# Patient Record
Sex: Female | Born: 1935
Health system: Southern US, Community
[De-identification: ages and names within clinical notes are randomized; demographics above are authoritative.]

## PROBLEM LIST (undated history)

## (undated) DIAGNOSIS — K5790 Diverticulosis of intestine, part unspecified, without perforation or abscess without bleeding: Secondary | ICD-10-CM

## (undated) DIAGNOSIS — I10 Essential (primary) hypertension: Secondary | ICD-10-CM

## (undated) DIAGNOSIS — K922 Gastrointestinal hemorrhage, unspecified: Secondary | ICD-10-CM

## (undated) DIAGNOSIS — K649 Unspecified hemorrhoids: Secondary | ICD-10-CM

## (undated) DIAGNOSIS — D649 Anemia, unspecified: Secondary | ICD-10-CM

## (undated) HISTORY — PX: CHOLECYSTECTOMY: SHX55

---

## 2001-03-13 ENCOUNTER — Ambulatory Visit (HOSPITAL_COMMUNITY): Admission: RE | Admit: 2001-03-13 | Discharge: 2001-03-13 | Payer: Self-pay | Admitting: Family Medicine

## 2001-03-13 ENCOUNTER — Encounter: Payer: Self-pay | Admitting: Family Medicine

## 2001-03-14 ENCOUNTER — Encounter: Payer: Self-pay | Admitting: Family Medicine

## 2001-03-14 ENCOUNTER — Ambulatory Visit (HOSPITAL_COMMUNITY): Admission: RE | Admit: 2001-03-14 | Discharge: 2001-03-14 | Payer: Self-pay | Admitting: Family Medicine

## 2002-11-29 ENCOUNTER — Inpatient Hospital Stay (HOSPITAL_COMMUNITY): Admission: EM | Admit: 2002-11-29 | Discharge: 2002-12-02 | Payer: Self-pay | Admitting: Internal Medicine

## 2002-11-29 ENCOUNTER — Encounter: Payer: Self-pay | Admitting: Internal Medicine

## 2002-11-30 ENCOUNTER — Encounter: Payer: Self-pay | Admitting: Cardiovascular Disease

## 2002-11-30 ENCOUNTER — Encounter: Payer: Self-pay | Admitting: Internal Medicine

## 2011-07-16 ENCOUNTER — Emergency Department (HOSPITAL_COMMUNITY)
Admission: EM | Admit: 2011-07-16 | Discharge: 2011-07-16 | Disposition: A | Discharge status: Home / Self Care | Payer: Medicare PPO | Attending: Emergency Medicine | Admitting: Emergency Medicine

## 2011-07-16 ENCOUNTER — Encounter: Payer: Self-pay | Admitting: *Deleted

## 2011-07-16 ENCOUNTER — Other Ambulatory Visit: Payer: Self-pay

## 2011-07-16 DIAGNOSIS — R42 Dizziness and giddiness: Secondary | ICD-10-CM

## 2011-07-16 DIAGNOSIS — D649 Anemia, unspecified: Secondary | ICD-10-CM

## 2011-07-16 DIAGNOSIS — M2550 Pain in unspecified joint: Secondary | ICD-10-CM | POA: Insufficient documentation

## 2011-07-16 DIAGNOSIS — I4949 Other premature depolarization: Secondary | ICD-10-CM | POA: Insufficient documentation

## 2011-07-16 DIAGNOSIS — R0602 Shortness of breath: Secondary | ICD-10-CM | POA: Insufficient documentation

## 2011-07-16 DIAGNOSIS — I10 Essential (primary) hypertension: Secondary | ICD-10-CM | POA: Insufficient documentation

## 2011-07-16 HISTORY — DX: Anemia, unspecified: D64.9

## 2011-07-16 HISTORY — DX: Essential (primary) hypertension: I10

## 2011-07-16 LAB — CBC
HCT: 26.1 % — ABNORMAL LOW (ref 36.0–46.0)
Hemoglobin: 8.6 g/dL — ABNORMAL LOW (ref 12.0–15.0)
MCH: 30.5 pg (ref 26.0–34.0)
MCHC: 33 g/dL (ref 30.0–36.0)
MCV: 92.6 fL (ref 78.0–100.0)
Platelets: 341 10*3/uL (ref 150–400)
RBC: 2.82 MIL/uL — ABNORMAL LOW (ref 3.87–5.11)
RDW: 14.3 % (ref 11.5–15.5)
WBC: 5.7 K/uL (ref 4.0–10.5)

## 2011-07-16 LAB — BASIC METABOLIC PANEL WITH GFR
BUN: 27 mg/dL — ABNORMAL HIGH (ref 6–23)
CO2: 27 meq/L (ref 19–32)
Chloride: 100 meq/L (ref 96–112)
GFR calc Af Amer: 43 mL/min — ABNORMAL LOW (ref >90)
Potassium: 4.2 meq/L (ref 3.5–5.1)

## 2011-07-16 LAB — TROPONIN I: Troponin I: <0.3 ng/mL (ref <0.30)

## 2011-07-16 LAB — BASIC METABOLIC PANEL
Calcium: 9.9 mg/dL (ref 8.4–10.5)
Creatinine, Ser: 1.27 mg/dL — ABNORMAL HIGH (ref 0.50–1.10)
GFR calc non Af Amer: 37 mL/min — ABNORMAL LOW (ref >90)
Glucose, Bld: 98 mg/dL (ref 70–99)
Sodium: 136 mEq/L (ref 135–145)

## 2011-07-16 NOTE — Discharge Instructions (Signed)
 Dizziness Dizziness is a common problem. It is a feeling of unsteadiness or lightheadedness. You may feel like you are about to faint. Dizziness can lead to injury if you stumble or fall. A person of any age group can suffer from dizziness, but dizziness is more common in older adults. CAUSES  Dizziness can be caused by many different things, including:  Middle ear problems.   Standing for too long.   Infections.   An allergic reaction.   Aging.   An emotional response to something, such as the sight of blood.   Side effects of medicines.   Fatigue.   Problems with circulation or blood pressure.   Excess use of alcohol, medicines, or illegal drug use.   Breathing too fast (hyperventilation).   An arrhythmia or problems with your heart rhythm.   Anemia or a low blood count.   Pregnancy.   Vomiting, diarrhea, fever, or other illnesses that cause dehydration.   Diseases or conditions such as Parkinson's disease, high blood pressure (hypertension), diabetes, and thyroid problems.   Exposure to extreme heat.  DIAGNOSIS  To find the cause of your dizziness, your caregiver may do a physical exam, lab tests, radiologic imaging scans, or an electrocardiography test (ECG).  TREATMENT  Treatment of dizziness depends on the cause of your symptoms and can vary greatly. HOME CARE INSTRUCTIONS   Drink enough fluids to keep your urine clear or pale yellow. This is especially important in very hot weather. In the elderly, it is also important in cold weather.   If your dizziness is caused by medicines, take them exactly as directed. When taking blood pressure medicines, it is especially important to get up slowly.   Rise slowly from chairs and steady yourself until you feel okay.   In the morning, first sit up on the side of the bed. When this seems okay, stand slowly while holding onto something until you know your balance is fine.   If you need to stand in one place for a long  time, be sure to move your legs often. Tighten and relax the muscles in your legs while standing.   If dizziness continues to be a problem, have someone stay with you for a day or two. Do this until you feel you are well enough to stay alone. Have the person call your caregiver if he or she notices changes in you that are concerning.   Do not drive or use heavy machinery if you feel dizzy.  SEEK IMMEDIATE MEDICAL CARE IF:   Your dizziness or lightheadedness gets worse.   You feel nauseous or vomit.   You develop problems with talking, walking, weakness, or using your arms, hands, or legs.   You are not thinking clearly or you have difficulty forming sentences. It may take a friend or family member to determine if your thinking is normal.   You develop chest pain, abdominal pain, shortness of breath, or sweating.   Your vision changes.   You notice any bleeding.   You have side effects from medicine that seems to be getting worse rather than better.  MAKE SURE YOU:   Understand these instructions.   Will watch your condition.   Will get help right away if you are not doing well or get worse.  Document Released: 01/02/2001 Document Revised: 03/13/2011 Document Reviewed: 01/26/2011 [facility] Patient Information 2012 [place], [place].     Anemia, Nonspecific Your exam and blood tests show you are anemic. This means your blood (hemoglobin)  level is low. Normal hemoglobin values are 12 to 15 g/dL for females and 14 to 17 g/dL for males. Make a note of your hemoglobin level today. The hematocrit percent is also used to measure anemia. A normal hematocrit is 38% to 46% in females and 42% to 49% in males. Make a note of your hematocrit level today. CAUSES  Anemia can be due to many different causes.  Excessive bleeding from periods (in women).   Intestinal bleeding.   Poor nutrition.   Kidney, thyroid, liver, and bone marrow diseases.  SYMPTOMS  Anemia can come on suddenly  (acute). It can also come on slowly. Symptoms can include:  Minor weakness.   Dizziness.   Palpitations.   Shortness of breath.  Symptoms may be absent until half your hemoglobin is missing if it comes on slowly. Anemia due to acute blood loss from an injury or internal bleeding may require blood transfusion if the loss is severe. Hospital care is needed if you are anemic and there is significant continual blood loss. TREATMENT   Stool tests for blood (Hemoccult) and additional lab tests are often needed. This determines the best treatment.   Further checking on your condition and your response to treatment is very important. It often takes many weeks to correct anemia.  Depending on the cause, treatment can include:  Supplements of iron.   Vitamins B12 and folic acid.   Hormone medicines.If your anemia is due to bleeding, finding the cause of the blood loss is very important. This will help avoid further problems.  SEEK IMMEDIATE MEDICAL CARE IF:   You develop fainting, extreme weakness, shortness of breath, or chest pain.   You develop heavy vaginal bleeding.   You develop bloody or black, tarry stools or vomit up blood.   You develop a high fever, rash, repeated vomiting, or dehydration.  Document Released: 08/16/2004 Document Revised: 03/21/2011 Document Reviewed: 05/24/2009 [facility] Patient Information 2012 [place], [place].      Please follow up with Dr. Whitfield later this week or next week regarding your low iron levels and possibly needing further evaluation by a cardiologist.  If you develop ongoing chest pain, especially associated with fainting, sweating, vomiting, or difficulty breathing, please return for re-evaluation.

## 2011-07-16 NOTE — ED Notes (Signed)
Pt c/o "feeling foolish in the head and then it travels down to her shoulder". States that it will last a while and then go away. Pt denies pain or shortness of breath.

## 2011-07-16 NOTE — ED Provider Notes (Addendum)
History     CSN: [id number]  Arrival date & time 07/16/11  1118   First MD Initiated Contact with Patient 07/16/11 1304      Chief Complaint  Patient presents with  . Dizziness    (Consider location/radiation/quality/duration/timing/severity/associated sxs/prior treatment) HPI Comments: For several days, pt has 10-15 minutes of an unusual discomfort in left shoulder associated with feeling hot and flushed, also feels "funny in my head" and goes on to describe dizziness, but cannot say if she feels vertiginous or faint.  She reports seems to happen more in the AM, more with regular physical exertion around the house.  Not worse with movement in left shoulder . She reports the first time, she felt worse after eating breakfast.  No associated sweats, nausea, feels slightly SOB with it.  She saw a cardiologist about 1-2 years ago, referred by her PCP after having a chest fluttering while working outside getting snow off of her car.  She is unsure if she had a stress test, but seems to indicate she had possibly a U/S of heart and that all of her many tests were normal.  I am unable to locate any recent cardiology visits in [facility], except for a cardiac admission to Dr. Allyson Sabal in 2004.  I am unable to see any results from this visit, however.  She has no symptoms currently.  She denies any focal numbness, weakness, slurred speech, facial droop with her symptoms, no HA, blurred vision.  She doesn't smoke, thinks her father had an MI many years ago, but no other heart disease in siblings, children.    The history is provided by the patient and a relative.    Past Medical History  Diagnosis Date  . Hypertension   . Anemia     Past Surgical History  Procedure Date  . Cholecystectomy     Family History  Problem Relation Age of Onset  . Heart disease Father     History  Substance Use Topics  . Smoking status: Never Smoker   . Smokeless tobacco: Not on file  . Alcohol Use: No    OB History     Grav Para Term Preterm Abortions TAB SAB Ect Mult Living                  Review of Systems  Constitutional: Negative.   Respiratory: Positive for shortness of breath. Negative for choking and wheezing.   Gastrointestinal: Negative for nausea and vomiting.  Musculoskeletal: Positive for arthralgias. Negative for back pain and joint swelling.  Skin: Negative for color change, rash and wound.  Neurological: Positive for dizziness. Negative for syncope, weakness and headaches.  All other systems reviewed and are negative.    Allergies  Review of patient's allergies indicates no known allergies.  Home Medications   Current Outpatient Rx  Name Route Sig Dispense Refill  . OLMESARTAN MEDOXOMIL-HCTZ 20-12.5 MG PO TABS Oral Take 1 tablet by mouth daily.        BP 110/47  Pulse 84  Temp(Src) 97.9 F (36.6 C) (Oral)  Resp 16  Ht 5\' 5"  (1.651 m)  Wt 160 lb (72.576 kg)  BMI 26.63 kg/m2  SpO2 100%  Physical Exam  Nursing note and vitals reviewed. Constitutional: She is oriented to person, place, and time. She appears well-developed and well-nourished. No distress.  Cardiovascular: Normal rate, S1 normal and normal pulses.   Occasional extrasystoles are present.  No murmur heard. Neurological: She is alert and oriented to person, place, and  time. She has normal strength. No cranial nerve deficit or sensory deficit. GCS eye subscore is 4. GCS verbal subscore is 5. GCS motor subscore is 6.    ED Course  Procedures (including critical care time)  Labs Reviewed  CBC - Abnormal; Notable for the following:    RBC 2.82 (*)    Hemoglobin 8.6 (*)    HCT 26.1 (*)    All other components within normal limits  BASIC METABOLIC PANEL - Abnormal; Notable for the following:    BUN 27 (*)    Creatinine, Ser 1.27 (*)    GFR calc non Af Amer 37 (*)    GFR calc Af Amer 43 (*)    All other components within normal limits  TROPONIN I   No results found.   1. Dizziness   2. Anemia      ECG at time 11:40 shows NSR with PVC's at rate 95.  Normal axis, non specific ST and T wave, no old ECG's to compare.   MDM    Pt's symptoms could be a cardiac equivalent.  No pain now. She reports feeling fine, non focal neuro exam.  Will likely be ok to follow up with Dr. Regino Schultze and referred back to [facility] cardiology or whomever she saw 1-2 years ago.  Will check troponin.  Her ECG is ok, non specific, but her symptoms are quite atypical.  I doubt arthritis.        Pt's CBC reveals a Hgb of 8.6.  Pt reported to me that she takes iron, implying that she has a h/o anemia, but no prior Hgb's are available in EPIC.  Will discuss with her PCP Dr. Regino Schultze.      3:38 PM Spoke to Dr. Sherwood Gambler who will let Dr. Regino Schultze know of pt's visit and need for close follow up.  Pt feels fine and wishes to go home.      Gavin Pound. Dorell Gatlin, MD 07/16/11 1535    Pt is encouraged to drink plenty of fluids.    Gavin Pound. Special Ranes, MD 07/16/11 1538

## 2012-02-19 ENCOUNTER — Emergency Department (HOSPITAL_COMMUNITY)
Admission: EM | Admit: 2012-02-19 | Discharge: 2012-02-19 | Disposition: A | Discharge status: Home / Self Care | Payer: Medicare PPO | Attending: Emergency Medicine | Admitting: Emergency Medicine

## 2012-02-19 ENCOUNTER — Encounter (HOSPITAL_COMMUNITY): Payer: Self-pay | Admitting: Emergency Medicine

## 2012-02-19 ENCOUNTER — Emergency Department (HOSPITAL_COMMUNITY): Payer: Medicare PPO

## 2012-02-19 DIAGNOSIS — I10 Essential (primary) hypertension: Secondary | ICD-10-CM | POA: Insufficient documentation

## 2012-02-19 DIAGNOSIS — Z9089 Acquired absence of other organs: Secondary | ICD-10-CM | POA: Insufficient documentation

## 2012-02-19 DIAGNOSIS — R1031 Right lower quadrant pain: Secondary | ICD-10-CM | POA: Insufficient documentation

## 2012-02-19 DIAGNOSIS — R197 Diarrhea, unspecified: Secondary | ICD-10-CM | POA: Insufficient documentation

## 2012-02-19 DIAGNOSIS — R112 Nausea with vomiting, unspecified: Secondary | ICD-10-CM | POA: Insufficient documentation

## 2012-02-19 LAB — CBC WITH DIFFERENTIAL/PLATELET
Basophils Absolute: 0 10*3/uL (ref 0.0–0.1)
Basophils Relative: 1 % (ref 0–1)
Eosinophils Absolute: 0.4 10*3/uL (ref 0.0–0.7)
Eosinophils Relative: 6 % — ABNORMAL HIGH (ref 0–5)
HCT: 29.9 % — ABNORMAL LOW (ref 36.0–46.0)
MCH: 28.8 pg (ref 26.0–34.0)
MCHC: 32.8 g/dL (ref 30.0–36.0)
MCV: 87.9 fL (ref 78.0–100.0)
Monocytes Absolute: 0.4 10*3/uL (ref 0.1–1.0)
Monocytes Relative: 6 % (ref 3–12)
Neutro Abs: 2.3 10*3/uL (ref 1.7–7.7)
RDW: 14.9 % (ref 11.5–15.5)

## 2012-02-19 LAB — URINALYSIS, ROUTINE W REFLEX MICROSCOPIC
Bilirubin Urine: NEGATIVE
Glucose, UA: NEGATIVE mg/dL
Ketones, ur: NEGATIVE mg/dL
Protein, ur: NEGATIVE mg/dL

## 2012-02-19 LAB — BASIC METABOLIC PANEL
BUN: 18 mg/dL (ref 6–23)
Calcium: 9.4 mg/dL (ref 8.4–10.5)
Creatinine, Ser: 0.92 mg/dL (ref 0.50–1.10)
GFR calc Af Amer: 64 mL/min — ABNORMAL LOW (ref >90)

## 2012-02-19 LAB — URINE MICROSCOPIC-ADD ON

## 2012-02-19 MED ORDER — SODIUM CHLORIDE 0.9 % IV SOLN
Freq: Once | INTRAVENOUS | Status: AC
  Administered 2012-02-19: 05:00:00 via INTRAVENOUS

## 2012-02-19 MED ORDER — ONDANSETRON HCL 4 MG PO TABS: 4.0000 mg | ORAL_TABLET | Freq: Four times a day (QID) | ORAL | Status: AC

## 2012-02-19 MED ORDER — PANTOPRAZOLE SODIUM 40 MG IV SOLR
INTRAVENOUS | Status: AC
  Administered 2012-02-19: 40 mg via INTRAVENOUS
  Filled 2012-02-19: qty 40

## 2012-02-19 MED ORDER — ONDANSETRON HCL 4 MG/2ML IJ SOLN
4.0000 mg | Freq: Once | INTRAMUSCULAR | Status: AC
  Administered 2012-02-19: 4 mg via INTRAVENOUS
  Filled 2012-02-19: qty 2

## 2012-02-19 MED ORDER — MORPHINE SULFATE 2 MG/ML IJ SOLN
2.0000 mg | Freq: Once | INTRAMUSCULAR | Status: AC
  Administered 2012-02-19: 2 mg via INTRAVENOUS
  Filled 2012-02-19: qty 1

## 2012-02-19 MED ORDER — PANTOPRAZOLE SODIUM 40 MG IV SOLR
40.0000 mg | Freq: Once | INTRAVENOUS | Status: AC
  Administered 2012-02-19: 40 mg via INTRAVENOUS

## 2012-02-19 NOTE — ED Notes (Signed)
States that she also feels dizzy at time.  States that she had her last BM today with diarrhea x2.

## 2012-02-19 NOTE — ED Provider Notes (Signed)
History     CSN: [id number]  Arrival date & time 02/19/12  0424   First MD Initiated Contact with Patient 02/19/12 [phone]      Chief Complaint  Patient presents with  . Abdominal Pain  . Nausea  . Emesis    (Consider location/radiation/quality/duration/timing/severity/associated sxs/prior treatment) HPI  Lynn Weaver is a 78 y.o. female brought in by ambulance, who presents to the Emergency Department complaining of RLQ abdominal pain that woke her from sleep associated with nausea, vomiting and feeling faint.Yesterday she had diarrhea x 4 times. Denies fever, chills, shortness of breath, chest pain.  PCP Dr. Regino Schultze  Past Medical History  Diagnosis Date  . Hypertension   . Anemia     Past Surgical History  Procedure Date  . Cholecystectomy     Family History  Problem Relation Age of Onset  . Heart disease Father     History  Substance Use Topics  . Smoking status: Never Smoker   . Smokeless tobacco: Not on file  . Alcohol Use: No    OB History    Grav Para Term Preterm Abortions TAB SAB Ect Mult Living                  Review of Systems  Constitutional: Negative for fever.       10 Systems reviewed and are negative for acute change except as noted in the HPI.  HENT: Negative for congestion.   Eyes: Negative for discharge and redness.  Respiratory: Negative for cough and shortness of breath.   Cardiovascular: Negative for chest pain.  Gastrointestinal: Positive for nausea, vomiting, abdominal pain and diarrhea.  Musculoskeletal: Negative for back pain.  Skin: Negative for rash.  Neurological: Negative for syncope, numbness and headaches.  Psychiatric/Behavioral:       No behavior change.    Allergies  Review of patient's allergies indicates no known allergies.  Home Medications   Current Outpatient Rx  Name Route Sig Dispense Refill  . OLMESARTAN MEDOXOMIL-HCTZ 20-12.5 MG PO TABS Oral Take 1 tablet by mouth daily.        There were no vitals  taken for this visit.  Physical Exam  Nursing note and vitals reviewed. Constitutional: She is oriented to person, place, and time. She appears well-developed and well-nourished.       Awake, alert, nontoxic appearance.  HENT:  Head: Atraumatic.  Right Ear: External ear normal.  Left Ear: External ear normal.  Nose: Nose normal.  Mouth/Throat: Oropharynx is clear and moist.       edentulous  Eyes: Conjunctivae and EOM are normal. Pupils are equal, round, and reactive to light. Right eye exhibits no discharge. Left eye exhibits no discharge.  Neck: Neck supple.  Cardiovascular: Normal heart sounds.   Pulmonary/Chest: Effort normal and breath sounds normal. She exhibits no tenderness.  Abdominal: Soft. Bowel sounds are normal. There is no tenderness. There is no rebound.  Musculoskeletal: She exhibits no tenderness.       Baseline ROM, no obvious new focal weakness.  Neurological: She is alert and oriented to person, place, and time.       Mental status and motor strength appears baseline for patient and situation.  Skin: No rash noted.  Psychiatric: She has a normal mood and affect.    ED Course  Procedures (including critical care time) Results for orders placed during the hospital encounter of 02/19/12  CBC WITH DIFFERENTIAL      Component Value Range   WBC 6.6  4.0 - 10.5 K/uL   RBC 3.40 (*) 3.87 - 5.11 MIL/uL   Hemoglobin 9.8 (*) 12.0 - 15.0 g/dL   HCT 09.8 (*) 11.9 - 14.7 %   MCV 87.9  78.0 - 100.0 fL   MCH 28.8  26.0 - 34.0 pg   MCHC 32.8  30.0 - 36.0 g/dL   RDW 82.9  56.2 - 13.0 %   Platelets 312  150 - 400 K/uL   Neutrophils Relative 35 (*) 43 - 77 %   Neutro Abs 2.3  1.7 - 7.7 K/uL   Lymphocytes Relative 52 (*) 12 - 46 %   Lymphs Abs 3.4  0.7 - 4.0 K/uL   Monocytes Relative 6  3 - 12 %   Monocytes Absolute 0.4  0.1 - 1.0 K/uL   Eosinophils Relative 6 (*) 0 - 5 %   Eosinophils Absolute 0.4  0.0 - 0.7 K/uL   Basophils Relative 1  0 - 1 %   Basophils Absolute 0.0   0.0 - 0.1 K/uL  BASIC METABOLIC PANEL      Component Value Range   Sodium 140  135 - 145 mEq/L   Potassium 3.6  3.5 - 5.1 mEq/L   Chloride 103  96 - 112 mEq/L   CO2 26  19 - 32 mEq/L   Glucose, Bld 133 (*) 70 - 99 mg/dL   BUN 18  6 - 23 mg/dL   Creatinine, Ser 8.65  0.50 - 1.10 mg/dL   Calcium 9.4  8.4 - 78.4 mg/dL   GFR calc non Af Amer 55 (*) >90 mL/min   GFR calc Af Amer 64 (*) >90 mL/min    . Date: 02/19/2012  0435  Rate: 75  Rhythm: normal sinus rhythm  QRS Axis: normal  Intervals: normal  ST/T Wave abnormalities: normal  Conduction Disutrbances:right bundle branch block  Narrative Interpretation:   Old EKG Reviewed: changes noted c/w 07/16/11 RBBB is new.  Dg Abd Acute W/chest  02/19/2012  *RADIOLOGY REPORT*  Clinical Data: Abdominal pain, emesis.  ACUTE ABDOMEN SERIES (ABDOMEN 2 VIEW & CHEST 1 VIEW)  Comparison: None.  Findings: Heart size upper normal.  Mild senescent changes without focal consolidation.  Mild right hemidiaphragm elevation.  High attenuation over the pelvis likely reflects calcified fibroids. Nonobstructive bowel gas pattern.  No acute osseous finding.  No free intraperitoneal air.  IMPRESSION: Nonobstructive bowel gas pattern.  Original Report Authenticated By: Waneta Martins, M.D.     MDM  Patient with illness that began with diarrhea yesterday and development of RLQ abdominal pain this morning associated with nausea and vomiting.Given IVF, antiemetic and analgesic with relief. Xrays without acute findings. Dx testing d/w pt and daughter.  Questions answered.  Verb understanding, agreeable to d/c home with outpt f/u. Pt feels improved after observation and/or treatment in ED.Pt stable in ED with no significant deterioration in condition.The patient appears reasonably screened and/or stabilized for discharge and I doubt any other medical condition or other [facility] requiring further screening, evaluation, or treatment in the ED at this time prior to  discharge.  MDM Reviewed: nursing note and vitals Interpretation: labs and x-ray            Nicoletta Dress. Colon Branch, MD 02/19/12 [phone]

## 2012-02-19 NOTE — ED Notes (Signed)
States she woke up and started having right lower quadrant abdominal pain with nausea and felt like she was going to faint.  Vomited x1 at home and x1 on presentation to the ER.  States she does feel better when she vomits.

## 2012-05-19 ENCOUNTER — Encounter (HOSPITAL_COMMUNITY): Payer: Self-pay

## 2012-05-23 NOTE — Patient Instructions (Addendum)
22 Lynn Weaver  05/23/2012   Your procedure is scheduled on:  05/29/12  Report to [facility] at 1120 AM.  Call this number if you have problems the morning of surgery: [phone]   Remember:   Do not eat food:After Midnight.  May have clear liquids:until Midnight .  Clear liquids include soda, tea, black coffee, apple or grape juice, broth.  Take these medicines the morning of surgery with A SIP OF WATER: benicar   Do not wear jewelry, make-up or nail polish.  Do not wear lotions, powders, or perfumes. You may wear deodorant.  Do not shave 48 hours prior to surgery. Men may shave face and neck.  Do not bring valuables to the hospital.  Contacts, dentures or bridgework may not be worn into surgery.  Leave suitcase in the car. After surgery it may be brought to your room.  For patients admitted to the hospital, checkout time is 11:00 AM the day of discharge.   Patients discharged the day of surgery will not be allowed to drive home.  Name and phone number of your driver: family  Special Instructions: N/A   Please read over the following fact sheets that you were given: Anesthesia Post-op Instructions and Care and Recovery After Surgery   PATIENT INSTRUCTIONS POST-ANESTHESIA  IMMEDIATELY FOLLOWING SURGERY:  Do not drive or operate machinery for the first twenty four hours after surgery.  Do not make any important decisions for twenty four hours after surgery or while taking narcotic pain medications or sedatives.  If you develop intractable nausea and vomiting or a severe headache please notify your doctor immediately.  FOLLOW-UP:  Please make an appointment with your surgeon as instructed. You do not need to follow up with anesthesia unless specifically instructed to do so.  WOUND CARE INSTRUCTIONS (if applicable):  Keep a dry clean dressing on the anesthesia/puncture wound site if there is drainage.  Once the wound has quit draining you may leave it open to air.  Generally you should  leave the bandage intact for twenty four hours unless there is drainage.  If the epidural site drains for more than 36-48 hours please call the anesthesia department.  QUESTIONS?:  Please feel free to call your physician or the hospital operator if you have any questions, and they will be happy to assist you.      Cataract Surgery  A cataract is a clouding of the lens of the eye. When a lens becomes cloudy, vision is reduced based on the degree and nature of the clouding. Surgery may be needed to improve vision. Surgery removes the cloudy lens and usually replaces it with a substitute lens (intraocular lens, IOL). LET YOUR EYE DOCTOR KNOW ABOUT:  Allergies to food or medicine.  Medicines taken including herbs, eyedrops, over-the-counter medicines, and creams.  Use of steroids (by mouth or creams).  Previous problems with anesthetics or numbing medicine.  History of bleeding problems or blood clots.  Previous surgery.  Other health problems, including diabetes and kidney problems.  Possibility of pregnancy, if this applies. RISKS AND COMPLICATIONS  Infection.  Inflammation of the eyeball (endophthalmitis) that can spread to both eyes (sympathetic ophthalmia).  Poor wound healing.  If an IOL is inserted, it can later fall out of proper position. This is very uncommon.  Clouding of the part of your eye that holds an IOL in place. This is called an "after-cataract." These are uncommon, but easily treated. BEFORE THE PROCEDURE  Do not eat or drink  anything except small amounts of water for 8 to 12 before your surgery, or as directed by your caregiver.  Unless you are told otherwise, continue any eyedrops you have been prescribed.  Talk to your primary caregiver about all other medicines that you take (both prescription and non-prescription). In some cases, you may need to stop or change medicines near the time of your surgery. This is most important if you are taking  blood-thinning medicine.Do not stop medicines unless you are told to do so.  Arrange for someone to drive you to and from the procedure.  Do not put contact lenses in either eye on the day of your surgery. PROCEDURE There is more than one method for safely removing a cataract. Your doctor can explain the differences and help determine which is best for you. Phacoemulsification surgery is the most common form of cataract surgery.  An injection is given behind the eye or eyedrops are given to make this a painless procedure.  A small cut (incision) is made on the edge of the clear, dome-shaped surface that covers the front of the eye (cornea).  A tiny probe is painlessly inserted into the eye. This device gives off ultrasound waves that soften and break up the cloudy center of the lens. This makes it easier for the cloudy lens to be removed by suction.  An IOL may be implanted.  The normal lens of the eye is covered by a clear capsule. Part of that capsule is intentionally left in the eye to support the IOL.  Your surgeon may or may not use stitches to close the incision. There are other forms of cataract surgery that require a larger incision and stiches to close the eye. This approach is taken in cases where the doctor feels that the cataract cannot be easily removed using phacoemulsification. AFTER THE PROCEDURE  When an IOL is implanted, it does not need care. It becomes a permanent part of your eye and cannot be seen or felt.  Your doctor will schedule follow-up exams to check on your progress.  Review your other medicines with your doctor to see which can be resumed after surgery.  Use eyedrops or take medicine as prescribed by your doctor. Document Released: 06/28/2011 Document Revised: 10/01/2011 Document Reviewed: 06/28/2011 [facility] Patient Information 2013 [place].

## 2012-05-26 ENCOUNTER — Encounter (HOSPITAL_COMMUNITY): Payer: Self-pay

## 2012-05-26 ENCOUNTER — Encounter (HOSPITAL_COMMUNITY)
Admission: RE | Admit: 2012-05-26 | Discharge: 2012-05-26 | Disposition: A | Discharge status: Home / Self Care | Payer: Medicare PPO | Source: Ambulatory Visit | Attending: Ophthalmology | Admitting: Ophthalmology

## 2012-05-26 LAB — BASIC METABOLIC PANEL
BUN: 18 mg/dL (ref 6–23)
CO2: 23 mEq/L (ref 19–32)
Chloride: 101 mEq/L (ref 96–112)
Creatinine, Ser: 1.04 mg/dL (ref 0.50–1.10)
GFR calc Af Amer: 55 mL/min — ABNORMAL LOW (ref >90)
Potassium: 4.1 mEq/L (ref 3.5–5.1)

## 2012-05-28 MED ORDER — LIDOCAINE HCL (PF) 1 % IJ SOLN
INTRAMUSCULAR | Status: AC
  Filled 2012-05-28: qty 2

## 2012-05-28 MED ORDER — LIDOCAINE HCL 3.5 % OP GEL
OPHTHALMIC | Status: AC
  Filled 2012-05-28: qty 5

## 2012-05-28 MED ORDER — CYCLOPENTOLATE-PHENYLEPHRINE 0.2-1 % OP SOLN
OPHTHALMIC | Status: AC
  Filled 2012-05-28: qty 2

## 2012-05-28 MED ORDER — TETRACAINE HCL 0.5 % OP SOLN
OPHTHALMIC | Status: AC
  Filled 2012-05-28: qty 2

## 2012-05-28 MED ORDER — PHENYLEPHRINE HCL 2.5 % OP SOLN
OPHTHALMIC | Status: AC
  Filled 2012-05-28: qty 2

## 2012-05-28 MED ORDER — NEOMYCIN-POLYMYXIN-DEXAMETH 3.5-10000-0.1 OP OINT
TOPICAL_OINTMENT | OPHTHALMIC | Status: AC
  Filled 2012-05-28: qty 3.5

## 2012-05-29 ENCOUNTER — Ambulatory Visit (HOSPITAL_COMMUNITY): Payer: Medicare PPO | Admitting: Anesthesiology

## 2012-05-29 ENCOUNTER — Ambulatory Visit (HOSPITAL_COMMUNITY)
Admission: RE | Admit: 2012-05-29 | Discharge: 2012-05-29 | Disposition: A | Discharge status: Home / Self Care | Payer: Medicare PPO | Source: Ambulatory Visit | Attending: Ophthalmology | Admitting: Ophthalmology

## 2012-05-29 ENCOUNTER — Encounter (HOSPITAL_COMMUNITY): Payer: Self-pay | Admitting: Ophthalmology

## 2012-05-29 ENCOUNTER — Encounter (HOSPITAL_COMMUNITY)
Admission: RE | Disposition: A | Discharge status: Home / Self Care | Payer: Self-pay | Source: Ambulatory Visit | Attending: Ophthalmology

## 2012-05-29 ENCOUNTER — Encounter (HOSPITAL_COMMUNITY): Payer: Self-pay | Admitting: Anesthesiology

## 2012-05-29 DIAGNOSIS — H2589 Other age-related cataract: Secondary | ICD-10-CM | POA: Insufficient documentation

## 2012-05-29 DIAGNOSIS — Z01812 Encounter for preprocedural laboratory examination: Secondary | ICD-10-CM | POA: Insufficient documentation

## 2012-05-29 DIAGNOSIS — I10 Essential (primary) hypertension: Secondary | ICD-10-CM | POA: Insufficient documentation

## 2012-05-29 HISTORY — PX: CATARACT EXTRACTION W/PHACO: SHX586

## 2012-05-29 SURGERY — PHACOEMULSIFICATION, CATARACT, WITH IOL INSERTION
Anesthesia: Monitor Anesthesia Care | Site: Eye | Laterality: Right | Wound class: Clean

## 2012-05-29 MED ORDER — NEOMYCIN-POLYMYXIN-DEXAMETH 0.1 % OP OINT
TOPICAL_OINTMENT | OPHTHALMIC | Status: DC | PRN
  Administered 2012-05-29: 1 via OPHTHALMIC

## 2012-05-29 MED ORDER — LIDOCAINE HCL (PF) 1 % IJ SOLN
INTRAMUSCULAR | Status: DC | PRN
  Administered 2012-05-29: .4 mL

## 2012-05-29 MED ORDER — MIDAZOLAM HCL 2 MG/2ML IJ SOLN
1.0000 mg | INTRAMUSCULAR | Status: DC | PRN
  Administered 2012-05-29: 2 mg via INTRAVENOUS

## 2012-05-29 MED ORDER — FENTANYL CITRATE 0.05 MG/ML IJ SOLN: 25.0000 ug | INTRAMUSCULAR | Status: DC | PRN

## 2012-05-29 MED ORDER — BSS IO SOLN
INTRAOCULAR | Status: DC | PRN
  Administered 2012-05-29: 13:00:00

## 2012-05-29 MED ORDER — LIDOCAINE HCL 3.5 % OP GEL
1.0000 "application " | Freq: Once | OPHTHALMIC | Status: AC
  Administered 2012-05-29: 1 via OPHTHALMIC

## 2012-05-29 MED ORDER — LIDOCAINE 3.5 % OP GEL OPTIME - NO CHARGE
OPHTHALMIC | Status: DC | PRN
  Administered 2012-05-29: 1 [drp] via OPHTHALMIC

## 2012-05-29 MED ORDER — EPINEPHRINE HCL 1 MG/ML IJ SOLN
INTRAMUSCULAR | Status: AC
  Filled 2012-05-29: qty 1

## 2012-05-29 MED ORDER — LACTATED RINGERS IV SOLN
INTRAVENOUS | Status: DC
  Administered 2012-05-29: 13:00:00 via INTRAVENOUS

## 2012-05-29 MED ORDER — ONDANSETRON HCL 4 MG/2ML IJ SOLN: 4.0000 mg | Freq: Once | INTRAMUSCULAR | Status: DC | PRN

## 2012-05-29 MED ORDER — PROVISC 10 MG/ML IO SOLN
INTRAOCULAR | Status: DC | PRN
  Administered 2012-05-29: 8.5 mg via INTRAOCULAR

## 2012-05-29 MED ORDER — PHENYLEPHRINE HCL 2.5 % OP SOLN
1.0000 [drp] | OPHTHALMIC | Status: AC
  Administered 2012-05-29 (×3): 1 [drp] via OPHTHALMIC

## 2012-05-29 MED ORDER — POVIDONE-IODINE 5 % OP SOLN
OPHTHALMIC | Status: DC | PRN
  Administered 2012-05-29: 1 via OPHTHALMIC

## 2012-05-29 MED ORDER — TETRACAINE HCL 0.5 % OP SOLN
1.0000 [drp] | OPHTHALMIC | Status: AC
  Administered 2012-05-29 (×3): 1 [drp] via OPHTHALMIC

## 2012-05-29 MED ORDER — BSS IO SOLN
INTRAOCULAR | Status: DC | PRN
  Administered 2012-05-29: 15 mL via INTRAOCULAR

## 2012-05-29 MED ORDER — MIDAZOLAM HCL 2 MG/2ML IJ SOLN
INTRAMUSCULAR | Status: AC
  Filled 2012-05-29: qty 2

## 2012-05-29 MED ORDER — CYCLOPENTOLATE-PHENYLEPHRINE 0.2-1 % OP SOLN
1.0000 [drp] | OPHTHALMIC | Status: AC
  Administered 2012-05-29 (×3): 1 [drp] via OPHTHALMIC

## 2012-05-29 SURGICAL SUPPLY — 32 items

## 2012-05-29 NOTE — Anesthesia Preprocedure Evaluation (Addendum)
Anesthesia Evaluation  Patient identified by MRN, date of birth, ID band Patient awake    Reviewed: Allergy & Precautions, H&P , NPO status , Patient's Chart, lab work & pertinent test results  Airway Mallampati: II      Dental  (+) Edentulous Upper   Pulmonary neg pulmonary ROS,  breath sounds clear to auscultation        Cardiovascular hypertension, Pt. on medications Rhythm:Regular Rate:Normal     Neuro/Psych    GI/Hepatic negative GI ROS,   Endo/Other    Renal/GU      Musculoskeletal   Abdominal   Peds  Hematology   Anesthesia Other Findings   Reproductive/Obstetrics                           Anesthesia Physical Anesthesia Plan  ASA: II  Anesthesia Plan: MAC   Post-op Pain Management:    Induction: Intravenous  Airway Management Planned: Nasal Cannula  Additional Equipment:   Intra-op Plan:   Post-operative Plan:   Informed Consent: I have reviewed the patients History and Physical, chart, labs and discussed the procedure including the risks, benefits and alternatives for the proposed anesthesia with the patient or authorized representative who has indicated his/her understanding and acceptance.     Plan Discussed with:   Anesthesia Plan Comments:         Anesthesia Quick Evaluation

## 2012-05-29 NOTE — Anesthesia Postprocedure Evaluation (Signed)
  Anesthesia Post-op Note  Patient: Lynn Weaver  Procedure(s) Performed: Procedure(s) (LRB) with comments: CATARACT EXTRACTION PHACO AND INTRAOCULAR LENS PLACEMENT (IOC) (Right) - CDE:18.72  Patient Location: PACU and Short Stay  Anesthesia Type:MAC  Level of Consciousness: awake, alert , oriented and patient cooperative  Airway and Oxygen Therapy: Patient Spontanous Breathing  Post-op Pain: none  Post-op Assessment: Post-op Vital signs reviewed, Patient's Cardiovascular Status Stable, Respiratory Function Stable, Patent Airway, No signs of Nausea or vomiting and Pain level controlled  Post-op Vital Signs: Reviewed and stable  Complications: No apparent anesthesia complications

## 2012-05-29 NOTE — Brief Op Note (Signed)
Pre-Op Dx: Cataract OD Post-Op Dx: Cataract OD Surgeon: Raylan Hanton Anesthesia: Topical with MAC Surgery: Cataract Extraction with Intraocular lens Implant OD Implant: B&L enVista Specimen: None Complications: None 

## 2012-05-29 NOTE — H&P (Signed)
I have reviewed the H&P, the patient was re-examined, and I have identified no interval changes in medical condition and plan of care since the history and physical of record  

## 2012-05-29 NOTE — Discharge Instructions (Signed)
Lynn Weaver  05/29/2012     Instructions  1. Use medications as Instructed.  Shake well before use. Wait 5 minutes between drops.  {OPHTHALMIC ANTIBIOTICS:22167} 4 times a day x 1 week.  {OPHTHALMIC ANTI-INFLAMMATORY:22168} 2 times a day x 4 weeks.  {OPHTHALMIC STEROID:22169} 4 times a day - week 1   3 times a day - Week 2, 2 times a day- Week 3, 1 time a day - Week 4.  2. Do not rub the operative eye. Do not swim underwater for 2 weeks.  3. You may remove the clear shield and resume your normal activities the day after  Surgery. Your eyes may feel more comfortable if you wear dark glasses outside.  4. Call our office at [phone] if you have sudden change in vision, extreme redness or pain. Some fluctuation in vision is normal after surgery. If you have an emergency after hours, call Dr. Alto Denver at [phone].  5. It is important that you attend all of your follow-up appointments.        Follow-up:{follow up:32580} with Gemma Payor, MD.   Dr. Lahoma Crocker: [phone]  Dr. Lita Mains: [phone]  Dr. Alto Denver: [phone]   If you find that you cannot contact your physician, but feel that your signs and   Symptoms warrant a physician's attention, call the Emergency Room at   [phone].   Other{NA AND WUXLKGMW:10272}.

## 2012-05-29 NOTE — Transfer of Care (Signed)
Immediate Anesthesia Transfer of Care Note  Patient: Lynn Weaver  Procedure(s) Performed: Procedure(s) (LRB) with comments: CATARACT EXTRACTION PHACO AND INTRAOCULAR LENS PLACEMENT (IOC) (Right) - CDE:18.72  Patient Location: PACU and Short Stay  Anesthesia Type:MAC  Level of Consciousness: awake, alert , oriented and patient cooperative  Airway & Oxygen Therapy: Patient Spontanous Breathing  Post-op Assessment: Report given to PACU RN and Post -op Vital signs reviewed and stable  Post vital signs: Reviewed and stable  Complications: No apparent anesthesia complications

## 2012-05-30 NOTE — Op Note (Signed)
NAME:  Lynn Weaver, Lynn Weaver                  ACCOUNT NO.:  [id number]  MEDICAL RECORD NO.:  [id number]  LOCATION:  [facility]                          FACILITY:  [facility]  PHYSICIAN:  Susanne Greenhouse, MD       DATE OF BIRTH:  1925-03-03  DATE OF PROCEDURE:  05/29/2012 DATE OF DISCHARGE:  05/29/2012                              OPERATIVE REPORT   PREOPERATIVE DIAGNOSIS:  Combined cataract, right eye, diagnosis code 366.19.  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  OPERATION PERFORMED:  Phacoemulsification with posterior chamber intraocular lens implantation, right eye.  SURGEON:  Bonne Dolores. Atwood Adcock, MD  ANESTHESIA:  Topical with monitored anesthesia care and IV sedation.  OPERATIVE SUMMARY:  In the preoperative area, dilating drops were placed into the right eye.  The patient was then brought into the operating room where she was placed under general anesthesia.  The eye was then prepped and draped.  Beginning with a 75 blade, a paracentesis port was made at the surgeon's 2 o'clock position.  The anterior chamber was then filled with a 1% nonpreserved lidocaine solution with epinephrine.  This was followed by Viscoat to deepen the chamber.  A small fornix-based peritomy was performed superiorly.  Next, a single iris hook was placed through the limbus superiorly.  A 2.4-mm keratome blade was then used to make a clear corneal incision over the iris hook.  A bent cystotome needle and Utrata forceps were used to create a continuous tear capsulotomy.  Hydrodissection was performed using balanced salt solution on a fine cannula.  The lens nucleus was then removed using phacoemulsification in a quadrant cracking technique.  The cortical material was then removed with irrigation and aspiration.  The capsular bag and anterior chamber were refilled with Provisc.  The wound was widened to approximately 3 mm and a posterior chamber intraocular lens was placed into the capsular  bag without difficulty using an [employer] lens injecting system.  A single 10-0 nylon suture was then used to close the incision as well as stromal hydration.  The Provisc was removed from the anterior chamber and capsular bag with irrigation and aspiration.  At this point, the wounds were tested for leak, which were negative.  The anterior chamber remained deep and stable.  The patient tolerated the procedure well.  There were no operative complications, and she awoke from general anesthesia without problem.  No surgical specimens.  Prosthetic device used is a Bausch and Lomb posterior chamber lens, model enVista, model number MX60, power of 22.5, serial number is [id number].          ______________________________ Susanne Greenhouse, MD     KEH/MEDQ  D:  05/29/2012  T:  05/30/2012  Job:  [id number]

## 2012-06-02 ENCOUNTER — Encounter (HOSPITAL_COMMUNITY): Payer: Self-pay | Admitting: Ophthalmology

## 2013-06-23 ENCOUNTER — Encounter: Payer: Self-pay | Admitting: Gastroenterology

## 2013-06-23 ENCOUNTER — Encounter (HOSPITAL_COMMUNITY): Payer: Self-pay | Admitting: Emergency Medicine

## 2013-06-23 ENCOUNTER — Inpatient Hospital Stay (HOSPITAL_COMMUNITY)
Admission: EM | Admit: 2013-06-23 | Discharge: 2013-06-24 | DRG: 378 | Disposition: A | Discharge status: Home / Self Care | Payer: Medicare PPO | Attending: Internal Medicine | Admitting: Internal Medicine

## 2013-06-23 DIAGNOSIS — I10 Essential (primary) hypertension: Secondary | ICD-10-CM | POA: Diagnosis present

## 2013-06-23 DIAGNOSIS — K625 Hemorrhage of anus and rectum: Secondary | ICD-10-CM

## 2013-06-23 DIAGNOSIS — Z7982 Long term (current) use of aspirin: Secondary | ICD-10-CM

## 2013-06-23 DIAGNOSIS — K922 Gastrointestinal hemorrhage, unspecified: Principal | ICD-10-CM | POA: Diagnosis present

## 2013-06-23 DIAGNOSIS — Z79899 Other long term (current) drug therapy: Secondary | ICD-10-CM

## 2013-06-23 DIAGNOSIS — D62 Acute posthemorrhagic anemia: Secondary | ICD-10-CM | POA: Diagnosis present

## 2013-06-23 DIAGNOSIS — Z8249 Family history of ischemic heart disease and other diseases of the circulatory system: Secondary | ICD-10-CM

## 2013-06-23 DIAGNOSIS — R000 Tachycardia, unspecified: Secondary | ICD-10-CM

## 2013-06-23 DIAGNOSIS — D126 Benign neoplasm of colon, unspecified: Secondary | ICD-10-CM | POA: Diagnosis present

## 2013-06-23 DIAGNOSIS — D649 Anemia, unspecified: Secondary | ICD-10-CM | POA: Insufficient documentation

## 2013-06-23 DIAGNOSIS — K5900 Constipation, unspecified: Secondary | ICD-10-CM | POA: Diagnosis present

## 2013-06-23 DIAGNOSIS — Z800 Family history of malignant neoplasm of digestive organs: Secondary | ICD-10-CM

## 2013-06-23 DIAGNOSIS — K5730 Diverticulosis of large intestine without perforation or abscess without bleeding: Secondary | ICD-10-CM | POA: Diagnosis present

## 2013-06-23 DIAGNOSIS — E876 Hypokalemia: Secondary | ICD-10-CM | POA: Diagnosis present

## 2013-06-23 DIAGNOSIS — K648 Other hemorrhoids: Secondary | ICD-10-CM | POA: Diagnosis present

## 2013-06-23 LAB — CBC WITH DIFFERENTIAL/PLATELET
Basophils Relative: 0 % (ref 0–1)
Eosinophils Relative: 1 % (ref 0–5)
HCT: 22.5 % — ABNORMAL LOW (ref 36.0–46.0)
Hemoglobin: 7.1 g/dL — ABNORMAL LOW (ref 12.0–15.0)
MCHC: 31.6 g/dL (ref 30.0–36.0)
MCV: 90.7 fL (ref 78.0–100.0)
Monocytes Absolute: 0.3 10*3/uL (ref 0.1–1.0)
Monocytes Relative: 5 % (ref 3–12)
Neutro Abs: 4.2 10*3/uL (ref 1.7–7.7)

## 2013-06-23 LAB — PROTIME-INR
INR: 1.23 (ref 0.00–1.49)
Prothrombin Time: 15.2 seconds (ref 11.6–15.2)

## 2013-06-23 LAB — COMPREHENSIVE METABOLIC PANEL
Albumin: 3.1 g/dL — ABNORMAL LOW (ref 3.5–5.2)
BUN: 13 mg/dL (ref 6–23)
CO2: 25 mEq/L (ref 19–32)
Chloride: 103 mEq/L (ref 96–112)
Creatinine, Ser: 1.02 mg/dL (ref 0.50–1.10)
GFR calc non Af Amer: 48 mL/min — ABNORMAL LOW (ref >90)
Total Bilirubin: 0.3 mg/dL (ref 0.3–1.2)

## 2013-06-23 LAB — MAGNESIUM: Magnesium: 1.5 mg/dL (ref 1.5–2.5)

## 2013-06-23 LAB — OCCULT BLOOD, POC DEVICE: Fecal Occult Bld: POSITIVE — AB

## 2013-06-23 MED ORDER — MORPHINE SULFATE 2 MG/ML IJ SOLN: 2.0000 mg | INTRAMUSCULAR | Status: DC | PRN

## 2013-06-23 MED ORDER — ACETAMINOPHEN 650 MG RE SUPP: 650.0000 mg | Freq: Four times a day (QID) | RECTAL | Status: DC | PRN

## 2013-06-23 MED ORDER — LISINOPRIL 10 MG PO TABS
10.0000 mg | ORAL_TABLET | Freq: Every day | ORAL | Status: DC
  Administered 2013-06-23 – 2013-06-24 (×2): 10 mg via ORAL
  Filled 2013-06-23 (×2): qty 1

## 2013-06-23 MED ORDER — SODIUM CHLORIDE 0.9 % IV SOLN
INTRAVENOUS | Status: DC
  Administered 2013-06-24: 08:00:00 via INTRAVENOUS

## 2013-06-23 MED ORDER — SODIUM CHLORIDE 0.9 % IV SOLN: INTRAVENOUS | Status: DC

## 2013-06-23 MED ORDER — BISACODYL 5 MG PO TBEC
10.0000 mg | DELAYED_RELEASE_TABLET | Freq: Once | ORAL | Status: AC
  Administered 2013-06-23: 10 mg via ORAL
  Filled 2013-06-23: qty 2

## 2013-06-23 MED ORDER — PEG 3350-KCL-NA BICARB-NACL 420 G PO SOLR
4000.0000 mL | Freq: Once | ORAL | Status: AC
  Administered 2013-06-23: 4000 mL via ORAL
  Filled 2013-06-23: qty 4000

## 2013-06-23 MED ORDER — ACETAMINOPHEN 325 MG PO TABS: 650.0000 mg | ORAL_TABLET | Freq: Four times a day (QID) | ORAL | Status: DC | PRN

## 2013-06-23 MED ORDER — ONDANSETRON HCL 4 MG/2ML IJ SOLN: 4.0000 mg | Freq: Three times a day (TID) | INTRAMUSCULAR | Status: DC | PRN

## 2013-06-23 MED ORDER — POTASSIUM CHLORIDE 20 MEQ/15ML (10%) PO LIQD
40.0000 meq | Freq: Once | ORAL | Status: AC
  Administered 2013-06-23: 40 meq via ORAL
  Filled 2013-06-23: qty 30

## 2013-06-23 MED ORDER — PANTOPRAZOLE SODIUM 40 MG PO TBEC
40.0000 mg | DELAYED_RELEASE_TABLET | Freq: Every day | ORAL | Status: DC
  Administered 2013-06-23: 40 mg via ORAL
  Filled 2013-06-23 (×2): qty 1

## 2013-06-23 MED ORDER — ONDANSETRON HCL 4 MG/2ML IJ SOLN: 4.0000 mg | Freq: Four times a day (QID) | INTRAMUSCULAR | Status: DC | PRN

## 2013-06-23 MED ORDER — SODIUM CHLORIDE 0.9 % IV SOLN
1000.0000 mL | Freq: Once | INTRAVENOUS | Status: AC
  Administered 2013-06-23: 1000 mL via INTRAVENOUS

## 2013-06-23 MED ORDER — HYDROCORTISONE ACETATE 25 MG RE SUPP
25.0000 mg | Freq: Two times a day (BID) | RECTAL | Status: DC
  Administered 2013-06-23: 25 mg via RECTAL
  Filled 2013-06-23 (×6): qty 1

## 2013-06-23 MED ORDER — POTASSIUM CHLORIDE CRYS ER 20 MEQ PO TBCR
60.0000 meq | EXTENDED_RELEASE_TABLET | Freq: Two times a day (BID) | ORAL | Status: DC
  Administered 2013-06-23 – 2013-06-24 (×2): 60 meq via ORAL
  Filled 2013-06-23 (×2): qty 3

## 2013-06-23 MED ORDER — HYDRALAZINE HCL 20 MG/ML IJ SOLN
5.0000 mg | Freq: Three times a day (TID) | INTRAMUSCULAR | Status: DC | PRN
  Administered 2013-06-23: 5 mg via INTRAVENOUS
  Filled 2013-06-23: qty 1

## 2013-06-23 MED ORDER — FERROUS SULFATE 325 (65 FE) MG PO TABS: 325.0000 mg | ORAL_TABLET | Freq: Every day | ORAL | Status: DC

## 2013-06-23 MED ORDER — ONDANSETRON HCL 4 MG PO TABS: 4.0000 mg | ORAL_TABLET | Freq: Four times a day (QID) | ORAL | Status: DC | PRN

## 2013-06-23 NOTE — ED Provider Notes (Signed)
This chart was scribed for Lynn Maw Ward, DO, by Yevette Edwards, ED Scribe. This patient was seen in room [facility] and the patient's care was started at 9:02 AM.  TIME SEEN: 9:02 AM  CHIEF COMPLAINT: Rectal bleeding  HPI: HPI Comments: Lynn Weaver is a 79 y.o. female, with a h/o HTN and anemia on iron, who presents to the Emergency Department complaining of two to three episodes of rectal bleeding which began yesterday. The pt was treated by her PCP yesterday for the current symptoms, and she was prescribed hydrocodone suppositories. She characterizes the rectal bleeding as first light red and then darker with later episodes, and she reports the blood is mixed with the stool.  As associated symptoms, she has experienced one episode of emesis which occurred after eating and which appeared to be food-contents, mild constipation, and one episode of dizziness this morning.  She denies any h/o previous symptoms. The pt denies experiencing pain with defecation, nausea, or diarrhea, and she denies any chest pain or abdominal pain. She had a normal colonoscopy "a long time ago." The pt takes iron tablets, but she denies taking blood thinners.    The pt's PCP is Dr. Regino Schultze at [facility]  ROS: See HPI Constitutional: no fever  Eyes: no drainage  ENT: no runny nose   Cardiovascular:  no chest pain  Resp: no SOB  GI: emesis; rectal bleeding; mild constipation; no diarrhea; no abdominal pain; no nausea GU: no dysuria Integumentary: no rash  Allergy: no hives  Musculoskeletal: no leg swelling  Neurological: dizziness; no slurred speech ROS otherwise negative  PAST MEDICAL HISTORY/PAST SURGICAL HISTORY:  Past Medical History  Diagnosis Date  . Hypertension   . Anemia     MEDICATIONS:  Prior to Admission medications   Medication Sig Start Date End Date Taking? Authorizing Provider  acetaminophen (TYLENOL) 325 MG tablet Take 650 mg by mouth every 6 (six) hours as needed. For pain     Historical Provider, MD  ferrous sulfate 325 (65 FE) MG tablet Take 325 mg by mouth daily.    Historical Provider, MD  olmesartan-hydrochlorothiazide (BENICAR HCT) 20-12.5 MG per tablet Take 1 tablet by mouth daily.      Historical Provider, MD    ALLERGIES:  No Known Allergies  SOCIAL HISTORY:  History  Substance Use Topics  . Smoking status: Never Smoker   . Smokeless tobacco: Not on file  . Alcohol Use: No    FAMILY HISTORY: Family History  Problem Relation Age of Onset  . Heart disease Father     EXAM: BP 150/102  Pulse 112  Temp(Src) 98.1 F (36.7 C) (Oral)  Resp 20  Ht 5' (1.524 m)  Wt 162 lb (73.483 kg)  BMI 31.64 kg/m2  SpO2 98% CONSTITUTIONAL: Alert and oriented and responds appropriately to questions. Well-appearing; well-nourished HEAD: Normocephalic EYES: Conjunctivae clear, PERRL; no congunctival pallor  ENT: normal nose; no rhinorrhea; moist mucous membranes; pharynx without lesions noted NECK: Supple, no meningismus, no LAD  CARD: RRR; S1 and S2 appreciated; no murmurs, no clicks, no rubs, no gallops RESP: Normal chest excursion without splinting or tachypnea; breath sounds clear and equal bilaterally; no wheezes, no rhonchi, no rales,  ABD/GI: Normal bowel sounds; non-distended; soft, non-tender, no rebound, no guarding RECTAL: normal rectal tone; non-thrombosed external hemorrhoid; no gross blood; dark stools likely secondary to taking iron tablets BACK:  The back appears normal and is non-tender to palpation, there is no CVA tenderness EXT: Normal ROM in all  joints; non-tender to palpation; no edema; normal capillary refill; no cyanosis    SKIN: Normal color for age and race; warm NEURO: Moves all extremities equally PSYCH: The patient's mood and manner are appropriate. Grooming and personal hygiene are appropriate.   MEDICAL DECISION MAKING: Patient with episode of bright red blood per rectum. She is mildly tachycardic but otherwise hemodynamically  stable. Her abdomen is soft and nontender to palpation. Will check basic labs, Hemoccult and closely monitor. Suspect patient's rectal bleeding is from her hemorrhoids.  ED PROGRESS: Patient's hemoglobin is 7.1. This is a drop from 9.6 in November of 2013. Patient's fasting is also low at 2.8. Will replace. We'll consent for blood and admit. Her INR is 1.23.  12:10 PM  Spoke with Dr. Darrick Penna with gastroenterology. We will likely perform a colonoscopy tomorrow. We'll start clear liquid diet. Will discuss with hospitalist for admission.  12:56 PM  Spoke with Dr. Rhona Leavens, hospitalist, for admission.   EKG Interpretation    Date/Time:  Tuesday June 23 2013 11:34:11 EST Ventricular Rate:  83 PR Interval:  142 QRS Duration: 122 QT Interval:  408 QTC Calculation: 479 R Axis:   -30 Text Interpretation:  Sinus rhythm with Premature atrial complexes Left axis deviation Right bundle branch block Abnormal ECG When compared with ECG of 19-Feb-2012 04:35, Premature atrial complexes are now Present Confirmed by WARD  DO, KRISTEN ([id number]) on 06/23/2013 12:29:14 PM             I personally performed the services described in this documentation, which was scribed in my presence. The recorded information has been reviewed and is accurate.      Lynn Maw Ward, DO 06/23/13 1256

## 2013-06-23 NOTE — H&P (Signed)
[facility] History and Physical  Lynn Weaver ZOX:[id number] DOB: [birth year] DOA: 06/23/2013  Referring physician:  PCP: Kirk Ruths, MD  Specialists:   Chief Complaint: BRBPR  HPI: Lynn Weaver is a delightful 79 y.o. female with a past medical history that includes hypertension and anemia presents to the emergency department with the chief complaint of bright red blood paretic numb. Information is obtained from the patient. She states that yesterday she developed "blood in my stools". She denies any abdominal pain or nausea or vomiting. She states she had 3-4 episodes of bright red blood in her stool. She denies melena. She denies shortness of breath or dizziness. She denies chest pain palpitations. She denies EtOH use or NSAID use. She reports she called her primary care provider to provide her with Cipro and suppositories. She denies any fever chills or recent sick contacts. She denies dysuria hematuria frequency or urgency. She does endorse some chronic constipation as well as anorexia but denies any unintentional weight loss. Evaluation in the emergency department yields a hemoglobin of 7.1, potassium of 2.8 and a serum glucose of 175 and fecal occult blood positive. She is hemodynamically stable. Symptoms came on suddenly have stabilized characterized as mild to moderate. We are asked to admit  Review of Systems: 10 point review of systems completed all systems are negative except as indicated in the history of present illness  Past Medical History  Diagnosis Date  . Hypertension   . Anemia    Past Surgical History  Procedure Laterality Date  . Cholecystectomy    . Colonoscopy    . Cataract extraction w/phaco  05/29/2012    Procedure: CATARACT EXTRACTION PHACO AND INTRAOCULAR LENS PLACEMENT (IOC);  Surgeon: Gemma Payor, MD;  Location: [facility];  Service: Ophthalmology;  Laterality: Right;  CDE:18.72   Social History:  reports that she has never smoked. She does not have any  smokeless tobacco history on file. She reports that she does not drink alcohol or use illicit drugs. No Known Allergies Patient lives at home alone. She is very independent with ADLs. She still drives and is active in her church.  Family History  Problem Relation Age of Onset  . Heart disease Father    family medical history reviewed and noncontributory to the admission of this elderly lady  Prior to Admission medications   Medication Sig Start Date End Date Taking? Authorizing Provider  aspirin EC 81 MG tablet Take 81 mg by mouth daily.   Yes Historical Provider, MD  ciprofloxacin (CIPRO) 500 MG tablet Take 500 mg by mouth 2 (two) times daily.   Yes Historical Provider, MD  ferrous sulfate 325 (65 FE) MG tablet Take 325 mg by mouth daily.   Yes Historical Provider, MD  hydrocortisone (ANUSOL-HC) 25 MG suppository Place 25 mg rectally 2 (two) times daily.   Yes Historical Provider, MD  lisinopril (PRINIVIL,ZESTRIL) 10 MG tablet Take 10 mg by mouth daily.   Yes Historical Provider, MD  acetaminophen (TYLENOL) 325 MG tablet Take 650 mg by mouth every 6 (six) hours as needed. For pain    Historical Provider, MD   Physical Exam: Filed Vitals:   06/23/13 1300  BP: 121/57  Pulse: 78  Temp:   Resp: 20     General:  Obese calm comfortable no acute distress  Eyes: PE RRL, EOMI, no scleral icterus  ENT: Ears are clear nose without drainage oropharynx without erythema or exudate. Mucous membranes of her mouth are slightly pale slightly dry  Neck: Supple no JVD full range of motion no lymphadenopathy  Cardiovascular: Regular rate and rhythm no murmur no gallop no rub no lower extremity edema pedal pulses are present and palpable  Respiratory: Normal effort breath sounds are clear bilaterally I hear no wheeze no rhonchi  Abdomen: Obese soft positive bowel sounds nontender throughout to palpation. No mass organomegaly noted  Skin: Skin is warm and dry there are no rashes or  lesions  Musculoskeletal: Good muscle tone. No clubbing no cyanosis  Psychiatric: Calm cooperative  Neurologic: Cranial nerves II through XII grossly intact speech is clear facial symmetry  Labs on Admission:  Basic Metabolic Panel:  Recent Labs Lab 06/23/13 0914  NA 141  K 2.8*  CL 103  CO2 25  GLUCOSE 175*  BUN 13  CREATININE 1.02  CALCIUM 8.8   Liver Function Tests:  Recent Labs Lab 06/23/13 0914  AST 17  ALT 8  ALKPHOS 52  BILITOT 0.3  PROT 6.6  ALBUMIN 3.1*   No results found for this basename: LIPASE, AMYLASE,  in the last 168 hours No results found for this basename: AMMONIA,  in the last 168 hours CBC:  Recent Labs Lab 06/23/13 0914  WBC 5.8  NEUTROABS 4.2  HGB 7.1*  HCT 22.5*  MCV 90.7  PLT 321   Cardiac Enzymes: No results found for this basename: CKTOTAL, CKMB, CKMBINDEX, TROPONINI,  in the last 168 hours  BNP (last 3 results) No results found for this basename: PROBNP,  in the last 8760 hours CBG: No results found for this basename: GLUCAP,  in the last 168 hours  Radiological Exams on Admission: No results found.  EKG: Independently reviewed the sinus rhythm with PACs, right bundle branch block.  Assessment/Plan Principal Problem:   Acute blood loss anemia: Likely related to bright red blood per rectum. Will admit to medical floor. Will transfuse 2 units of packed red blood cells. Chart review indicates hemoglobin 9.6 approximately a year ago. She does not appear to be actively bleeding therefore will hold off on getting additional CBC until the a.m. Active Problems:  BRBPR (bright red blood per rectum): Etiology unclear. Concern for GI bleed however patient reports some history of hemorrhoids. Dr. Darrick Penna with gastroenterology has been consulted. Will keep patient on clear liquids until midnight and then n.p.o. in anticipation of colonoscopy.     GI bleed: See above. Her liquids until midnight then n.p.o. Appreciate gastroenterology  assistance    Hypertension: Stable on admission. Patient's home medications include lisinopril and this has been continued    Hypokalemia: Likely related to #1. Will replete and recheck we'll check mag level as well     Dr. Darrick Penna gastroenterology Code Status: full Family Communication: niece at bedside Disposition Plan: home when ready   Time spent: 40 minutes  [facility]   If 7PM-7AM, please contact night-coverage [email or web address] Password [email or web address] 06/23/2013, 1:42 PM

## 2013-06-23 NOTE — Progress Notes (Signed)
Patient BP elevated, Dr. Rhona Leavens notified.

## 2013-06-23 NOTE — Consult Note (Addendum)
Referring Provider: Dr. Baxter Hire Ward  Primary Care Physician:  Dr. Regino Schultze Primary Gastroenterologist:  Dr. Darrick Penna   Date of Admission: 06/23/13 Date of Consultation: 06/23/13  Reason for Consultation:  Rectal Bleeding  HPI:  Lynn Weaver is a pleasant 79 year old female who presented to the ED today secondary to acute onset of rectal bleeding. Admitting Hgb 7.1. It appears that her Hgb baseline is in the 8-9 range from previous values dating back to 2012. 2 units of PRBCs have been ordered.  Onset of rectal bleeding yesterday, bright red, large amount. Painless. Went to PCP and prescribed suppositories due to possible hemorrhoids. Several episodes of bright red blood per rectum, some clots. Referred to our office for an outpatient evaluation. Spoke with family member, who recommended urgent evaluation at the ED. Patient notes rectal bleeding has tapered off since admission. Last BM just before my consultation with her today, and she denies any evidence of rectal bleeding at that time. Denies NSAIDs or aspirin powders. No change in bowel habits or upper GI symptoms. Last colonoscopy in the remote past per patient.   Past Medical History  Diagnosis Date  . Hypertension   . Anemia     Past Surgical History  Procedure Laterality Date  . Cholecystectomy    . Colonoscopy      remote past  . Cataract extraction w/phaco  05/29/2012    Procedure: CATARACT EXTRACTION PHACO AND INTRAOCULAR LENS PLACEMENT (IOC);  Surgeon: Gemma Payor, MD;  Location: [facility];  Service: Ophthalmology;  Laterality: Right;  CDE:18.72    Prior to Admission medications   Medication Sig Start Date End Date Taking? Authorizing Provider  aspirin EC 81 MG tablet Take 81 mg by mouth daily.   Yes Historical Provider, MD  ciprofloxacin (CIPRO) 500 MG tablet Take 500 mg by mouth 2 (two) times daily.   Yes Historical Provider, MD  ferrous sulfate 325 (65 FE) MG tablet Take 325 mg by mouth daily.   Yes Historical Provider, MD   hydrocortisone (ANUSOL-HC) 25 MG suppository Place 25 mg rectally 2 (two) times daily.   Yes Historical Provider, MD  lisinopril (PRINIVIL,ZESTRIL) 10 MG tablet Take 10 mg by mouth daily.   Yes Historical Provider, MD  acetaminophen (TYLENOL) 325 MG tablet Take 650 mg by mouth every 6 (six) hours as needed. For pain    Historical Provider, MD    Current Facility-Administered Medications  Medication Dose Route Frequency Provider Last Rate Last Dose  . 0.9 %  sodium chloride infusion   Intravenous Continuous Jerald Kief, MD      . acetaminophen (TYLENOL) tablet 650 mg  650 mg Oral Q6H PRN Jerald Kief, MD       Or  . acetaminophen (TYLENOL) suppository 650 mg  650 mg Rectal Q6H PRN Jerald Kief, MD      . hydrocortisone (ANUSOL-HC) suppository 25 mg  25 mg Rectal BID Jerald Kief, MD      . lisinopril (PRINIVIL,ZESTRIL) tablet 10 mg  10 mg Oral Daily Jerald Kief, MD   10 mg at 06/23/13 1536  . morphine 2 MG/ML injection 2 mg  2 mg Intravenous Q4H PRN Jerald Kief, MD      . ondansetron [facility]) tablet 4 mg  4 mg Oral Q6H PRN Jerald Kief, MD       Or  . ondansetron [facility]) injection 4 mg  4 mg Intravenous Q6H PRN Jerald Kief, MD      . pantoprazole (PROTONIX)  EC tablet 40 mg  40 mg Oral QAC breakfast [place], MD      . potassium chloride SA (K-DUR,KLOR-CON) CR tablet 60 mEq  60 mEq Oral BID Jerald Kief, MD        Allergies as of 06/23/2013  . (No Known Allergies)    Family History  Problem Relation Age of Onset  . Heart disease Father   . Colon cancer Sister     age 61    History   Social History  . Marital Status: Widowed    Spouse Name: N/A    Number of Children: N/A  . Years of Education: N/A   Occupational History  . Not on file.   Social History Main Topics  . Smoking status: Never Smoker   . Smokeless tobacco: Not on file  . Alcohol Use: No  . Drug Use: No  . Sexual Activity: Not on file   Other Topics Concern  . Not on file    Social History Narrative  . No narrative on file    Review of Systems: As mentioned in HPI.   Physical Exam: Vital signs in last 24 hours: Temp:  [97.9 F (36.6 C)-99.4 F (37.4 C)] 97.9 F (36.6 C) (12/02 1554) Pulse Rate:  [63-112] 85 (12/02 1554) Resp:  [14-21] 16 (12/02 1554) BP: (109-150)/(52-102) 149/83 mmHg (12/02 1554) SpO2:  [97 %-100 %] 98 % (12/02 1515) Weight:  [159 lb 6.3 oz (72.3 kg)-162 lb (73.483 kg)] 159 lb 6.3 oz (72.3 kg) (12/02 1413)   General:   Alert,  Well-developed, well-nourished, pleasant and cooperative in NAD Head:  Normocephalic and atraumatic. Eyes:  Sclera clear, no icterus.   Conjunctiva pink. Ears:  Normal auditory acuity. Nose:  No deformity, discharge,  or lesions. Mouth:  No deformity or lesions, dentures present. Neck:  Supple; no masses or thyromegaly. Lungs:  Clear throughout to auscultation.   No wheezes, crackles, or rhonchi. No acute distress. Heart:  S1 S2 present; no murmurs, clicks, rubs,  or gallops. Abdomen:  Soft, nontender and nondistended. Normal bowel sounds, easily reducible umbilical hernia noted.  Rectal:  Deferred until time of colonoscopy.   Msk:  Symmetrical without gross deformities. Normal posture. Extremities:  Without clubbing or edema. Neurologic:  Alert and  oriented x4;  grossly normal neurologically. Skin:  Intact without significant lesions or rashes. Cervical Nodes:  No significant cervical adenopathy. Psych:  Alert and cooperative. Normal mood and affect.  Intake/Output from previous day:   Intake/Output this shift: Total I/O In: 125 [Blood:125] Out: -   Lab Results:  Recent Labs  06/23/13 0914  WBC 5.8  HGB 7.1*  HCT 22.5*  PLT 321   BMET  Recent Labs  06/23/13 0914  NA 141  K 2.8*  CL 103  CO2 25  GLUCOSE 175*  BUN 13  CREATININE 1.02  CALCIUM 8.8   LFT  Recent Labs  06/23/13 0914  PROT 6.6  ALBUMIN 3.1*  AST 17  ALT 8  ALKPHOS 52  BILITOT 0.3   PT/INR  Recent  Labs  06/23/13 0914  LABPROT 15.2  INR 1.23   Impression: 79 year old female with acute onset of rectal bleeding and acute blood loss anemia, favor possible diverticular origin but unable to exclude other etiology. Encouragingly, bleeding has tapered off since admission to floor. 1 unit PRBCs infusing currently, with an additional unit ordered. Last colonoscopy in remote past; as of note, patient does have a positive family history of colon cancer in  her sister, diagnosed in her 103s.  Recommend lower GI evaluation via colonoscopy by Dr. Darrick Penna on Dec 3rd. Will start prep now, with tap water enemas in the morning. The risks and benefits were discussed in detail with patient, who stated understanding.   Plan: Clear liquid diet NPO after midnight Golytely 4 liters now, Dulcolax 10 mg X 2 doses during prep Tap water enemas X 2 in the morning Colonoscopy with Dr. Darrick Penna on 12/3.  Agree with PPI for GI prophylaxis.  Nira Retort, ANP-BC [facility]     LOS: 0 days    06/23/2013, 4:05 PM   REVIEWED. AGREE.

## 2013-06-23 NOTE — ED Notes (Signed)
POC occult blood obtained and resulted. EDP aware of positive result. No new orders given at this time.

## 2013-06-23 NOTE — Progress Notes (Signed)
06/23/13 1550 Notified Dr. Rhona Leavens of patient potassium level 2.8 this afternoon at 1530. Received call back from MD. Orders placed for potassium supplements and magnesium level to be checked. Earnstine Regal, RN

## 2013-06-23 NOTE — H&P (Signed)
Pt seen and examined. Agree with H&P. Pt presents with acute lower gi bleed with acute blood loss anemia. Hemodynamically stable. GI consulted. Blood tx ordered through the ED. Plans for colon tomorrow. Keep w/ clears today, NPO after midnight.

## 2013-06-23 NOTE — ED Notes (Signed)
Bright and dark red rectal bleeding starting yesterday.  Seen PCP yesterday and given a suppository.  Denies n/d, vomited x 1 last night.

## 2013-06-24 ENCOUNTER — Encounter (HOSPITAL_COMMUNITY)
Admission: EM | Disposition: A | Discharge status: Home / Self Care | Payer: Self-pay | Source: Home / Self Care | Attending: Internal Medicine

## 2013-06-24 ENCOUNTER — Encounter (HOSPITAL_COMMUNITY): Payer: Self-pay | Admitting: *Deleted

## 2013-06-24 DIAGNOSIS — K5730 Diverticulosis of large intestine without perforation or abscess without bleeding: Secondary | ICD-10-CM

## 2013-06-24 DIAGNOSIS — E876 Hypokalemia: Secondary | ICD-10-CM

## 2013-06-24 DIAGNOSIS — K648 Other hemorrhoids: Secondary | ICD-10-CM

## 2013-06-24 DIAGNOSIS — K921 Melena: Secondary | ICD-10-CM

## 2013-06-24 DIAGNOSIS — D126 Benign neoplasm of colon, unspecified: Secondary | ICD-10-CM

## 2013-06-24 HISTORY — PX: COLONOSCOPY: SHX5424

## 2013-06-24 LAB — CBC
HCT: 27.3 % — ABNORMAL LOW (ref 36.0–46.0)
Hemoglobin: 9 g/dL — ABNORMAL LOW (ref 12.0–15.0)
Platelets: 256 10*3/uL (ref 150–400)
RBC: 3.1 MIL/uL — ABNORMAL LOW (ref 3.87–5.11)
RDW: 15.6 % — ABNORMAL HIGH (ref 11.5–15.5)
WBC: 7.2 10*3/uL (ref 4.0–10.5)

## 2013-06-24 LAB — COMPREHENSIVE METABOLIC PANEL
ALT: 8 U/L (ref 0–35)
Albumin: 2.8 g/dL — ABNORMAL LOW (ref 3.5–5.2)
Alkaline Phosphatase: 47 U/L (ref 39–117)
BUN: 7 mg/dL (ref 6–23)
Creatinine, Ser: 0.92 mg/dL (ref 0.50–1.10)
GFR calc Af Amer: 64 mL/min — ABNORMAL LOW (ref >90)
Glucose, Bld: 106 mg/dL — ABNORMAL HIGH (ref 70–99)
Potassium: 3.3 mEq/L — ABNORMAL LOW (ref 3.5–5.1)
Sodium: 144 mEq/L (ref 135–145)
Total Protein: 5.7 g/dL — ABNORMAL LOW (ref 6.0–8.3)

## 2013-06-24 LAB — TYPE AND SCREEN

## 2013-06-24 SURGERY — COLONOSCOPY
Anesthesia: Moderate Sedation

## 2013-06-24 MED ORDER — MEPERIDINE HCL 100 MG/ML IJ SOLN
INTRAMUSCULAR | Status: DC | PRN
  Administered 2013-06-24 (×2): 25 mg via INTRAVENOUS

## 2013-06-24 MED ORDER — SPOT INK MARKER SYRINGE KIT
PACK | SUBMUCOSAL | Status: DC | PRN
  Administered 2013-06-24: 1 mL via SUBMUCOSAL

## 2013-06-24 MED ORDER — MIDAZOLAM HCL 5 MG/5ML IJ SOLN
INTRAMUSCULAR | Status: AC
  Filled 2013-06-24: qty 10

## 2013-06-24 MED ORDER — MEPERIDINE HCL 100 MG/ML IJ SOLN
INTRAMUSCULAR | Status: AC
  Filled 2013-06-24: qty 2

## 2013-06-24 MED ORDER — STERILE WATER FOR IRRIGATION IR SOLN
Status: DC | PRN
  Administered 2013-06-24: 09:00:00

## 2013-06-24 MED ORDER — HYDROCORTISONE ACETATE 25 MG RE SUPP: 25.0000 mg | Freq: Two times a day (BID) | RECTAL | Status: AC

## 2013-06-24 MED ORDER — MEPERIDINE HCL 100 MG/ML IJ SOLN
INTRAMUSCULAR | Status: AC
  Filled 2013-06-24: qty 1

## 2013-06-24 MED ORDER — PANTOPRAZOLE SODIUM 40 MG PO TBEC: 40.0000 mg | DELAYED_RELEASE_TABLET | Freq: Every day | ORAL | Status: DC

## 2013-06-24 MED ORDER — SIMETHICONE 40 MG/0.6ML PO SUSP
ORAL | Status: AC
  Filled 2013-06-24: qty 1.2

## 2013-06-24 MED ORDER — MIDAZOLAM HCL 5 MG/5ML IJ SOLN
INTRAMUSCULAR | Status: DC | PRN
  Administered 2013-06-24 (×2): 1 mg via INTRAVENOUS
  Administered 2013-06-24: 2 mg via INTRAVENOUS

## 2013-06-24 NOTE — Progress Notes (Signed)
Patient being d/c home with prescriptions. IV cath removed and intact. No pain/swelling at site. 

## 2013-06-24 NOTE — H&P (Signed)
  Primary Care Physician:  Kirk Ruths, MD Primary Gastroenterologist:  Dr. Darrick Penna  Pre-Procedure History & Physical: HPI:  Lynn Weaver is a 79 y.o. female here for  BRBPR.  Past Medical History  Diagnosis Date  . Hypertension   . Anemia     Past Surgical History  Procedure Laterality Date  . Cholecystectomy    . Colonoscopy      remote past  . Cataract extraction w/phaco  05/29/2012    Procedure: CATARACT EXTRACTION PHACO AND INTRAOCULAR LENS PLACEMENT (IOC);  Surgeon: Gemma Payor, MD;  Location: [facility];  Service: Ophthalmology;  Laterality: Right;  CDE:18.72    Prior to Admission medications   Medication Sig Start Date End Date Taking? Authorizing Provider  aspirin EC 81 MG tablet Take 81 mg by mouth daily.   Yes Historical Provider, MD  ciprofloxacin (CIPRO) 500 MG tablet Take 500 mg by mouth 2 (two) times daily.   Yes Historical Provider, MD  ferrous sulfate 325 (65 FE) MG tablet Take 325 mg by mouth daily.   Yes Historical Provider, MD  hydrocortisone (ANUSOL-HC) 25 MG suppository Place 25 mg rectally 2 (two) times daily.   Yes Historical Provider, MD  lisinopril (PRINIVIL,ZESTRIL) 10 MG tablet Take 10 mg by mouth daily.   Yes Historical Provider, MD  acetaminophen (TYLENOL) 325 MG tablet Take 650 mg by mouth every 6 (six) hours as needed. For pain    Historical Provider, MD    Allergies as of 06/23/2013  . (No Known Allergies)    Family History  Problem Relation Age of Onset  . Heart disease Father   . Colon cancer Sister     age 36    History   Social History  . Marital Status: Widowed    Spouse Name: N/A    Number of Children: N/A  . Years of Education: N/A   Occupational History  . Not on file.   Social History Main Topics  . Smoking status: Never Smoker   . Smokeless tobacco: Not on file  . Alcohol Use: No  . Drug Use: No  . Sexual Activity: Not on file   Other Topics Concern  . Not on file   Social History Narrative  . No narrative on  file    Review of Systems: See HPI, otherwise negative ROS   Physical Exam: BP 145/88  Pulse 71  Temp(Src) 98 F (36.7 C) (Oral)  Resp 15  Ht 5\' 6"  (1.676 m)  Wt 159 lb (72.122 kg)  BMI 25.68 kg/m2  SpO2 100% General:   Alert,  pleasant and cooperative in NAD Head:  Normocephalic and atraumatic. Neck:  Supple; Lungs:  Clear throughout to auscultation.    Heart:  Regular rate and rhythm. Abdomen:  Soft, nontender and nondistended. Normal bowel sounds, without guarding, and without rebound.   Neurologic:  Alert and  oriented x4;  grossly normal neurologically.  Impression/Plan:    BRBPR  PLAN: TCS TODAY

## 2013-06-24 NOTE — Discharge Summary (Signed)
Patient seen and examined. Agree with that as per Toya Smothers, NP.  GI bleeding appears to have resolved. She underwent colonoscopy today which revealed source of bleeding was likely diverticular versus hemorrhoidal in nature. She's been cleared for discharge by gastroenterology and will followup as an outpatient. The patient's hemoglobin is stable after transfusion of PRBC. The patient is anxious to be discharged home today. She does not have any complaints at this time.  MEMON,JEHANZEB

## 2013-06-24 NOTE — Discharge Summary (Signed)
Physician Discharge Summary  Lynn Weaver YNW:[id number] DOB: March 07, 1925 DOA: 06/23/2013  PCP: Kirk Ruths, MD  Admit date: 06/23/2013 Discharge date: 06/24/2013  Time spent: 40 minutes  Recommendations for Outpatient Follow-up:  1. Follow up with PCP 1 week for evaluation of symptoms. Recommend cbc to trend Hg and bmet to trend potassium level.  Discharge Diagnoses:  Principal Problem:   Acute blood loss anemia Active Problems:   GI bleed   Hypertension   Hypokalemia   BRBPR (bright red blood per rectum)   Discharge Condition: stable   Diet recommendation: heart healthy  Filed Weights   06/23/13 0858 06/23/13 1413 06/24/13 0752  Weight: 73.483 kg (162 lb) 72.3 kg (159 lb 6.3 oz) 72.122 kg (159 lb)    History of present illness:  Lynn Weaver is a delightful 79 y.o. female with a past medical history that includes hypertension and anemia presented to the emergency department on 06/23/13 with the chief complaint of bright red blood per rectum.  She stated that day prior she developed "blood in my stools". She denied any abdominal pain or nausea or vomiting. She stated she had 3-4 episodes of bright red blood in her stool. She denied melena. She denied shortness of breath or dizziness. She denied chest pain palpitations. She denied EtOH use or NSAID use. She reported she called her primary care provider who provided her with Cipro and suppositories. She denied any fever chills or recent sick contacts. She denied dysuria hematuria frequency or urgency. She did endorse some chronic constipation as well as anorexia but denied any unintentional weight loss. Evaluation in the emergency department yielded a hemoglobin of 7.1, potassium of 2.8 and a serum glucose of 175 and fecal occult blood positive. She was hemodynamically stable.   Hospital Course:  Acute blood loss anemia: Likely related to bright red blood per rectum secondary to diverticulosis/hemorroids per colonoscopy results.   Admitted to medical floor. Transfused 2 units of packed red blood cells. At discharge Hg 9.0 from 7.1 on admission.  Chart review indicates hemoglobin 9.6 approximately a year ago. She had no further episodes BRBPR. Recommend OP follow up with PCP 1 week for evaluation symptoms and CBC trend Hg Active Problems:  BRBPR (bright red blood per rectum): secondary to diverticulosis/hemorroinds per colonoscopy results. Dr. Darrick Penna with gastroenterology performed coloscopy 06/24/13 and opined normal mucosa in the terminal ileum.Two COLON POLYPS REMOVED  RECTAL BLEEDING AND DROP IN HB DUE TO DIVERTICULOSIS/HEMORRHOIDS. Patient tolerated well. Diet quickly advanced. Dr. Darrick Penna office will follow polyp biopsy.   GI bleed: See above. Protonix daily until follow up GI 4 months.   Hypertension: Stable on admission and remained controlled during hospitalization on home medications of lisinopril.   Hypokalemia: mild. Repleted. Recommend OP follow up BMET 1 week to trend potassium    Procedures:  none  Consultations:  Dr Darrick Penna  Discharge Exam: Filed Vitals:   06/24/13 0939  BP: 118/64  Pulse: 72  Temp: 98 F (36.7 C)  Resp: 18    General: obese calm comfortable Cardiovascular: RRR No MGR no LE edema Respiratory: normal effort BS clear bilaterally no wheeze Abdomen: soft +BS non-tender to palpation  Discharge Instructions   Future Appointments Provider Department Dept Phone   07/08/2013 8:00 AM [facility] [phone]       Medication List    STOP taking these medications       ciprofloxacin 500 MG tablet  Commonly known as:  CIPRO  TAKE these medications       acetaminophen 325 MG tablet  Commonly known as:  TYLENOL  Take 650 mg by mouth every 6 (six) hours as needed. For pain     aspirin EC 81 MG tablet  Take 81 mg by mouth daily.     ferrous sulfate 325 (65 FE) MG tablet  Take 325 mg by mouth daily.     hydrocortisone  25 MG suppository  Commonly known as:  ANUSOL-HC  Place 1 suppository (25 mg total) rectally 2 (two) times daily.     lisinopril 10 MG tablet  Commonly known as:  PRINIVIL,ZESTRIL  Take 10 mg by mouth daily.     pantoprazole 40 MG tablet  Commonly known as:  PROTONIX  Take 1 tablet (40 mg total) by mouth daily before breakfast.       No Known Allergies     Follow-up Information   Follow up with Kirk Ruths, MD. Schedule an appointment as soon as possible for a visit in 1 week. (for evaluation of symptoms recommend cbc to trend Hg. )    Specialty:  Family Medicine   Contact information:   [address] [place] [address] [phone]       Follow up with Jonette Eva, MD. Schedule an appointment as soon as possible for a visit in 3 months. (follow up appointment. )    Specialty:  Gastroenterology   Contact information:   [address] [place] [address] [phone]        The results of significant diagnostics from this hospitalization (including imaging, microbiology, ancillary and laboratory) are listed below for reference.    Significant Diagnostic Studies: No results found.  Microbiology: No results found for this or any previous visit (from the past 240 hour(s)).   Labs: Basic Metabolic Panel:  Recent Labs Lab 06/23/13 0914 06/23/13 0919 06/24/13 0637  NA 141  --  144  K 2.8*  --  3.3*  CL 103  --  111  CO2 25  --  26  GLUCOSE 175*  --  106*  BUN 13  --  7  CREATININE 1.02  --  0.92  CALCIUM 8.8  --  8.3*  MG  --  1.5  --    Liver Function Tests:  Recent Labs Lab 06/23/13 0914 06/24/13 0637  AST 17 20  ALT 8 8  ALKPHOS 52 47  BILITOT 0.3 0.7  PROT 6.6 5.7*  ALBUMIN 3.1* 2.8*   No results found for this basename: LIPASE, AMYLASE,  in the last 168 hours No results found for this basename: AMMONIA,  in the last 168 hours CBC:  Recent Labs Lab 06/23/13 0914  06/24/13 0637  WBC 5.8 7.2  NEUTROABS 4.2  --   HGB 7.1* 9.0*  HCT 22.5* 27.3*  MCV 90.7 88.1  PLT 321 256   Cardiac Enzymes: No results found for this basename: CKTOTAL, CKMB, CKMBINDEX, TROPONINI,  in the last 168 hours BNP: BNP (last 3 results) No results found for this basename: PROBNP,  in the last 8760 hours CBG: No results found for this basename: GLUCAP,  in the last 168 hours     Signed:  Gwenyth Bender  [facility] 06/24/2013, 11:27 AM

## 2013-06-24 NOTE — Op Note (Signed)
[facility] [address] [place], [address]   COLONOSCOPY PROCEDURE REPORT  PATIENT: Mistie, Adney  MR#: [id number] BIRTHDATE: 24-May-1925 , 88  yrs. old GENDER: Female ENDOSCOPIST: Jonette Eva, MD REFERRED [id number] Regino Schultze, M.D. PROCEDURE DATE:  06/24/2013 PROCEDURE:   Colonoscopy with snare polypectomy, Colonoscopy with cold biopsy polypectomy, and Submucosal injection: SPOT (1CC) INDICATIONS:Rectal Bleeding.  PRESENTED WITH A HB 7.1 AND UP TO 9.0 AFTER 2U pRBCs. MEDICATIONS: Demerol 50 mg IV and Versed 4 mg IV  DESCRIPTION OF PROCEDURE:    Physical exam was performed.  Informed consent was obtained from the patient after explaining the benefits, risks, and alternatives to procedure.  The patient was connected to monitor and placed in left lateral position. Continuous oxygen was provided by nasal cannula and IV medicine administered through an indwelling cannula.  After administration of sedation and rectal exam, the patients rectum was intubated and the EC-3890Li ([id number])  colonoscope was advanced under direct visualization to the ileum.  The scope was removed slowly by carefully examining the color, texture, anatomy, and integrity mucosa on the way out.  The patient was recovered in endoscopy and discharged home in satisfactory condition.     COLON FINDINGS: The mucosa appeared normal in the terminal ileum.  , Two sessile polyps measuring 3-11 mm in size were found in the rectosigmoid colon. 1 CC SPOT TATTOO TO BASE OF LARGE POLYP.  A polypectomy was performed with cold forceps and using snare cautery.  , There was severe diverticulosis noted throughout the entire examined colon with associated muscular hypertrophy and luminal narrowing.  Large internal hemorrhoids were found.    NO OLD BLOOD OR FRESH BLOOD SEEN IN THE ILEUM OR COLON.  PREP QUALITY: excellent.   CECAL W/D TIME: 12 minutes COMPLICATIONS: None  ENDOSCOPIC IMPRESSION: 1.   Normal  mucosa in the terminal ileum 2.   Two COLON POLYPS REMOVED 3.   RECTAL BLEEDING AND DROP IN HB DUE TO DIVERTICULOSIS/HEMORRHOIDS  RECOMMENDATIONS: AWAIT BIOPSY ADVANCE DIET PROTONIX DAILY COMPLETE 10 DAYS ANUSOL OPV IN 4 MOS NEXT FLEX SIG IN 1 YEAR IF ADVANCED AND 3 YEARS IF A SIMPLE ADENOMA       _______________________________ Rosalie DoctorJonette Eva, MD 06/24/2013 9:27 AM     PATIENT NAME:  Deaven, Barron MR#: [id number]

## 2013-06-25 ENCOUNTER — Encounter (HOSPITAL_COMMUNITY): Payer: Self-pay | Admitting: Gastroenterology

## 2013-07-06 ENCOUNTER — Telehealth: Payer: Self-pay | Admitting: Gastroenterology

## 2013-07-06 NOTE — Telephone Encounter (Signed)
Please call pt. She had a simple adenoma removed from herLEFT colon. FOLLOW A High fiber diet. OPV IN APR 2015 E30W/ [facility]  ANEMIA/GI BLEED. FLEX SIG IN 3 YEARS.

## 2013-07-06 NOTE — Telephone Encounter (Signed)
Called and informed pt.  

## 2013-07-08 ENCOUNTER — Ambulatory Visit: Payer: Medicare PPO | Admitting: Gastroenterology

## 2013-07-08 NOTE — Telephone Encounter (Signed)
Reminder in epic °

## 2013-07-16 ENCOUNTER — Emergency Department (HOSPITAL_COMMUNITY)
Admission: EM | Admit: 2013-07-16 | Discharge: 2013-07-16 | Disposition: A | Discharge status: Home / Self Care | Payer: Medicare PPO | Attending: Emergency Medicine | Admitting: Emergency Medicine

## 2013-07-16 ENCOUNTER — Encounter (HOSPITAL_COMMUNITY): Payer: Self-pay | Admitting: Emergency Medicine

## 2013-07-16 ENCOUNTER — Emergency Department (HOSPITAL_COMMUNITY): Payer: Medicare PPO

## 2013-07-16 DIAGNOSIS — Z7982 Long term (current) use of aspirin: Secondary | ICD-10-CM | POA: Insufficient documentation

## 2013-07-16 DIAGNOSIS — D649 Anemia, unspecified: Secondary | ICD-10-CM | POA: Insufficient documentation

## 2013-07-16 DIAGNOSIS — J209 Acute bronchitis, unspecified: Secondary | ICD-10-CM | POA: Insufficient documentation

## 2013-07-16 DIAGNOSIS — E871 Hypo-osmolality and hyponatremia: Secondary | ICD-10-CM | POA: Insufficient documentation

## 2013-07-16 DIAGNOSIS — Z9089 Acquired absence of other organs: Secondary | ICD-10-CM | POA: Insufficient documentation

## 2013-07-16 DIAGNOSIS — Z79899 Other long term (current) drug therapy: Secondary | ICD-10-CM | POA: Insufficient documentation

## 2013-07-16 DIAGNOSIS — E876 Hypokalemia: Secondary | ICD-10-CM

## 2013-07-16 DIAGNOSIS — I10 Essential (primary) hypertension: Secondary | ICD-10-CM | POA: Insufficient documentation

## 2013-07-16 DIAGNOSIS — J40 Bronchitis, not specified as acute or chronic: Secondary | ICD-10-CM

## 2013-07-16 LAB — CBC WITH DIFFERENTIAL/PLATELET
HCT: 31 % — ABNORMAL LOW (ref 36.0–46.0)
Hemoglobin: 10 g/dL — ABNORMAL LOW (ref 12.0–15.0)
Lymphocytes Relative: 16 % (ref 12–46)
Lymphs Abs: 0.7 10*3/uL (ref 0.7–4.0)
MCH: 28.9 pg (ref 26.0–34.0)
Monocytes Absolute: 0.3 10*3/uL (ref 0.1–1.0)
Monocytes Relative: 8 % (ref 3–12)
Neutro Abs: 3 10*3/uL (ref 1.7–7.7)
Neutrophils Relative %: 72 % (ref 43–77)
RBC: 3.46 MIL/uL — ABNORMAL LOW (ref 3.87–5.11)

## 2013-07-16 LAB — BASIC METABOLIC PANEL
BUN: 6 mg/dL (ref 6–23)
CO2: 28 mEq/L (ref 19–32)
Chloride: 98 mEq/L (ref 96–112)
Creatinine, Ser: 0.91 mg/dL (ref 0.50–1.10)
GFR calc non Af Amer: 56 mL/min — ABNORMAL LOW (ref >90)
Glucose, Bld: 118 mg/dL — ABNORMAL HIGH (ref 70–99)
Potassium: 2.7 mEq/L — CL (ref 3.5–5.1)

## 2013-07-16 MED ORDER — AZITHROMYCIN 250 MG PO TABS: ORAL_TABLET | ORAL | Status: DC

## 2013-07-16 MED ORDER — ALBUTEROL SULFATE HFA 108 (90 BASE) MCG/ACT IN AERS
2.0000 | INHALATION_SPRAY | Freq: Once | RESPIRATORY_TRACT | Status: AC
  Administered 2013-07-16: 2 via RESPIRATORY_TRACT
  Filled 2013-07-16: qty 6.7

## 2013-07-16 MED ORDER — POTASSIUM CHLORIDE CRYS ER 20 MEQ PO TBCR: 20.0000 meq | EXTENDED_RELEASE_TABLET | Freq: Every day | ORAL | Status: DC

## 2013-07-16 MED ORDER — POTASSIUM CHLORIDE CRYS ER 20 MEQ PO TBCR
40.0000 meq | EXTENDED_RELEASE_TABLET | Freq: Once | ORAL | Status: AC
  Administered 2013-07-16: 40 meq via ORAL
  Filled 2013-07-16: qty 2

## 2013-07-16 NOTE — ED Notes (Signed)
Dry, non productive cough

## 2013-07-16 NOTE — ED Notes (Signed)
.  CRITICAL VALUE ALERT  Critical value received:  potassium  Date of notification:  07/16/13   Time of notification:  0957am  Critical value read back:yes  Nurse who received alert:  Aqueelah Cotrell  MD notified (1st page):  Dr Criss Alvine  Time of first page:  0957  Time MD responded:  [phone]

## 2013-07-16 NOTE — ED Notes (Signed)
Patient reports cough x 2 days. Denies shortness of breath, chest pain. Alert/oriented x 4. No distress.

## 2013-07-16 NOTE — ED Notes (Signed)
Patient with no complaints at this time. Respirations even and unlabored. Skin warm/dry. Discharge instructions reviewed with patient at this time. Patient given opportunity to voice concerns/ask questions. Patient discharged at this time and left Emergency Department with steady gait.   

## 2013-07-16 NOTE — ED Provider Notes (Signed)
CSN: [id number]     Arrival date & time 07/16/13  3086 History   First MD Initiated Contact with Patient 07/16/13 [phone]     Chief Complaint  Patient presents with  . Cough   (Consider location/radiation/quality/duration/timing/severity/associated sxs/prior Treatment) HPI Comments: 79 year old female with a cough for the past 2 days. She states his become mildly productive with clear sputum. Denies any fevers, chills, shortness of breath, or chest pain. She states she has been having a running nose since yesterday as well. She feels like a lot of people at church for sick with colds recently. She states there is no trouble walking. She has had some wheezing this morning. She's not have any known lung disease does never used an inhaler before. She denies any nasal congestion, headache, sore throat, or myalgias.   Past Medical History  Diagnosis Date  . Hypertension   . Anemia    Past Surgical History  Procedure Laterality Date  . Cholecystectomy    . Colonoscopy      remote past  . Cataract extraction w/phaco  05/29/2012    Procedure: CATARACT EXTRACTION PHACO AND INTRAOCULAR LENS PLACEMENT (IOC);  Surgeon: Gemma Payor, MD;  Location: [facility];  Service: Ophthalmology;  Laterality: Right;  CDE:18.72  . Colonoscopy N/A 06/24/2013    Procedure: COLONOSCOPY;  Surgeon: West Bali, MD;  Location: [facility];  Service: Endoscopy;  Laterality: N/A;   Family History  Problem Relation Age of Onset  . Heart disease Father   . Colon cancer Sister     age 27   History  Substance Use Topics  . Smoking status: Never Smoker   . Smokeless tobacco: Not on file  . Alcohol Use: No   OB History   Grav Para Term Preterm Abortions TAB SAB Ect Mult Living                 Review of Systems  Constitutional: Negative for fever and chills.  HENT: Positive for rhinorrhea. Negative for congestion and sore throat.   Respiratory: Positive for cough and wheezing. Negative for shortness of breath.    Cardiovascular: Negative for chest pain.  Gastrointestinal: Negative for vomiting.  All other systems reviewed and are negative.    Allergies  Review of patient's allergies indicates no known allergies.  Home Medications   Current Outpatient Rx  Name  Route  Sig  Dispense  Refill  . acetaminophen (TYLENOL) 325 MG tablet   Oral   Take 650 mg by mouth every 6 (six) hours as needed. For pain         . aspirin EC 81 MG tablet   Oral   Take 81 mg by mouth daily.         . ferrous sulfate 325 (65 FE) MG tablet   Oral   Take 325 mg by mouth daily.         Marland Kitchen lisinopril (PRINIVIL,ZESTRIL) 10 MG tablet   Oral   Take 10 mg by mouth daily.         . pantoprazole (PROTONIX) 40 MG tablet   Oral   Take 1 tablet (40 mg total) by mouth daily before breakfast.   30 tablet   1    BP 142/86  Pulse 67  Temp(Src) 98.5 F (36.9 C) (Oral)  Resp 19  Ht 5\' 4"  (1.626 m)  Wt 160 lb (72.576 kg)  BMI 27.45 kg/m2  SpO2 95% Physical Exam  Nursing note and vitals reviewed. Constitutional: She is oriented  to person, place, and time. She appears well-developed and well-nourished.  HENT:  Head: Normocephalic and atraumatic.  Right Ear: External ear normal.  Left Ear: External ear normal.  Nose: Nose normal.  Eyes: Right eye exhibits no discharge. Left eye exhibits no discharge.  Cardiovascular: Normal rate, regular rhythm and normal heart sounds.   Pulmonary/Chest: Effort normal. Not tachypneic. She has no decreased breath sounds. She has wheezes in the right lower field and the left lower field.  Abdominal: Soft. There is no tenderness.  Neurological: She is alert and oriented to person, place, and time.  Skin: Skin is warm and dry.    ED Course  Procedures (including critical care time) Labs Review Labs Reviewed  CBC WITH DIFFERENTIAL - Abnormal; Notable for the following:    RBC 3.46 (*)    Hemoglobin 10.0 (*)    HCT 31.0 (*)    All other components within normal limits   BASIC METABOLIC PANEL - Abnormal; Notable for the following:    Potassium 2.7 (*)    Glucose, Bld 118 (*)    GFR calc non Af Amer 56 (*)    GFR calc Af Amer 64 (*)    All other components within normal limits   Imaging Review Dg Chest 2 View  07/16/2013   CLINICAL DATA:  Cough, congestion, shortness of breath  EXAM: CHEST  2 VIEW  COMPARISON:  11/29/2002 prior exam report.  FINDINGS: Mild prominence of the bronchovascular markings at the lung bases could indicate atelectasis given the overall low lung volumes. Hyperinflation suggests COPD. No pleural effusion. No focal opacity on the lateral view. Heart size is mildly enlarged.  IMPRESSION: Mild hyperinflation suggesting COPD without focal superimposed abnormality.   Electronically Signed   By: Christiana Pellant M.D.   On: 07/16/2013 10:07    EKG Interpretation   None       MDM   1. Bronchitis   2. Hypokalemia    Patient appears well here, has no hypoxia, tachycardia, or respiratory distress. She has no symptoms of shortness of breath. Her x-ray shows no obvious pneumonia. She does have some mild wheezing, we'll give albuterol inhaler here for use at home. She was given oral potassium for her hypokalemia. On review she's had hypokalemia multiple times. We'll give small dose of supplement potassium and encourage changes in diet. We'll cover what is likely bronchitis with some azithromycin I discussed strict return precautions. Patient feels comfortable going home and understands return precautions.    Audree Camel, MD 07/16/13 [phone]

## 2013-08-06 ENCOUNTER — Encounter: Payer: Self-pay | Admitting: Gastroenterology

## 2013-08-06 ENCOUNTER — Ambulatory Visit (INDEPENDENT_AMBULATORY_CARE_PROVIDER_SITE_OTHER): Payer: Medicare PPO | Admitting: Gastroenterology

## 2013-08-06 ENCOUNTER — Encounter (INDEPENDENT_AMBULATORY_CARE_PROVIDER_SITE_OTHER): Payer: Self-pay

## 2013-08-06 VITALS — BP 133/75 | HR 48 | Temp 97.9°F | Wt 151.8 lb

## 2013-08-06 DIAGNOSIS — D649 Anemia, unspecified: Secondary | ICD-10-CM

## 2013-08-06 DIAGNOSIS — K922 Gastrointestinal hemorrhage, unspecified: Secondary | ICD-10-CM

## 2013-08-06 NOTE — Assessment & Plan Note (Signed)
Recent lower GI bleed likely related to diverticular bleed and/or hemorrhoids. Received 2 units of blood. Hemoglobin 10 on Christmas Day. She has had no further obvious bleeding. Currently on daily iron and monthly B12. History of chronic anemia as previously noted. At this point no further workup. As per Dr. Oneida Alar are original plan, we will have her come back in April for reevaluation at that time. If she were to have any recurrent bleeding she should let us know as soon as possible. If she has recurrent bleeding felt to be related to hemorrhoids she may be a candidate for hemorrhoid banding in our office.

## 2013-08-06 NOTE — Progress Notes (Signed)
Primary Care Physician:  Leonides Grills, MD  Primary Gastroenterologist:  Barney Drain, MD   Chief Complaint  Patient presents with  . Rectal Bleeding    HPI:  Lynn Weaver is a 80 y.o. female here for hospital f/u. She was seen back in 06/23/2013. Hospitalized when she presented to the emergency department secondary to acute onset of rectal bleeding. Her hemoglobin was 7.1. Baseline. In the 8-9 range. She had several episodes of large volume painless rectal bleeding with passage of some clots as well. She received 2 units of packed red blood cells. At time of discharge her Hgb was 9. Colonoscopy revealed severe diverticulosis, significant hemorrhoids, 2 rectosigmoid tubular adenomas. Normal TI. No fresh or old blood seen. Suspected to have bleeding from diverticula and/or hemorrhoids. Patient had her hemoglobin checked again on Christmas Day when she presented to the emergency department with upper respiratory complaints. Her hemoglobin was up to 10 at that time  She presents with her niece today. Patient states she's had no further bleeding. Her bowel movements have been regular. Denies any abdominal pain, rectal pain, vomiting. No heartburn or dysphagia. She feels well. She is taking iron supplement once daily and B12 injections monthly.  Current Outpatient Prescriptions  Medication Sig Dispense Refill  . acetaminophen (TYLENOL) 325 MG tablet Take 325-650 mg by mouth daily. For pain      . aspirin EC 81 MG tablet Take 81 mg by mouth daily.      . Cyanocobalamin (VITAMIN B-12 IJ) Inject as directed. Monthly      . ferrous sulfate 325 (65 FE) MG tablet Take 325 mg by mouth daily with breakfast.      . lisinopril (PRINIVIL,ZESTRIL) 10 MG tablet Take 10 mg by mouth daily.      . pantoprazole (PROTONIX) 40 MG tablet Take 1 tablet (40 mg total) by mouth daily before breakfast.  30 tablet  1   No current facility-administered medications for this visit.    Allergies as of 08/06/2013  . (No  Known Allergies)    Past Medical History  Diagnosis Date  . Hypertension   . Anemia     Past Surgical History  Procedure Laterality Date  . Cholecystectomy    . Cataract extraction w/phaco  05/29/2012    Procedure: CATARACT EXTRACTION PHACO AND INTRAOCULAR LENS PLACEMENT (IOC);  Surgeon: Tonny Branch, MD;  Location: [facility];  Service: Ophthalmology;  Laterality: Right;  CDE:18.72  . Colonoscopy N/A 06/24/2013    Significant diverticular disease. Large hemorrhoids. Terminal ileum normal. 2 rectosigmoid polyps (TA) removed. No old or fresh blood noted during exam. Likely had bleeding from diverticula and hemorrhoids. consider flex sig in 3 years.    Family History  Problem Relation Age of Onset  . Heart disease Father   . Colon cancer Sister     age 33    History   Social History  . Marital Status: Widowed    Spouse Name: N/A    Number of Children: N/A  . Years of Education: N/A   Occupational History  . Not on file.   Social History Main Topics  . Smoking status: Never Smoker   . Smokeless tobacco: Not on file  . Alcohol Use: No  . Drug Use: No  . Sexual Activity: Not on file   Other Topics Concern  . Not on file   Social History Narrative  . No narrative on file      ROS:  General: Negative for anorexia, weight loss, fever,  chills, fatigue, weakness. Eyes: Negative for vision changes.  ENT: Negative for hoarseness, difficulty swallowing , nasal congestion. CV: Negative for chest pain, angina, palpitations, dyspnea on exertion, peripheral edema.  Respiratory: Negative for dyspnea at rest, dyspnea on exertion, cough, sputum, wheezing.  GI: See history of present illness. GU:  Negative for dysuria, hematuria, urinary incontinence, urinary frequency, nocturnal urination.  MS: Negative for joint pain, low back pain.  Derm: Negative for rash or itching.  Neuro: Negative for weakness, abnormal sensation, seizure, frequent headaches, memory loss, confusion.  Psych:  Negative for anxiety, depression, suicidal ideation, hallucinations.  Endo: Negative for unusual weight change.  Heme: Negative for bruising or bleeding. Allergy: Negative for rash or hives.    Physical Examination:  BP 133/75  Pulse 48  Temp(Src) 97.9 F (36.6 C) (Oral)  Wt 151 lb 12.8 oz (68.856 kg)   General: Well-nourished, well-developed in no acute distress.  Head: Normocephalic, atraumatic.   Eyes: Conjunctiva pink, no icterus. Mouth: Oropharyngeal mucosa moist and pink , no lesions erythema or exudate. Neck: Supple without thyromegaly, masses, or lymphadenopathy.  Lungs: Clear to auscultation bilaterally.  Heart: Regular rate and rhythm, no murmurs rubs or gallops.  Abdomen: Bowel sounds are normal, nontender, nondistended, no hepatosplenomegaly or masses, no abdominal bruits or    hernia , no rebound or guarding.   Rectal: Not performed Extremities: No lower extremity edema. No clubbing or deformities.  Neuro: Alert and oriented x 4 , grossly normal neurologically.  Skin: Warm and dry, no rash or jaundice.   Psych: Alert and cooperative, normal mood and affect.  Labs: Lab Results  Component Value Date   WBC 4.1 07/16/2013   HGB 10.0* 07/16/2013   HCT 31.0* 07/16/2013   MCV 89.6 07/16/2013   PLT 279 07/16/2013     Imaging Studies: Dg Chest 2 View  07/16/2013   CLINICAL DATA:  Cough, congestion, shortness of breath  EXAM: CHEST  2 VIEW  COMPARISON:  11/29/2002 prior exam report.  FINDINGS: Mild prominence of the bronchovascular markings at the lung bases could indicate atelectasis given the overall low lung volumes. Hyperinflation suggests COPD. No pleural effusion. No focal opacity on the lateral view. Heart size is mildly enlarged.  IMPRESSION: Mild hyperinflation suggesting COPD without focal superimposed abnormality.   Electronically Signed   By: Conchita Paris M.D.   On: 07/16/2013 10:07

## 2013-08-06 NOTE — Progress Notes (Signed)
cc'd to pcp 

## 2013-08-06 NOTE — Patient Instructions (Signed)
1. Please continue high fiber diet. 2. Call if you have recurrent bleeding. 3. Follow up office visit in 10/2013 with Dr. Oneida Alar.

## 2013-11-17 ENCOUNTER — Inpatient Hospital Stay (HOSPITAL_COMMUNITY)
Admission: EM | Admit: 2013-11-17 | Discharge: 2013-11-24 | DRG: 280 | Disposition: A | Discharge status: Home / Self Care | Payer: Medicare PPO | Attending: Cardiovascular Disease | Admitting: Cardiovascular Disease

## 2013-11-17 ENCOUNTER — Encounter (HOSPITAL_COMMUNITY): Payer: Self-pay | Admitting: Emergency Medicine

## 2013-11-17 ENCOUNTER — Emergency Department (HOSPITAL_COMMUNITY): Payer: Medicare PPO

## 2013-11-17 DIAGNOSIS — I059 Rheumatic mitral valve disease, unspecified: Secondary | ICD-10-CM | POA: Diagnosis present

## 2013-11-17 DIAGNOSIS — F0390 Unspecified dementia without behavioral disturbance: Secondary | ICD-10-CM | POA: Diagnosis present

## 2013-11-17 DIAGNOSIS — N179 Acute kidney failure, unspecified: Secondary | ICD-10-CM | POA: Diagnosis present

## 2013-11-17 DIAGNOSIS — Z23 Encounter for immunization: Secondary | ICD-10-CM

## 2013-11-17 DIAGNOSIS — I10 Essential (primary) hypertension: Secondary | ICD-10-CM | POA: Diagnosis present

## 2013-11-17 DIAGNOSIS — I2789 Other specified pulmonary heart diseases: Secondary | ICD-10-CM | POA: Diagnosis present

## 2013-11-17 DIAGNOSIS — I428 Other cardiomyopathies: Secondary | ICD-10-CM | POA: Diagnosis present

## 2013-11-17 DIAGNOSIS — I509 Heart failure, unspecified: Secondary | ICD-10-CM | POA: Diagnosis present

## 2013-11-17 DIAGNOSIS — I5021 Acute systolic (congestive) heart failure: Secondary | ICD-10-CM | POA: Diagnosis present

## 2013-11-17 DIAGNOSIS — I079 Rheumatic tricuspid valve disease, unspecified: Secondary | ICD-10-CM | POA: Diagnosis present

## 2013-11-17 DIAGNOSIS — R197 Diarrhea, unspecified: Secondary | ICD-10-CM | POA: Diagnosis present

## 2013-11-17 DIAGNOSIS — I214 Non-ST elevation (NSTEMI) myocardial infarction: Principal | ICD-10-CM | POA: Diagnosis present

## 2013-11-17 DIAGNOSIS — E876 Hypokalemia: Secondary | ICD-10-CM | POA: Diagnosis present

## 2013-11-17 DIAGNOSIS — D638 Anemia in other chronic diseases classified elsewhere: Secondary | ICD-10-CM | POA: Diagnosis present

## 2013-11-17 LAB — COMPREHENSIVE METABOLIC PANEL
ALT: 16 U/L (ref 0–35)
AST: 27 U/L (ref 0–37)
Albumin: 3.4 g/dL — ABNORMAL LOW (ref 3.5–5.2)
Alkaline Phosphatase: 54 U/L (ref 39–117)
BUN: 10 mg/dL (ref 6–23)
CO2: 25 mEq/L (ref 19–32)
CREATININE: 1 mg/dL (ref 0.50–1.10)
Calcium: 9.1 mg/dL (ref 8.4–10.5)
Chloride: 99 mEq/L (ref 96–112)
GFR calc non Af Amer: 49 mL/min — ABNORMAL LOW (ref >90)
GFR, EST AFRICAN AMERICAN: 57 mL/min — AB (ref >90)
GLUCOSE: 164 mg/dL — AB (ref 70–99)
Potassium: 2.4 mEq/L — CL (ref 3.7–5.3)
SODIUM: 141 meq/L (ref 137–147)
TOTAL PROTEIN: 6.7 g/dL (ref 6.0–8.3)
Total Bilirubin: 0.8 mg/dL (ref 0.3–1.2)

## 2013-11-17 LAB — CBC WITH DIFFERENTIAL/PLATELET
Basophils Absolute: 0 10*3/uL (ref 0.0–0.1)
Basophils Relative: 0 % (ref 0–1)
Eosinophils Absolute: 0 10*3/uL (ref 0.0–0.7)
Eosinophils Relative: 1 % (ref 0–5)
HCT: 33.4 % — ABNORMAL LOW (ref 36.0–46.0)
Hemoglobin: 10.6 g/dL — ABNORMAL LOW (ref 12.0–15.0)
LYMPHS ABS: 0.7 10*3/uL (ref 0.7–4.0)
Lymphocytes Relative: 16 % (ref 12–46)
MCH: 29.8 pg (ref 26.0–34.0)
MCHC: 31.7 g/dL (ref 30.0–36.0)
MCV: 93.8 fL (ref 78.0–100.0)
MONOS PCT: 6 % (ref 3–12)
Monocytes Absolute: 0.2 10*3/uL (ref 0.1–1.0)
NEUTROS PCT: 77 % (ref 43–77)
Neutro Abs: 3.2 10*3/uL (ref 1.7–7.7)
PLATELETS: 249 10*3/uL (ref 150–400)
RBC: 3.56 MIL/uL — AB (ref 3.87–5.11)
RDW: 17.3 % — ABNORMAL HIGH (ref 11.5–15.5)
WBC: 4.1 10*3/uL (ref 4.0–10.5)

## 2013-11-17 LAB — URINALYSIS, ROUTINE W REFLEX MICROSCOPIC
Bilirubin Urine: NEGATIVE
GLUCOSE, UA: NEGATIVE mg/dL
Ketones, ur: NEGATIVE mg/dL
LEUKOCYTES UA: NEGATIVE
Nitrite: NEGATIVE
Protein, ur: 30 mg/dL — AB
SPECIFIC GRAVITY, URINE: 1.01 (ref 1.005–1.030)
Urobilinogen, UA: 0.2 mg/dL (ref 0.0–1.0)
pH: 6 (ref 5.0–8.0)

## 2013-11-17 LAB — PRO B NATRIURETIC PEPTIDE: Pro B Natriuretic peptide (BNP): 27715 pg/mL — ABNORMAL HIGH (ref 0–450)

## 2013-11-17 LAB — BASIC METABOLIC PANEL
BUN: 10 mg/dL (ref 6–23)
CHLORIDE: 103 meq/L (ref 96–112)
CO2: 19 mEq/L (ref 19–32)
Calcium: 8.5 mg/dL (ref 8.4–10.5)
Creatinine, Ser: 0.93 mg/dL (ref 0.50–1.10)
GFR calc Af Amer: 62 mL/min — ABNORMAL LOW (ref >90)
GFR calc non Af Amer: 54 mL/min — ABNORMAL LOW (ref >90)
Glucose, Bld: 124 mg/dL — ABNORMAL HIGH (ref 70–99)
POTASSIUM: 3.5 meq/L — AB (ref 3.7–5.3)
Sodium: 141 mEq/L (ref 137–147)

## 2013-11-17 LAB — TROPONIN I
TROPONIN I: 0.56 ng/mL — AB (ref <0.30)
Troponin I: 0.37 ng/mL (ref <0.30)
Troponin I: 0.45 ng/mL (ref <0.30)
Troponin I: 0.52 ng/mL (ref <0.30)

## 2013-11-17 LAB — URINE MICROSCOPIC-ADD ON

## 2013-11-17 LAB — MRSA PCR SCREENING: MRSA by PCR: NEGATIVE

## 2013-11-17 LAB — TSH: TSH: 3 u[IU]/mL (ref 0.350–4.500)

## 2013-11-17 LAB — LIPASE, BLOOD: LIPASE: 33 U/L (ref 11–59)

## 2013-11-17 MED ORDER — ONDANSETRON HCL 4 MG/2ML IJ SOLN
4.0000 mg | Freq: Four times a day (QID) | INTRAMUSCULAR | Status: DC | PRN
  Administered 2013-11-17 – 2013-11-18 (×3): 4 mg via INTRAVENOUS
  Filled 2013-11-17 (×3): qty 2

## 2013-11-17 MED ORDER — ACETAMINOPHEN 325 MG PO TABS: 325.0000 mg | ORAL_TABLET | Freq: Two times a day (BID) | ORAL | Status: DC | PRN

## 2013-11-17 MED ORDER — FUROSEMIDE 10 MG/ML IJ SOLN
40.0000 mg | Freq: Once | INTRAMUSCULAR | Status: AC
  Administered 2013-11-17: 40 mg via INTRAVENOUS
  Filled 2013-11-17: qty 4

## 2013-11-17 MED ORDER — PANTOPRAZOLE SODIUM 40 MG PO TBEC
40.0000 mg | DELAYED_RELEASE_TABLET | Freq: Every day | ORAL | Status: DC
  Administered 2013-11-18 – 2013-11-24 (×7): 40 mg via ORAL
  Filled 2013-11-17 (×6): qty 1

## 2013-11-17 MED ORDER — LISINOPRIL 2.5 MG PO TABS: 2.5000 mg | ORAL_TABLET | Freq: Every day | ORAL | Status: DC

## 2013-11-17 MED ORDER — POTASSIUM CHLORIDE CRYS ER 20 MEQ PO TBCR
40.0000 meq | EXTENDED_RELEASE_TABLET | Freq: Three times a day (TID) | ORAL | Status: DC
  Administered 2013-11-17 – 2013-11-18 (×3): 40 meq via ORAL
  Filled 2013-11-17 (×5): qty 2

## 2013-11-17 MED ORDER — METOPROLOL TARTRATE 12.5 MG HALF TABLET
12.5000 mg | ORAL_TABLET | Freq: Two times a day (BID) | ORAL | Status: DC
  Administered 2013-11-17 – 2013-11-24 (×14): 12.5 mg via ORAL
  Filled 2013-11-17 (×15): qty 1

## 2013-11-17 MED ORDER — ONDANSETRON HCL 4 MG/2ML IJ SOLN
4.0000 mg | Freq: Once | INTRAMUSCULAR | Status: AC
Start: 2013-11-17 — End: 2013-11-17
  Administered 2013-11-17: 4 mg via INTRAVENOUS
  Filled 2013-11-17: qty 2

## 2013-11-17 MED ORDER — HEPARIN SODIUM (PORCINE) 5000 UNIT/ML IJ SOLN
5000.0000 [IU] | Freq: Three times a day (TID) | INTRAMUSCULAR | Status: DC
  Administered 2013-11-17 – 2013-11-24 (×19): 5000 [IU] via SUBCUTANEOUS
  Filled 2013-11-17 (×24): qty 1

## 2013-11-17 MED ORDER — FERROUS SULFATE 325 (65 FE) MG PO TABS
325.0000 mg | ORAL_TABLET | Freq: Every day | ORAL | Status: DC
  Administered 2013-11-18 – 2013-11-24 (×7): 325 mg via ORAL
  Filled 2013-11-17 (×8): qty 1

## 2013-11-17 MED ORDER — SODIUM CHLORIDE 0.9 % IV SOLN
INTRAVENOUS | Status: DC
  Administered 2013-11-17: 11:00:00 via INTRAVENOUS

## 2013-11-17 MED ORDER — DILTIAZEM HCL 30 MG PO TABS
30.0000 mg | ORAL_TABLET | Freq: Four times a day (QID) | ORAL | Status: DC
  Administered 2013-11-17 – 2013-11-18 (×3): 30 mg via ORAL
  Filled 2013-11-17 (×7): qty 1

## 2013-11-17 MED ORDER — SODIUM CHLORIDE 0.9 % IJ SOLN: 3.0000 mL | INTRAMUSCULAR | Status: DC | PRN

## 2013-11-17 MED ORDER — LOPERAMIDE HCL 2 MG PO CAPS
2.0000 mg | ORAL_CAPSULE | ORAL | Status: DC | PRN
  Administered 2013-11-18: 2 mg via ORAL
  Filled 2013-11-17 (×2): qty 1

## 2013-11-17 MED ORDER — HEPARIN (PORCINE) IN NACL 100-0.45 UNIT/ML-% IJ SOLN
800.0000 [IU]/h | INTRAMUSCULAR | Status: DC
  Administered 2013-11-17: 800 [IU]/h via INTRAVENOUS

## 2013-11-17 MED ORDER — SODIUM CHLORIDE 0.9 % IV SOLN
INTRAVENOUS | Status: AC
  Administered 2013-11-17: 16:00:00 via INTRAVENOUS

## 2013-11-17 MED ORDER — IOHEXOL 300 MG/ML  SOLN
100.0000 mL | Freq: Once | INTRAMUSCULAR | Status: AC | PRN
  Administered 2013-11-17: 100 mL via INTRAVENOUS

## 2013-11-17 MED ORDER — POTASSIUM CHLORIDE CRYS ER 20 MEQ PO TBCR
40.0000 meq | EXTENDED_RELEASE_TABLET | Freq: Once | ORAL | Status: AC
  Administered 2013-11-17: 40 meq via ORAL
  Filled 2013-11-17: qty 2

## 2013-11-17 MED ORDER — ACETAMINOPHEN 325 MG PO TABS: 325.0000 mg | ORAL_TABLET | Freq: Every day | ORAL | Status: DC

## 2013-11-17 MED ORDER — HEPARIN (PORCINE) IN NACL 100-0.45 UNIT/ML-% IJ SOLN
14.0000 [IU]/kg/h | Freq: Once | INTRAMUSCULAR | Status: DC
  Administered 2013-11-17: 14 [IU]/kg/h via INTRAVENOUS
  Filled 2013-11-17: qty 250

## 2013-11-17 MED ORDER — ASPIRIN EC 81 MG PO TBEC
81.0000 mg | DELAYED_RELEASE_TABLET | Freq: Every day | ORAL | Status: DC
  Administered 2013-11-18 – 2013-11-22 (×5): 81 mg via ORAL
  Filled 2013-11-17 (×6): qty 1

## 2013-11-17 MED ORDER — ACETAMINOPHEN 325 MG PO TABS
650.0000 mg | ORAL_TABLET | ORAL | Status: DC | PRN
  Administered 2013-11-18: 650 mg via ORAL
  Filled 2013-11-17: qty 2

## 2013-11-17 MED ORDER — SODIUM CHLORIDE 0.9 % IV BOLUS (SEPSIS)
250.0000 mL | Freq: Once | INTRAVENOUS | Status: AC
  Administered 2013-11-17: 250 mL via INTRAVENOUS

## 2013-11-17 MED ORDER — POTASSIUM CHLORIDE 10 MEQ/100ML IV SOLN
INTRAVENOUS | Status: AC
  Filled 2013-11-17: qty 100

## 2013-11-17 MED ORDER — HEPARIN SODIUM (PORCINE) 5000 UNIT/ML IJ SOLN
4000.0000 [IU] | Freq: Once | INTRAMUSCULAR | Status: AC
  Administered 2013-11-17: 4000 [IU] via INTRAVENOUS
  Filled 2013-11-17: qty 1

## 2013-11-17 MED ORDER — PNEUMOCOCCAL VAC POLYVALENT 25 MCG/0.5ML IJ INJ
0.5000 mL | INJECTION | INTRAMUSCULAR | Status: DC
  Filled 2013-11-17: qty 0.5

## 2013-11-17 MED ORDER — SODIUM CHLORIDE 0.9 % IJ SOLN
3.0000 mL | Freq: Two times a day (BID) | INTRAMUSCULAR | Status: DC
  Administered 2013-11-17 – 2013-11-21 (×5): 3 mL via INTRAVENOUS
  Administered 2013-11-21: 10:00:00 via INTRAVENOUS
  Administered 2013-11-22 – 2013-11-24 (×5): 3 mL via INTRAVENOUS

## 2013-11-17 MED ORDER — POTASSIUM CHLORIDE 10 MEQ/100ML IV SOLN
10.0000 meq | Freq: Once | INTRAVENOUS | Status: AC
  Administered 2013-11-17: 10 meq via INTRAVENOUS

## 2013-11-17 MED ORDER — ASPIRIN 81 MG PO CHEW
324.0000 mg | CHEWABLE_TABLET | Freq: Once | ORAL | Status: AC
  Administered 2013-11-17: 324 mg via ORAL
  Filled 2013-11-17: qty 4

## 2013-11-17 MED ORDER — LOPERAMIDE HCL 2 MG PO CAPS
4.0000 mg | ORAL_CAPSULE | Freq: Once | ORAL | Status: AC
  Administered 2013-11-17: 4 mg via ORAL
  Filled 2013-11-17: qty 2

## 2013-11-17 MED ORDER — SODIUM CHLORIDE 0.9 % IV SOLN
250.0000 mL | INTRAVENOUS | Status: DC | PRN
  Administered 2013-11-18: 22:00:00 via INTRAVENOUS

## 2013-11-17 MED ORDER — LISINOPRIL 10 MG PO TABS
10.0000 mg | ORAL_TABLET | Freq: Every day | ORAL | Status: DC
  Administered 2013-11-17 – 2013-11-18 (×2): 10 mg via ORAL
  Filled 2013-11-17 (×2): qty 1

## 2013-11-17 MED ORDER — LOPERAMIDE HCL 2 MG PO CAPS: 4.0000 mg | ORAL_CAPSULE | ORAL | Status: DC | PRN

## 2013-11-17 MED ORDER — POTASSIUM CHLORIDE 10 MEQ/100ML IV SOLN
10.0000 meq | Freq: Once | INTRAVENOUS | Status: AC
  Administered 2013-11-17: 10 meq via INTRAVENOUS
  Filled 2013-11-17: qty 100

## 2013-11-17 NOTE — ED Notes (Signed)
Pt attempted to obtain urine, but unable.

## 2013-11-17 NOTE — ED Provider Notes (Signed)
CSN: [id number]     Arrival date & time 11/17/13  5009 History  This chart was scribed for Lynn Kung, MD by Ludger Nutting, ED Scribe. This patient was seen in room [facility]10/[facility]10 and the patient's care was started 9:28 AM.    Chief Complaint  Patient presents with  . Diarrhea      The history is provided by the patient. No language interpreter was used.    HPI Comments: Lynn Weaver is a 80 y.o. female who presents to the Emergency Department complaining of intermittent episodes of diarrhea that began yesterday. She reports 2 episodes yesterday and 1 episode today and describes it as loose stools. She has associated nausea, decreased appetite. She has a history of polyps. She denies hematochezia, fever, vomiting, dysuria. She has a history of cholecystectomy.   PCP [facility]  Past Medical History  Diagnosis Date  . Hypertension   . Anemia    Past Surgical History  Procedure Laterality Date  . Cholecystectomy    . Cataract extraction w/phaco  05/29/2012    Procedure: CATARACT EXTRACTION PHACO AND INTRAOCULAR LENS PLACEMENT (IOC);  Surgeon: Tonny Branch, MD;  Location: [facility];  Service: Ophthalmology;  Laterality: Right;  CDE:18.72  . Colonoscopy N/A 06/24/2013    Significant diverticular disease. Large hemorrhoids. Terminal ileum normal. 2 rectosigmoid polyps (TA) removed. No old or fresh blood noted during exam. Likely had bleeding from diverticula and hemorrhoids. consider flex sig in 3 years.   Family History  Problem Relation Age of Onset  . Heart disease Father   . Colon cancer Sister     age 15   History  Substance Use Topics  . Smoking status: Never Smoker   . Smokeless tobacco: Not on file  . Alcohol Use: No   OB History   Grav Para Term Preterm Abortions TAB SAB Ect Mult Living                 Review of Systems  Constitutional: Positive for appetite change (decreased). Negative for fever and chills.  HENT: Negative for rhinorrhea and sore  throat.   Eyes: Negative for visual disturbance.  Respiratory: Negative for cough and shortness of breath.   Cardiovascular: Positive for leg swelling. Negative for chest pain.  Gastrointestinal: Positive for nausea and diarrhea. Negative for vomiting, abdominal pain, blood in stool and anal bleeding.  Genitourinary: Negative for dysuria.  Musculoskeletal: Negative for back pain and neck pain.  Skin: Negative for rash.  Neurological: Negative for headaches.  Hematological: Does not bruise/bleed easily.  Psychiatric/Behavioral: Negative for confusion.      Allergies  Review of patient's allergies indicates no known allergies.  Home Medications   Prior to Admission medications   Medication Sig Start Date End Date Taking? Authorizing Provider  acetaminophen (TYLENOL) 325 MG tablet Take 325-650 mg by mouth daily. For pain    Historical Provider, MD  ferrous sulfate 325 (65 FE) MG tablet Take 325 mg by mouth daily with breakfast.    Historical Provider, MD  lisinopril (PRINIVIL,ZESTRIL) 10 MG tablet Take 10 mg by mouth daily.    Historical Provider, MD  pantoprazole (PROTONIX) 40 MG tablet Take 1 tablet (40 mg total) by mouth daily before breakfast. 06/24/13   Radene Gunning, NP   BP 113/84  Pulse 114  Temp(Src) 97.5 F (36.4 C) (Oral)  Resp 16  Ht 5\' 6"  (1.676 m)  Wt 154 lb (69.854 kg)  BMI 24.87 kg/m2  SpO2 97% Physical Exam  Nursing note and vitals reviewed. Constitutional: She is oriented to person, place, and time. She appears well-developed and well-nourished.  HENT:  Head: Normocephalic and atraumatic.  Mouth/Throat: Oropharynx is clear and moist and mucous membranes are normal. Mucous membranes are not dry.  Eyes: EOM are normal.  Cardiovascular: Normal rate and normal heart sounds.  An irregular rhythm present.  No murmur heard. Pulmonary/Chest: Effort normal and breath sounds normal.  Abdominal: Soft. Bowel sounds are normal. She exhibits no distension. There is no  tenderness.  Musculoskeletal: She exhibits edema (bilateral, non-pitting ).  Neurological: She is alert and oriented to person, place, and time.  Skin: Skin is warm and dry.  Psychiatric: She has a normal mood and affect.    ED Course  Procedures (including critical care time)  DIAGNOSTIC STUDIES: Oxygen Saturation is 95% on RA, adequate by my interpretation.    COORDINATION OF CARE: 9:31 AM Discussed treatment plan with pt at bedside and pt agreed to plan.   Labs Review Labs Reviewed  COMPREHENSIVE METABOLIC PANEL - Abnormal; Notable for the following:    Potassium 2.4 (*)    Glucose, Bld 164 (*)    Albumin 3.4 (*)    GFR calc non Af Amer 49 (*)    GFR calc Af Amer 57 (*)    All other components within normal limits  CBC WITH DIFFERENTIAL - Abnormal; Notable for the following:    RBC 3.56 (*)    Hemoglobin 10.6 (*)    HCT 33.4 (*)    RDW 17.3 (*)    All other components within normal limits  URINALYSIS, ROUTINE W REFLEX MICROSCOPIC - Abnormal; Notable for the following:    Hgb urine dipstick SMALL (*)    Protein, ur 30 (*)    All other components within normal limits  TROPONIN I - Abnormal; Notable for the following:    Troponin I 0.37 (*)    All other components within normal limits  TROPONIN I - Abnormal; Notable for the following:    Troponin I 0.56 (*)    All other components within normal limits  PRO B NATRIURETIC PEPTIDE - Abnormal; Notable for the following:    Pro B Natriuretic peptide (BNP) 27715.0 (*)    All other components within normal limits  URINE MICROSCOPIC-ADD ON - Abnormal; Notable for the following:    Squamous Epithelial / LPF FEW (*)    Bacteria, UA FEW (*)    All other components within normal limits  LIPASE, BLOOD   Results for orders placed during the hospital encounter of 11/17/13  COMPREHENSIVE METABOLIC PANEL      Result Value Ref Range   Sodium 141  137 - 147 mEq/L   Potassium 2.4 (*) 3.7 - 5.3 mEq/L   Chloride 99  96 - 112 mEq/L    CO2 25  19 - 32 mEq/L   Glucose, Bld 164 (*) 70 - 99 mg/dL   BUN 10  6 - 23 mg/dL   Creatinine, Ser 1.00  0.50 - 1.10 mg/dL   Calcium 9.1  8.4 - 10.5 mg/dL   Total Protein 6.7  6.0 - 8.3 g/dL   Albumin 3.4 (*) 3.5 - 5.2 g/dL   AST 27  0 - 37 U/L   ALT 16  0 - 35 U/L   Alkaline Phosphatase 54  39 - 117 U/L   Total Bilirubin 0.8  0.3 - 1.2 mg/dL   GFR calc non Af Amer 49 (*) >90 mL/min   GFR calc  Af Amer 57 (*) >90 mL/min  LIPASE, BLOOD      Result Value Ref Range   Lipase 33  11 - 59 U/L  CBC WITH DIFFERENTIAL      Result Value Ref Range   WBC 4.1  4.0 - 10.5 K/uL   RBC 3.56 (*) 3.87 - 5.11 MIL/uL   Hemoglobin 10.6 (*) 12.0 - 15.0 g/dL   HCT 33.4 (*) 36.0 - 46.0 %   MCV 93.8  78.0 - 100.0 fL   MCH 29.8  26.0 - 34.0 pg   MCHC 31.7  30.0 - 36.0 g/dL   RDW 17.3 (*) 11.5 - 15.5 %   Platelets 249  150 - 400 K/uL   Neutrophils Relative % 77  43 - 77 %   Neutro Abs 3.2  1.7 - 7.7 K/uL   Lymphocytes Relative 16  12 - 46 %   Lymphs Abs 0.7  0.7 - 4.0 K/uL   Monocytes Relative 6  3 - 12 %   Monocytes Absolute 0.2  0.1 - 1.0 K/uL   Eosinophils Relative 1  0 - 5 %   Eosinophils Absolute 0.0  0.0 - 0.7 K/uL   Basophils Relative 0  0 - 1 %   Basophils Absolute 0.0  0.0 - 0.1 K/uL  URINALYSIS, ROUTINE W REFLEX MICROSCOPIC      Result Value Ref Range   Color, Urine YELLOW  YELLOW   APPearance CLEAR  CLEAR   Specific Gravity, Urine 1.010  1.005 - 1.030   pH 6.0  5.0 - 8.0   Glucose, UA NEGATIVE  NEGATIVE mg/dL   Hgb urine dipstick SMALL (*) NEGATIVE   Bilirubin Urine NEGATIVE  NEGATIVE   Ketones, ur NEGATIVE  NEGATIVE mg/dL   Protein, ur 30 (*) NEGATIVE mg/dL   Urobilinogen, UA 0.2  0.0 - 1.0 mg/dL   Nitrite NEGATIVE  NEGATIVE   Leukocytes, UA NEGATIVE  NEGATIVE  TROPONIN I      Result Value Ref Range   Troponin I 0.37 (*) <0.30 ng/mL  TROPONIN I      Result Value Ref Range   Troponin I 0.56 (*) <0.30 ng/mL  PRO B NATRIURETIC PEPTIDE      Result Value Ref Range   Pro B  Natriuretic peptide (BNP) 27715.0 (*) 0 - 450 pg/mL  URINE MICROSCOPIC-ADD ON      Result Value Ref Range   Squamous Epithelial / LPF FEW (*) RARE   RBC / HPF 3-6  <3 RBC/hpf   Bacteria, UA FEW (*) RARE     Imaging Review Dg Chest 2 View  11/17/2013   CLINICAL DATA:  Diarrhea  EXAM: CHEST  2 VIEW  COMPARISON:  07/16/2014  FINDINGS: Small right pleural effusion with associated lower lobe opacity, likely atelectasis. Possible trace left pleural effusion. No frank interstitial edema. No pneumothorax.  Cardiomegaly.  Degenerative changes of the visualized thoracolumbar spine.  IMPRESSION: Small right pleural effusion with associated lower lobe opacity, likely atelectasis.  Possible trace left pleural effusion.  No frank interstitial edema.   Electronically Signed   By: Julian Hy M.D.   On: 11/17/2013 11:47   Ct Abdomen Pelvis W Contrast  11/17/2013   CLINICAL DATA:  Diarrhea, appetite  EXAM: CT ABDOMEN AND PELVIS WITH CONTRAST  TECHNIQUE: Multidetector CT imaging of the abdomen and pelvis was performed using the standard protocol following bolus administration of intravenous contrast.  CONTRAST:  134mL OMNIPAQUE IOHEXOL 300 MG/ML  SOLN  COMPARISON:  DG ABD ACUTE  W/CHEST dated 02/19/2012  FINDINGS: There small bilateral pleural effusions. Heart is enlarged. No pericardial fluid.  There several low-density hepatic lesions consistent with benign cysts. Post cholecystectomy. The pancreas is mildly atrophic. No evidence duct dilatation or mass. The spleen is small. Adrenal glands are normal. Bilateral renal cortical thinning. No hydronephrosis.  Stomach, small bowel, appendix, and cecum are normal. There are diverticula scattered throughout the colon without evidence of acute inflammation.  Abdominal aorta is normal caliber. No retroperitoneal or periportal lymphadenopathy.  Small amount free fluid the pelvis. There are multiple large calcified and noncalcified masses within the uterus most consistent  leiomyoma. The largest noncalcified mass measures 8.0 cm. Two of the largest calcified masses measures 7.1 and 5.9 cm. The ovaries are small. No pelvic lymphadenopathy. No aggressive osseous lesion.  IMPRESSION: 1. Bilateral pleural effusions and cardiomegaly suggest mild heart failure. 2. Renal cortical atrophy. 3. Colonic diverticulosis without evidence of diverticulitis. 4. Multiple large noncalcified and calcified masses in the uterus most consistent with leiomyomas. 5. Small umbilical hernia. 6. No evidence of bowel obstruction.   Electronically Signed   By: Suzy Bouchard M.D.   On: 11/17/2013 11:38     EKG Interpretation   Date/Time:  Tuesday November 17 2013 09:58:39 EDT Ventricular Rate:  135 PR Interval:    QRS Duration: 132 QT Interval:  366 QTC Calculation: 549 R Axis:   -26 Text Interpretation:  Undetermined rhythm Right bundle branch block T wave  abnormality, consider lateral ischemia Abnormal ECG When compared with ECG  of 23-Jun-2013 11:34, Current undetermined rhythm precludes rhythm  comparison, needs review T wave inversion now evident in Lateral leads  Premature ventricular complexes Sinus arrhythmia Confirmed by Nairi Oswald   MD, Lazara Grieser [phone]) on 11/17/2013 10:21:18 AM      CRITICAL CARE Performed by: Lynn Weaver Total critical care time: 40 Critical care time was exclusive of separately billable procedures and treating other patients. Critical care was necessary to treat or prevent imminent or life-threatening deterioration. Critical care was time spent personally by me on the following activities: development of treatment plan with patient and/or surrogate as well as nursing, discussions with consultants, evaluation of patient's response to treatment, examination of patient, obtaining history from patient or surrogate, ordering and performing treatments and interventions, ordering and review of laboratory studies, ordering and review of radiographic studies,  pulse oximetry and re-evaluation of patient's condition.    MDM   Final diagnoses:  Hypokalemia  Non-STEMI (non-ST elevated myocardial infarction)    Patient with excessive workup initially for her concern for the belly pain which is generalized weakness and did not feel very good. Patient was concerned about loose bowel movements to yesterday 1 today and some abdominal discomfort. So CT scan was done but additional broad-spectrum workup was done. Initially determined that she had significant and marked hypokalemia. At 10 mEq of potassium IV piggyback x2 and also 40 mEq orally. Patient's initial troponin was mildly elevated at 0.37. 2 hour repeat was up to 0.5. Consistent with a non-STEMI. Patient never had any chest pain. Suspect something occurred during the night. Aspirin given heparin to be started. Discussed with a sign cardiology at [facility] to be transferred there for a non-STEMI. Patient hemodynamically stable here. I personally performed the services described in this documentation, which was scribed in my presence. The recorded information has been reviewed and is accurate.    Lynn Kung, MD 11/17/13 1400

## 2013-11-17 NOTE — H&P (Signed)
Referring Physician:  [name] is an 80 y.o. female.                       Chief Complaint: Belly pain and diarrhea HPI: 80 year old female presented to area ED with complaint of belly pain and diarrhea but additional work up showed hypokalemia and elevated BNP with mild bilateral pleural effusions and cardiomegaly.  No chest pain per patient. Cardiac enzymes are slightly high consistent with CHF/cardiomyopathy.   Past Medical History  Diagnosis Date  . Hypertension   . Anemia       Past Surgical History  Procedure Laterality Date  . Cholecystectomy    . Cataract extraction w/phaco  05/29/2012    Procedure: CATARACT EXTRACTION PHACO AND INTRAOCULAR LENS PLACEMENT (IOC);  Surgeon: Tonny Branch, MD;  Location: [facility];  Service: Ophthalmology;  Laterality: Right;  CDE:18.72  . Colonoscopy N/A 06/24/2013    Significant diverticular disease. Large hemorrhoids. Terminal ileum normal. 2 rectosigmoid polyps (TA) removed. No old or fresh blood noted during exam. Likely had bleeding from diverticula and hemorrhoids. consider flex sig in 3 years.    Family History  Problem Relation Age of Onset  . Heart disease Father   . Colon cancer Sister     age 53   Social History:  reports that she has never smoked. She does not have any smokeless tobacco history on file. She reports that she does not drink alcohol or use illicit drugs.  Allergies: No Known Allergies  Medications Prior to Admission  Medication Sig Dispense Refill  . acetaminophen (TYLENOL) 325 MG tablet Take 325-650 mg by mouth daily. For pain      . ferrous sulfate 325 (65 FE) MG tablet Take 325 mg by mouth daily with breakfast.      . lisinopril (PRINIVIL,ZESTRIL) 10 MG tablet Take 10 mg by mouth daily.      . pantoprazole (PROTONIX) 40 MG tablet Take 1 tablet (40 mg total) by mouth daily before breakfast.  30 tablet  1  . potassium chloride (K-DUR) 10 MEQ tablet Take 10 mEq by mouth 3 (three) times a week.         Results for  orders placed during the hospital encounter of 11/17/13 (from the past 48 hour(s))  COMPREHENSIVE METABOLIC PANEL     Status: Abnormal   Collection Time    11/17/13  9:53 AM      Result Value Ref Range   Sodium 141  137 - 147 mEq/L   Potassium 2.4 (*) 3.7 - 5.3 mEq/L   Comment: CRITICAL RESULT CALLED TO, READ BACK BY AND VERIFIED WITH:     VOGLER,T AT 10:33AM ON 11/17/13 BY FESTERMAN,C   Chloride 99  96 - 112 mEq/L   CO2 25  19 - 32 mEq/L   Glucose, Bld 164 (*) 70 - 99 mg/dL   BUN 10  6 - 23 mg/dL   Creatinine, Ser 1.00  0.50 - 1.10 mg/dL   Calcium 9.1  8.4 - 10.5 mg/dL   Total Protein 6.7  6.0 - 8.3 g/dL   Albumin 3.4 (*) 3.5 - 5.2 g/dL   AST 27  0 - 37 U/L   ALT 16  0 - 35 U/L   Alkaline Phosphatase 54  39 - 117 U/L   Total Bilirubin 0.8  0.3 - 1.2 mg/dL   GFR calc non Af Amer 49 (*) >90 mL/min   GFR calc Af Amer 57 (*) >90 mL/min  Comment: (NOTE)     The eGFR has been calculated using the CKD EPI equation.     This calculation has not been validated in all clinical situations.     eGFR's persistently <90 mL/min signify possible Chronic Kidney     Disease.  LIPASE, BLOOD     Status: None   Collection Time    11/17/13  9:53 AM      Result Value Ref Range   Lipase 33  11 - 59 U/L  CBC WITH DIFFERENTIAL     Status: Abnormal   Collection Time    11/17/13  9:53 AM      Result Value Ref Range   WBC 4.1  4.0 - 10.5 K/uL   RBC 3.56 (*) 3.87 - 5.11 MIL/uL   Hemoglobin 10.6 (*) 12.0 - 15.0 g/dL   HCT 33.4 (*) 36.0 - 46.0 %   MCV 93.8  78.0 - 100.0 fL   MCH 29.8  26.0 - 34.0 pg   MCHC 31.7  30.0 - 36.0 g/dL   RDW 17.3 (*) 11.5 - 15.5 %   Platelets 249  150 - 400 K/uL   Neutrophils Relative % 77  43 - 77 %   Neutro Abs 3.2  1.7 - 7.7 K/uL   Lymphocytes Relative 16  12 - 46 %   Lymphs Abs 0.7  0.7 - 4.0 K/uL   Monocytes Relative 6  3 - 12 %   Monocytes Absolute 0.2  0.1 - 1.0 K/uL   Eosinophils Relative 1  0 - 5 %   Eosinophils Absolute 0.0  0.0 - 0.7 K/uL   Basophils  Relative 0  0 - 1 %   Basophils Absolute 0.0  0.0 - 0.1 K/uL  TROPONIN I     Status: Abnormal   Collection Time    11/17/13  9:53 AM      Result Value Ref Range   Troponin I 0.37 (*) <0.30 ng/mL   Comment: CRITICAL RESULT CALLED TO, READ BACK BY AND VERIFIED WITH:     TRAVINO,B AT 10:30AM ON 11/17/13 BY FESTERMAN,C                Due to the release kinetics of cTnI,     a negative result within the first hours     of the onset of symptoms does not rule out     myocardial infarction with certainty.     If myocardial infarction is still suspected,     repeat the test at appropriate intervals.  URINALYSIS, ROUTINE W REFLEX MICROSCOPIC     Status: Abnormal   Collection Time    11/17/13 11:23 AM      Result Value Ref Range   Color, Urine YELLOW  YELLOW   APPearance CLEAR  CLEAR   Specific Gravity, Urine 1.010  1.005 - 1.030   pH 6.0  5.0 - 8.0   Glucose, UA NEGATIVE  NEGATIVE mg/dL   Hgb urine dipstick SMALL (*) NEGATIVE   Bilirubin Urine NEGATIVE  NEGATIVE   Ketones, ur NEGATIVE  NEGATIVE mg/dL   Protein, ur 30 (*) NEGATIVE mg/dL   Urobilinogen, UA 0.2  0.0 - 1.0 mg/dL   Nitrite NEGATIVE  NEGATIVE   Leukocytes, UA NEGATIVE  NEGATIVE  URINE MICROSCOPIC-ADD ON     Status: Abnormal   Collection Time    11/17/13 11:23 AM      Result Value Ref Range   Squamous Epithelial / LPF FEW (*) RARE   RBC / HPF  3-6  <3 RBC/hpf   Bacteria, UA FEW (*) RARE  TROPONIN I     Status: Abnormal   Collection Time    11/17/13 12:03 PM      Result Value Ref Range   Troponin I 0.56 (*) <0.30 ng/mL   Comment: CRITICAL VALUE NOTED.  VALUE IS CONSISTENT WITH PREVIOUSLY REPORTED AND CALLED VALUE.                Due to the release kinetics of cTnI,     a negative result within the first hours     of the onset of symptoms does not rule out     myocardial infarction with certainty.     If myocardial infarction is still suspected,     repeat the test at appropriate intervals.  PRO B NATRIURETIC PEPTIDE      Status: Abnormal   Collection Time    11/17/13 12:03 PM      Result Value Ref Range   Pro B Natriuretic peptide (BNP) 27715.0 (*) 0 - 450 pg/mL   Dg Chest 2 View  11/17/2013   CLINICAL DATA:  Diarrhea  EXAM: CHEST  2 VIEW  COMPARISON:  07/16/2014  FINDINGS: Small right pleural effusion with associated lower lobe opacity, likely atelectasis. Possible trace left pleural effusion. No frank interstitial edema. No pneumothorax.  Cardiomegaly.  Degenerative changes of the visualized thoracolumbar spine.  IMPRESSION: Small right pleural effusion with associated lower lobe opacity, likely atelectasis.  Possible trace left pleural effusion.  No frank interstitial edema.   Electronically Signed   By: Charline Bills M.D.   On: 11/17/2013 11:47   Ct Abdomen Pelvis W Contrast  11/17/2013   CLINICAL DATA:  Diarrhea, appetite  EXAM: CT ABDOMEN AND PELVIS WITH CONTRAST  TECHNIQUE: Multidetector CT imaging of the abdomen and pelvis was performed using the standard protocol following bolus administration of intravenous contrast.  CONTRAST:  OMNIPAQUE IOHEXOL 300 MG/ML  SOLN  COMPARISON:  DG ABD ACUTE W/CHEST dated 02/19/2012  FINDINGS: There small bilateral pleural effusions. Heart is enlarged. No pericardial fluid.  There several low-density hepatic lesions consistent with benign cysts. Post cholecystectomy. The pancreas is mildly atrophic. No evidence duct dilatation or mass. The spleen is small. Adrenal glands are normal. Bilateral renal cortical thinning. No hydronephrosis.  Stomach, small bowel, appendix, and cecum are normal. There are diverticula scattered throughout the colon without evidence of acute inflammation.  Abdominal aorta is normal caliber. No retroperitoneal or periportal lymphadenopathy.  Small amount free fluid the pelvis. There are multiple large calcified and noncalcified masses within the uterus most consistent leiomyoma. The largest noncalcified mass measures 8.0 cm. Two of the largest  calcified masses measures 7.1 and 5.9 cm. The ovaries are small. No pelvic lymphadenopathy. No aggressive osseous lesion.  IMPRESSION: 1. Bilateral pleural effusions and cardiomegaly suggest mild heart failure. 2. Renal cortical atrophy. 3. Colonic diverticulosis without evidence of diverticulitis. 4. Multiple large noncalcified and calcified masses in the uterus most consistent with leiomyomas. 5. Small umbilical hernia. 6. No evidence of bowel obstruction.   Electronically Signed   By: Genevive Bi M.D.   On: 11/17/2013 11:38     Review of Systems  Constitutional: Positive for decreased appetite. Negative for fever and chills.  HENT: Negative for rhinorrhea and sore throat.  Eyes: Negative for visual disturbance.  Respiratory: Negative for cough and shortness of breath.  Cardiovascular: Positive for leg swelling. Negative for chest pain.  Gastrointestinal: Positive for nausea and diarrhea. Negative for  vomiting, abdominal pain, blood in stool and anal bleeding.  Genitourinary: Negative for dysuria.  Musculoskeletal: Negative for back pain and neck pain.  Skin: Negative for rash.  Neurological: Negative for headaches.  Hematological: Does not bruise/bleed easily.  Psychiatric/Behavioral: Negative for confusion   Blood pressure 120/102, pulse 125, temperature 98.1 F (36.7 C), temperature source Oral, resp. rate 24, height 5' 6"  (1.676 m), weight 71 kg (156 lb 8.4 oz), SpO2 100.00%. Physical Exam  Nursing note and vitals reviewed.  Constitutional: She appears well-developed and well-nourished.  HENT: Head: Normocephalic and atraumatic. Mouth/Throat: Oropharynx is clear and moist and mucous membranes are normal. Mucous membranes are not dry. Brown eyes: EOM are normal.  Cardiovascular: Normal rate and normal heart sounds. An irregular rhythm present. II/VI systolic murmur.  Pulmonary/Chest: Effort normal and bibasilar crackles.  Abdominal: Soft. Bowel sounds are normal. She exhibits no  distension. There is no tenderness.  Musculoskeletal: She exhibits edema of both ankle and 1/4 th way up lower legs.  Neurological: She is alert and oriented to person, place, and time.  Skin: Skin is warm and dry.  Psychiatric: She has a normal mood and affect.    Assessment/Plan Acute left heart systolic failure. Hypokalemia Diarrhea Hypertension Mild anemia  IV lasix, Echocardiogram, possible left and right heart cath in AM if stable.  Birdie Riddle 11/17/2013, 4:20 PM

## 2013-11-17 NOTE — ED Notes (Signed)
Patient transported to CT 

## 2013-11-17 NOTE — Progress Notes (Signed)
ANTICOAGULATION CONSULT NOTE - Initial Consult  Pharmacy Consult for Heparin Indication: chest pain/ACS  No Known Allergies  Patient Measurements: Height: 5\' 6"  (167.6 cm) Weight: 154 lb (69.854 kg) IBW/kg (Calculated) : 59.3 Heparin Dosing Weight: 70kg  Vital Signs: Temp: 97.5 F (36.4 C) (04/28 1258) Temp src: Oral (04/28 1258) BP: 113/84 mmHg (04/28 1336) Pulse Rate: 114 (04/28 1336)  Labs:  Recent Labs  11/17/13 0953 11/17/13 1203  HGB 10.6*  --   HCT 33.4*  --   PLT 249  --   CREATININE 1.00  --   TROPONINI 0.37* 0.56*    Estimated Creatinine Clearance: 37.1 ml/min (by C-G formula based on Cr of 1).   Medical History: Past Medical History  Diagnosis Date  . Hypertension   . Anemia     Medications:  Scheduled:  . aspirin  324 mg Oral Once  . heparin  4,000 Units Intravenous Once    Assessment: 80 yo F admitted with NSTEMI.  Troponin elevated.  Hypokalemia noted- repleting.  No bleeding noted.  CBC reviewed.  Transferring to [facility].  Goal of Therapy:  Heparin level 0.3-0.7 units/ml Monitor platelets by anticoagulation protocol: Yes   Plan:  Give 4000 units bolus x 1 Heparin infusion at 800 units/hr (~12 units/kg/hr) Check 8hr heparin level after infusion starts Daily heparin level & CBC while on heparin  Lavonia Drafts Makynzi Eastland 11/17/2013,2:10 PM

## 2013-11-17 NOTE — ED Notes (Signed)
CRITICAL VALUE ALERT  Critical value received:  potassium  Date of notification:  11/17/13  Time of notification:  1950  Critical value read back:yes  Nurse who received alert:  Stormy Fabian  MD notified (1st page):  Zackowski  Time of first page:  1034  MD notified (2nd page):  Time of second page:  Responding MD:    Time MD responded:

## 2013-11-17 NOTE — ED Notes (Signed)
Pt reports hx of polyps, states has had diarrhea. Denies abdominal pain. States she cannot eat, no appetite.

## 2013-11-17 NOTE — ED Notes (Signed)
Attempted report 

## 2013-11-17 NOTE — Progress Notes (Signed)
Utilization Review Completed.[name]4/28/2015  

## 2013-11-18 ENCOUNTER — Inpatient Hospital Stay (HOSPITAL_COMMUNITY): Payer: Medicare PPO

## 2013-11-18 ENCOUNTER — Ambulatory Visit: Payer: Medicare PPO | Admitting: Gastroenterology

## 2013-11-18 LAB — BASIC METABOLIC PANEL
BUN: 14 mg/dL (ref 6–23)
CO2: 19 mEq/L (ref 19–32)
Calcium: 8.9 mg/dL (ref 8.4–10.5)
Chloride: 100 mEq/L (ref 96–112)
Creatinine, Ser: 1.44 mg/dL — ABNORMAL HIGH (ref 0.50–1.10)
GFR, EST AFRICAN AMERICAN: 37 mL/min — AB (ref >90)
GFR, EST NON AFRICAN AMERICAN: 32 mL/min — AB (ref >90)
Glucose, Bld: 127 mg/dL — ABNORMAL HIGH (ref 70–99)
POTASSIUM: 5.3 meq/L (ref 3.7–5.3)
Sodium: 140 mEq/L (ref 137–147)

## 2013-11-18 LAB — CBC
HEMATOCRIT: 35.1 % — AB (ref 36.0–46.0)
HEMOGLOBIN: 11.4 g/dL — AB (ref 12.0–15.0)
MCH: 30.5 pg (ref 26.0–34.0)
MCHC: 32.5 g/dL (ref 30.0–36.0)
MCV: 93.9 fL (ref 78.0–100.0)
Platelets: 271 10*3/uL (ref 150–400)
RBC: 3.74 MIL/uL — ABNORMAL LOW (ref 3.87–5.11)
RDW: 17.6 % — AB (ref 11.5–15.5)
WBC: 4.9 10*3/uL (ref 4.0–10.5)

## 2013-11-18 LAB — IRON AND TIBC
IRON: 80 ug/dL (ref 42–135)
Saturation Ratios: 34 % (ref 20–55)
TIBC: 232 ug/dL — AB (ref 250–470)
UIBC: 152 ug/dL (ref 125–400)

## 2013-11-18 LAB — FERRITIN: FERRITIN: 1274 ng/mL — AB (ref 10–291)

## 2013-11-18 LAB — CLOSTRIDIUM DIFFICILE BY PCR: Toxigenic C. Difficile by PCR: NEGATIVE

## 2013-11-18 LAB — OCCULT BLOOD X 1 CARD TO LAB, STOOL
FECAL OCCULT BLD: NEGATIVE
Fecal Occult Bld: NEGATIVE
Fecal Occult Bld: NEGATIVE

## 2013-11-18 LAB — TROPONIN I: TROPONIN I: 0.63 ng/mL — AB (ref <0.30)

## 2013-11-18 MED ORDER — DOPAMINE-DEXTROSE 3.2-5 MG/ML-% IV SOLN
5.0000 ug/kg/min | INTRAVENOUS | Status: DC
Start: 2013-11-18 — End: 2013-11-19
  Administered 2013-11-18: 2.5 ug/kg/min via INTRAVENOUS
  Filled 2013-11-18: qty 250

## 2013-11-18 MED ORDER — FUROSEMIDE 10 MG/ML IJ SOLN
40.0000 mg | Freq: Once | INTRAMUSCULAR | Status: AC
  Administered 2013-11-18: 40 mg via INTRAVENOUS

## 2013-11-18 MED ORDER — DIGOXIN 250 MCG PO TABS
0.2500 mg | ORAL_TABLET | Freq: Every day | ORAL | Status: DC
  Administered 2013-11-18 – 2013-11-20 (×3): 0.25 mg via ORAL
  Filled 2013-11-18 (×3): qty 1

## 2013-11-18 MED ORDER — ALBUTEROL SULFATE (2.5 MG/3ML) 0.083% IN NEBU
2.5000 mg | INHALATION_SOLUTION | Freq: Four times a day (QID) | RESPIRATORY_TRACT | Status: DC
  Administered 2013-11-18: 2.5 mg via RESPIRATORY_TRACT
  Filled 2013-11-18: qty 3

## 2013-11-18 MED ORDER — FUROSEMIDE 10 MG/ML IJ SOLN
INTRAMUSCULAR | Status: AC
  Filled 2013-11-18: qty 4

## 2013-11-18 MED ORDER — DIGOXIN 0.25 MG/ML IJ SOLN: 0.2500 mg | Freq: Every day | INTRAMUSCULAR | Status: DC

## 2013-11-18 MED ORDER — SODIUM CHLORIDE 0.9 % IV BOLUS (SEPSIS)
250.0000 mL | Freq: Once | INTRAVENOUS | Status: AC
  Administered 2013-11-18: 250 mL via INTRAVENOUS

## 2013-11-18 NOTE — Progress Notes (Signed)
INITIAL NUTRITION ASSESSMENT  DOCUMENTATION CODES Per approved criteria  -Not Applicable   INTERVENTION: 1.  General healthful diet; encourage intake of foods and beverages as able.  RD to follow and assess for nutritional adequacy. Will consider adding supplements based on medical course medical plan.   NUTRITION DIAGNOSIS: Inadequate oral intake related to poor appetite as evidenced by pt report.   Monitor:  1.  Food/Beverage; pt meeting >/=90% estimated needs with tolerance. 2.  Wt/wt change; monitor trends  Reason for Assessment: MST  80 y.o. female  Admitting Dx: abdominal pain  ASSESSMENT: Pt admitted with abdominal pain and diarrhea.  Found to have CHF/cardiomyopathy. Pt is awake and alert in bed, however becomes confused over the course of the visit.  Pt initially answering questions and requests applesauce and peaches.  Pt states that she has not been eating well for the past few days- states she has been "eating light."  She denies recent weight change.  Per chart review, pt with hx of 8% wt loss over the past 1 year, with stability for the past 6 months. Per RN, pt has been refusing meals today.  RD provided snack for pt and assisted with setup to eat.  Pt took a bite of applesauce held it on her tongue refusing to swallow despite cuing.  She became restless, setting the cup down and picking it back up several times asking "where's my spoon?"  Pt stated she needed to go to the restroom, but overall was SOB and restless. RN to room to assist patient.   RD suspects pt with acute appetite loss r/t to chest pain and shortness of breath.  Will continue to follow for care plan and improvement in intake.   Nutrition Focused Physical Exam: Subcutaneous Fat:  Orbital Region: WNL Upper Arm Region: WNL Thoracic and Lumbar Region: WNL  Muscle:  Temple Region: WNL Clavicle Bone Region: WNL Clavicle and Acromion Bone Region: WNL Scapular Bone Region: WNL Dorsal Hand:  WNL Patellar Region: WNL Anterior Thigh Region: WNL Posterior Calf Region: WNL  Edema: none present   Height: Ht Readings from Last 1 Encounters:  11/17/13 5\' 6"  (1.676 m)    Weight: Wt Readings from Last 1 Encounters:  11/18/13 158 lb 15.2 oz (72.1 kg)    Ideal Body Weight: 130 lbs  % Ideal Body Weight: 121%  Wt Readings from Last 10 Encounters:  11/18/13 158 lb 15.2 oz (72.1 kg)  08/06/13 151 lb 12.8 oz (68.856 kg)  07/16/13 160 lb (72.576 kg)  06/24/13 159 lb (72.122 kg)  06/24/13 159 lb (72.122 kg)  05/26/12 164 lb (74.39 kg)  07/16/11 160 lb (72.576 kg)    Usual Body Weight: 165 lbs  % Usual Body Weight: 95%  BMI:  Body mass index is 25.67 kg/(m^2).  Estimated Nutritional Needs: Kcal: 1600-1800 Protein: 65-80g Fluid: ~1.8 L/day  Skin: intact  Diet Order: Cardiac  EDUCATION NEEDS: -Education needs addressed   Intake/Output Summary (Last 24 hours) at 11/18/13 1437 Last data filed at 11/18/13 1300  Gross per 24 hour  Intake 918.17 ml  Output      0 ml  Net 918.17 ml    Last BM: 4/29  Labs:   Recent Labs Lab 11/17/13 0953 11/17/13 1755 11/18/13 0755  NA 141 141 140  K 2.4* 3.5* 5.3  CL 99 103 100  CO2 25 19 19   BUN 10 10 14   CREATININE 1.00 0.93 1.44*  CALCIUM 9.1 8.5 8.9  GLUCOSE 164* 124* 127*  CBG (last 3)  No results found for this basename: GLUCAP,  in the last 72 hours  Scheduled Meds: . aspirin EC  81 mg Oral Daily  . diltiazem  30 mg Oral 4 times per day  . ferrous sulfate  325 mg Oral Q breakfast  . heparin  5,000 Units Subcutaneous 3 times per day  . lisinopril  10 mg Oral Daily  . metoprolol tartrate  12.5 mg Oral BID  . pantoprazole  40 mg Oral QAC breakfast  . pneumococcal 23 valent vaccine  0.5 mL Intramuscular Tomorrow-1000  . sodium chloride  3 mL Intravenous Q12H    Continuous Infusions:   Past Medical History  Diagnosis Date  . Hypertension   . Anemia     Past Surgical History  Procedure  Laterality Date  . Cholecystectomy    . Cataract extraction w/phaco  05/29/2012    Procedure: CATARACT EXTRACTION PHACO AND INTRAOCULAR LENS PLACEMENT (IOC);  Surgeon: Tonny Branch, MD;  Location: [facility];  Service: Ophthalmology;  Laterality: Right;  CDE:18.72  . Colonoscopy N/A 06/24/2013    Significant diverticular disease. Large hemorrhoids. Terminal ileum normal. 2 rectosigmoid polyps (TA) removed. No old or fresh blood noted during exam. Likely had bleeding from diverticula and hemorrhoids. consider flex sig in 3 years.    Brynda Greathouse, MS RD LDN Clinical Inpatient Dietitian Pager: [phone] Weekend/After hours pager: [phone]

## 2013-11-18 NOTE — Progress Notes (Signed)
[facility]  2D Echocardiogram completed.  [place], RDCS 11/18/2013 8:36 AM

## 2013-11-18 NOTE — Progress Notes (Signed)
Notified Dr. Doylene Canard that pt still has not urinated from 40 IV lasix at 1430, despite NS bolus and dopamine infusion. Pt also now wheezing. New order for nebs. Will continue to monitor closely.

## 2013-11-18 NOTE — Progress Notes (Signed)
Patient reported chest pain to RN. On assessment, patient noted to be short of breath and diaphoretic. Vital signs taken, 12 lead EKG obtained, and oxygen at 2 liters applied. Dr Doylene Canard notified. Lasix given per verbal order. Will continue to monitor closely.

## 2013-11-18 NOTE — Progress Notes (Addendum)
Dr Doylene Canard notified of chest pain and increasing shob. Venti mask applied. Notified patient has not voided since administration of Lasix IV. Bladder scan volume -45 mls. Will monitor closely.

## 2013-11-18 NOTE — Progress Notes (Signed)
Ref: Leonides Grills, MD   Subjective:  Patient mildly confused. Poor LV systolic function with severe MR and TR. Decreasing urine output. Afebrile.  Objective:  Vital Signs in the last 24 hours: Temp:  [97.9 F (36.6 C)-98.7 F (37.1 C)] 98 F (36.7 C) (04/29 1701) Pulse Rate:  [33-103] 34 (04/29 1900) Cardiac Rhythm:  [-] Normal sinus rhythm (04/29 1700) Resp:  [16-30] 16 (04/29 1900) BP: (82-126)/(44-91) 109/67 mmHg (04/29 1900) SpO2:  [85 %-100 %] 85 % (04/29 1900) FiO2 (%):  [40 %] 40 % (04/29 1805) Weight:  [72.1 kg (158 lb 15.2 oz)] 72.1 kg (158 lb 15.2 oz) (04/29 0500)  Physical Exam: BP Readings from Last 1 Encounters:  11/18/13 109/67    Wt Readings from Last 1 Encounters:  11/18/13 72.1 kg (158 lb 15.2 oz)    Weight change:   HEENT: [place]/AT, Eyes-Brown, PERL, EOMI, Conjunctiva-Pink, Sclera-Non-icteric Neck: No JVD, No bruit, Trachea midline. Lungs:  Crackles, Bilateral. Cardiac:  Irregular rhythm, normal S1 and S2, no S3. II/VI systolic murmur Abdomen:  Soft, non-tender. Extremities:  1 + ankle edema present. No cyanosis. No clubbing. CNS: AxOx1, Cranial nerves grossly intact, moves all 4 extremities. Right handed. Skin: Warm and dry.   Intake/Output from previous day: 04/28 0701 - 04/29 0700 In: 738.2 [P.O.:700; I.V.:38.2] Out: -     Lab Results: BMET    Component Value Date/Time   NA 140 11/18/2013 0755   NA 141 11/17/2013 1755   NA 141 11/17/2013 0953   K 5.3 11/18/2013 0755   K 3.5* 11/17/2013 1755   K 2.4* 11/17/2013 0953   CL 100 11/18/2013 0755   CL 103 11/17/2013 1755   CL 99 11/17/2013 0953   CO2 19 11/18/2013 0755   CO2 19 11/17/2013 1755   CO2 25 11/17/2013 0953   GLUCOSE 127* 11/18/2013 0755   GLUCOSE 124* 11/17/2013 1755   GLUCOSE 164* 11/17/2013 0953   BUN 14 11/18/2013 0755   BUN 10 11/17/2013 1755   BUN 10 11/17/2013 0953   CREATININE 1.44* 11/18/2013 0755   CREATININE 0.93 11/17/2013 1755   CREATININE 1.00 11/17/2013 0953   CALCIUM 8.9  11/18/2013 0755   CALCIUM 8.5 11/17/2013 1755   CALCIUM 9.1 11/17/2013 0953   GFRNONAA 32* 11/18/2013 0755   GFRNONAA 54* 11/17/2013 1755   GFRNONAA 49* 11/17/2013 0953   GFRAA 37* 11/18/2013 0755   GFRAA 62* 11/17/2013 1755   GFRAA 57* 11/17/2013 0953   CBC    Component Value Date/Time   WBC 4.9 11/18/2013 0755   RBC 3.74* 11/18/2013 0755   HGB 11.4* 11/18/2013 0755   HCT 35.1* 11/18/2013 0755   PLT 271 11/18/2013 0755   MCV 93.9 11/18/2013 0755   MCH 30.5 11/18/2013 0755   MCHC 32.5 11/18/2013 0755   RDW 17.6* 11/18/2013 0755   LYMPHSABS 0.7 11/17/2013 0953   MONOABS 0.2 11/17/2013 0953   EOSABS 0.0 11/17/2013 0953   BASOSABS 0.0 11/17/2013 0953   HEPATIC Function Panel  Recent Labs  06/23/13 0914 06/24/13 0637 11/17/13 0953  PROT 6.6 5.7* 6.7   HEMOGLOBIN A1C No components found with this basename: HGA1C,  MPG   CARDIAC ENZYMES Lab Results  Component Value Date   TROPONINI 0.63* 11/18/2013   TROPONINI 0.52* 11/17/2013   TROPONINI 0.45* 11/17/2013   BNP  Recent Labs  11/17/13 1203  PROBNP 27715.0*   TSH  Recent Labs  11/17/13 1755  TSH 3.000   CHOLESTEROL No results found for this basename: CHOL,  in the last 8760 hours  Scheduled Meds: . aspirin EC  81 mg Oral Daily  . digoxin  0.25 mg Oral Daily  . ferrous sulfate  325 mg Oral Q breakfast  . heparin  5,000 Units Subcutaneous 3 times per day  . metoprolol tartrate  12.5 mg Oral BID  . pantoprazole  40 mg Oral QAC breakfast  . pneumococcal 23 valent vaccine  0.5 mL Intramuscular Tomorrow-1000  . sodium chloride  250 mL Intravenous Once  . sodium chloride  3 mL Intravenous Q12H   Continuous Infusions: . DOPamine 2.5 mcg/kg/min (11/18/13 1930)   PRN Meds:.sodium chloride, acetaminophen, loperamide, ondansetron (ZOFRAN) IV, sodium chloride  Assessment/Plan: Acute biventricular systolic failure.  Hypokalemia  Diarrhea  Hypertension  Mild anemia Acute renal failure  Try inotropic support to improve heart  rate and blood pressure. Prognosis appears poor. Will ask family to consider comfort measures if no improvement. CXR, portable.     LOS: 1 day    Dixie Dials  MD  11/18/2013, 8:33 PM

## 2013-11-19 LAB — BASIC METABOLIC PANEL
BUN: 22 mg/dL (ref 6–23)
CO2: 13 mEq/L — ABNORMAL LOW (ref 19–32)
Calcium: 9.2 mg/dL (ref 8.4–10.5)
Chloride: 99 mEq/L (ref 96–112)
Creatinine, Ser: 2.12 mg/dL — ABNORMAL HIGH (ref 0.50–1.10)
GFR, EST AFRICAN AMERICAN: 23 mL/min — AB (ref >90)
GFR, EST NON AFRICAN AMERICAN: 20 mL/min — AB (ref >90)
GLUCOSE: 74 mg/dL (ref 70–99)
Potassium: 5.9 mEq/L — ABNORMAL HIGH (ref 3.7–5.3)
Sodium: 140 mEq/L (ref 137–147)

## 2013-11-19 LAB — CBC
HCT: 34.4 % — ABNORMAL LOW (ref 36.0–46.0)
Hemoglobin: 10.7 g/dL — ABNORMAL LOW (ref 12.0–15.0)
MCH: 30.1 pg (ref 26.0–34.0)
MCHC: 31.1 g/dL (ref 30.0–36.0)
MCV: 96.9 fL (ref 78.0–100.0)
Platelets: 236 10*3/uL (ref 150–400)
RBC: 3.55 MIL/uL — ABNORMAL LOW (ref 3.87–5.11)
RDW: 17.9 % — ABNORMAL HIGH (ref 11.5–15.5)
WBC: 11.7 10*3/uL — ABNORMAL HIGH (ref 4.0–10.5)

## 2013-11-19 MED ORDER — ALBUTEROL SULFATE (2.5 MG/3ML) 0.083% IN NEBU
2.5000 mg | INHALATION_SOLUTION | Freq: Three times a day (TID) | RESPIRATORY_TRACT | Status: DC
  Administered 2013-11-19 – 2013-11-21 (×7): 2.5 mg via RESPIRATORY_TRACT
  Filled 2013-11-19 (×7): qty 3

## 2013-11-19 MED ORDER — SODIUM POLYSTYRENE SULFONATE 15 GM/60ML PO SUSP
30.0000 g | Freq: Once | ORAL | Status: AC
  Administered 2013-11-19: 30 g via ORAL
  Filled 2013-11-19: qty 120

## 2013-11-19 MED ORDER — SODIUM CHLORIDE 0.9 % IV BOLUS (SEPSIS)
1000.0000 mL | Freq: Once | INTRAVENOUS | Status: AC
  Administered 2013-11-19: 1000 mL via INTRAVENOUS

## 2013-11-19 NOTE — Progress Notes (Signed)
Ref: Lynn Grills, MD   Subjective:  Sitting up and following commands but confused overall. Very little urine output. Afebrile.  Objective:  Vital Signs in the last 24 hours: Temp:  [97.7 F (36.5 C)-98 F (36.7 C)] 97.7 F (36.5 C) (04/30 0300) Pulse Rate:  [33-103] 86 (04/30 0730) Cardiac Rhythm:  [-] Normal sinus rhythm (04/30 0730) Resp:  [15-30] 17 (04/30 0730) BP: (82-139)/(35-91) 91/70 mmHg (04/30 0730) SpO2:  [85 %-100 %] 96 % (04/30 0730) FiO2 (%):  [40 %-45 %] 45 % (04/29 2000) Weight:  [70.1 kg (154 lb 8.7 oz)] 70.1 kg (154 lb 8.7 oz) (04/30 0500)  Physical Exam: BP Readings from Last 1 Encounters:  11/19/13 91/70    Wt Readings from Last 1 Encounters:  11/19/13 70.1 kg (154 lb 8.7 oz)    Weight change: 0.246 kg (8.7 oz)  HEENT: [place]/AT, Eyes-Brown, PERL, EOMI, Conjunctiva-Pale pink, Sclera-Non-icteric Neck: + JVD, No bruit, Trachea midline. Lungs:  Clear, Bilateral. Cardiac:  Regular rhythm, normal S1 and S2, no S3. II/VI systolic murmur. Abdomen:  Soft, non-tender. Extremities:  1 + ankle edema present. No cyanosis. No clubbing. CNS: AxOx3, Cranial nerves grossly intact, moves all 4 extremities. Right handed. Skin: Warm and dry.   Intake/Output from previous day: 04/29 0701 - 04/30 0700 In: 719.6 [P.O.:280; I.V.:189.6; IV Piggyback:250] Out: 5 [Urine:5]    Lab Results: BMET    Component Value Date/Time   NA 140 11/19/2013 0315   NA 140 11/18/2013 0755   NA 141 11/17/2013 1755   K 5.9* 11/19/2013 0315   K 5.3 11/18/2013 0755   K 3.5* 11/17/2013 1755   CL 99 11/19/2013 0315   CL 100 11/18/2013 0755   CL 103 11/17/2013 1755   CO2 13* 11/19/2013 0315   CO2 19 11/18/2013 0755   CO2 19 11/17/2013 1755   GLUCOSE 74 11/19/2013 0315   GLUCOSE 127* 11/18/2013 0755   GLUCOSE 124* 11/17/2013 1755   BUN 22 11/19/2013 0315   BUN 14 11/18/2013 0755   BUN 10 11/17/2013 1755   CREATININE 2.12* 11/19/2013 0315   CREATININE 1.44* 11/18/2013 0755   CREATININE 0.93  11/17/2013 1755   CALCIUM 9.2 11/19/2013 0315   CALCIUM 8.9 11/18/2013 0755   CALCIUM 8.5 11/17/2013 1755   GFRNONAA 20* 11/19/2013 0315   GFRNONAA 32* 11/18/2013 0755   GFRNONAA 54* 11/17/2013 1755   GFRAA 23* 11/19/2013 0315   GFRAA 37* 11/18/2013 0755   GFRAA 62* 11/17/2013 1755   CBC    Component Value Date/Time   WBC 11.7* 11/19/2013 0315   RBC 3.55* 11/19/2013 0315   HGB 10.7* 11/19/2013 0315   HCT 34.4* 11/19/2013 0315   PLT 236 11/19/2013 0315   MCV 96.9 11/19/2013 0315   MCH 30.1 11/19/2013 0315   MCHC 31.1 11/19/2013 0315   RDW 17.9* 11/19/2013 0315   LYMPHSABS 0.7 11/17/2013 0953   MONOABS 0.2 11/17/2013 0953   EOSABS 0.0 11/17/2013 0953   BASOSABS 0.0 11/17/2013 0953   HEPATIC Function Panel  Recent Labs  06/23/13 0914 06/24/13 0637 11/17/13 0953  PROT 6.6 5.7* 6.7   HEMOGLOBIN A1C No components found with this basename: HGA1C,  MPG   CARDIAC ENZYMES Lab Results  Component Value Date   TROPONINI 0.63* 11/18/2013   TROPONINI 0.52* 11/17/2013   TROPONINI 0.45* 11/17/2013   BNP  Recent Labs  11/17/13 1203  PROBNP 27715.0*   TSH  Recent Labs  11/17/13 1755  TSH 3.000   CHOLESTEROL No results found for  this basename: CHOL,  in the last 8760 hours  Scheduled Meds: . albuterol  2.5 mg Nebulization TID  . aspirin EC  81 mg Oral Daily  . digoxin  0.25 mg Oral Daily  . ferrous sulfate  325 mg Oral Q breakfast  . heparin  5,000 Units Subcutaneous 3 times per day  . metoprolol tartrate  12.5 mg Oral BID  . pantoprazole  40 mg Oral QAC breakfast  . pneumococcal 23 valent vaccine  0.5 mL Intramuscular Tomorrow-1000  . sodium chloride  1,000 mL Intravenous Once  . sodium chloride  3 mL Intravenous Q12H   Continuous Infusions: . DOPamine 5 mcg/kg/min (11/18/13 2033)   PRN Meds:.sodium chloride, acetaminophen, loperamide, ondansetron (ZOFRAN) IV, sodium chloride  Assessment/Plan: Acute biventricular systolic failure.   Hypertension  Mild anemia  Acute renal  failure Hyperkalemia due to above  Try IV fluid with inotropic support.    LOS: 2 days    Dixie Dials  MD  11/19/2013, 8:00 AM

## 2013-11-20 LAB — CBC
HCT: 31.7 % — ABNORMAL LOW (ref 36.0–46.0)
Hemoglobin: 10.5 g/dL — ABNORMAL LOW (ref 12.0–15.0)
MCH: 30.1 pg (ref 26.0–34.0)
MCHC: 33.1 g/dL (ref 30.0–36.0)
MCV: 90.8 fL (ref 78.0–100.0)
Platelets: 269 10*3/uL (ref 150–400)
RBC: 3.49 MIL/uL — AB (ref 3.87–5.11)
RDW: 17.8 % — ABNORMAL HIGH (ref 11.5–15.5)
WBC: 8.8 10*3/uL (ref 4.0–10.5)

## 2013-11-20 LAB — BASIC METABOLIC PANEL
BUN: 33 mg/dL — ABNORMAL HIGH (ref 6–23)
CO2: 19 meq/L (ref 19–32)
Calcium: 8 mg/dL — ABNORMAL LOW (ref 8.4–10.5)
Chloride: 100 mEq/L (ref 96–112)
Creatinine, Ser: 1.99 mg/dL — ABNORMAL HIGH (ref 0.50–1.10)
GFR calc Af Amer: 25 mL/min — ABNORMAL LOW (ref >90)
GFR calc non Af Amer: 21 mL/min — ABNORMAL LOW (ref >90)
GLUCOSE: 82 mg/dL (ref 70–99)
POTASSIUM: 5 meq/L (ref 3.7–5.3)
SODIUM: 137 meq/L (ref 137–147)

## 2013-11-20 MED ORDER — DEXTROSE-NACL 5-0.45 % IV SOLN: INTRAVENOUS | Status: DC

## 2013-11-20 MED ORDER — DIGOXIN 125 MCG PO TABS
0.1250 mg | ORAL_TABLET | Freq: Every day | ORAL | Status: DC
Start: 2013-11-21 — End: 2013-11-24
  Administered 2013-11-21 – 2013-11-24 (×4): 0.125 mg via ORAL
  Filled 2013-11-20 (×4): qty 1

## 2013-11-20 MED ORDER — DEXTROSE-NACL 5-0.45 % IV SOLN
INTRAVENOUS | Status: AC
  Administered 2013-11-20 – 2013-11-21 (×2): via INTRAVENOUS

## 2013-11-20 NOTE — Progress Notes (Signed)
Ref: Leonides Grills, MD   Subjective:  Less confused today and sitting up, asking if she could go home.  Afebrile. Improved urine output and renal function  Objective:  Vital Signs in the last 24 hours: Temp:  [98.3 F (36.8 C)-98.8 F (37.1 C)] 98.7 F (37.1 C) (05/01 0747) Pulse Rate:  [66-89] 66 (05/01 0747) Cardiac Rhythm:  [-] Normal sinus rhythm (05/01 0750) Resp:  [13-25] 17 (05/01 0747) BP: (108-148)/(49-108) 109/77 mmHg (05/01 0747) SpO2:  [91 %-98 %] 97 % (05/01 0750) Weight:  [72.8 kg (160 lb 7.9 oz)] 72.8 kg (160 lb 7.9 oz) (05/01 0500)  Physical Exam: BP Readings from Last 1 Encounters:  11/20/13 109/77    Wt Readings from Last 1 Encounters:  11/20/13 72.8 kg (160 lb 7.9 oz)    Weight change: 2.7 kg (5 lb 15.2 oz)  HEENT: [place]/AT, Eyes-Brown, PERL, EOMI, Conjunctiva-Pale pink, Sclera-Non-icteric Neck: + JVD, No bruit, Trachea midline. Lungs:  Clear, Bilateral. Cardiac:  Regular rhythm, normal S1 and S2, no S3. II/VI systolic murmur. Abdomen:  Soft, non-tender. Extremities:  1 + edema present. No cyanosis. No clubbing. CNS: AxOx3, Cranial nerves grossly intact, moves all 4 extremities. Right handed. Skin: Warm and dry.   Intake/Output from previous day: 04/30 0701 - 05/01 0700 In: 1592.1 [P.O.:540; I.V.:53.8; IV Piggyback:998.3] Out: 500 [Urine:500]    Lab Results: BMET    Component Value Date/Time   NA 137 11/20/2013 0540   NA 140 11/19/2013 0315   NA 140 11/18/2013 0755   K 5.0 11/20/2013 0540   K 5.9* 11/19/2013 0315   K 5.3 11/18/2013 0755   CL 100 11/20/2013 0540   CL 99 11/19/2013 0315   CL 100 11/18/2013 0755   CO2 19 11/20/2013 0540   CO2 13* 11/19/2013 0315   CO2 19 11/18/2013 0755   GLUCOSE 82 11/20/2013 0540   GLUCOSE 74 11/19/2013 0315   GLUCOSE 127* 11/18/2013 0755   BUN 33* 11/20/2013 0540   BUN 22 11/19/2013 0315   BUN 14 11/18/2013 0755   CREATININE 1.99* 11/20/2013 0540   CREATININE 2.12* 11/19/2013 0315   CREATININE 1.44* 11/18/2013 0755   CALCIUM 8.0* 11/20/2013 0540   CALCIUM 9.2 11/19/2013 0315   CALCIUM 8.9 11/18/2013 0755   GFRNONAA 21* 11/20/2013 0540   GFRNONAA 20* 11/19/2013 0315   GFRNONAA 32* 11/18/2013 0755   GFRAA 25* 11/20/2013 0540   GFRAA 23* 11/19/2013 0315   GFRAA 37* 11/18/2013 0755   CBC    Component Value Date/Time   WBC 8.8 11/20/2013 0540   RBC 3.49* 11/20/2013 0540   HGB 10.5* 11/20/2013 0540   HCT 31.7* 11/20/2013 0540   PLT 269 11/20/2013 0540   MCV 90.8 11/20/2013 0540   MCH 30.1 11/20/2013 0540   MCHC 33.1 11/20/2013 0540   RDW 17.8* 11/20/2013 0540   LYMPHSABS 0.7 11/17/2013 0953   MONOABS 0.2 11/17/2013 0953   EOSABS 0.0 11/17/2013 0953   BASOSABS 0.0 11/17/2013 0953   HEPATIC Function Panel  Recent Labs  06/23/13 0914 06/24/13 0637 11/17/13 0953  PROT 6.6 5.7* 6.7   HEMOGLOBIN A1C No components found with this basename: HGA1C,  MPG   CARDIAC ENZYMES Lab Results  Component Value Date   TROPONINI 0.63* 11/18/2013   TROPONINI 0.52* 11/17/2013   TROPONINI 0.45* 11/17/2013   BNP  Recent Labs  11/17/13 1203  PROBNP 27715.0*   TSH  Recent Labs  11/17/13 1755  TSH 3.000   CHOLESTEROL No results found for this basename: CHOL,  in the last 8760 hours  Scheduled Meds: . albuterol  2.5 mg Nebulization TID  . aspirin EC  81 mg Oral Daily  . digoxin  0.25 mg Oral Daily  . ferrous sulfate  325 mg Oral Q breakfast  . heparin  5,000 Units Subcutaneous 3 times per day  . metoprolol tartrate  12.5 mg Oral BID  . pantoprazole  40 mg Oral QAC breakfast  . pneumococcal 23 valent vaccine  0.5 mL Intramuscular Tomorrow-1000  . sodium chloride  3 mL Intravenous Q12H   Continuous Infusions: . dextrose 5 % and 0.45% NaCl 100 mL/hr at 11/20/13 1000   PRN Meds:.sodium chloride, acetaminophen, loperamide, ondansetron (ZOFRAN) IV, sodium chloride  Assessment/Plan: Acute biventricular systolic failure.  Hypertension  Mild anemia  Acute renal failure -improving Hyperkalemia -resolved   Try additional fluid  bolus to improve biventricular filling and renal function.   LOS: 3 days    Dixie Dials  MD  11/20/2013, 11:38 AM

## 2013-11-20 NOTE — Progress Notes (Signed)
NUTRITION FOLLOW UP  Intervention:   1.  General healthful diet; encourage intake of foods and beverages as able.  RD to follow and assess for nutritional adequacy.  2.  Supplements; Ensure Complete po prn, each supplement provides 350 kcal and 13 grams of protein  Nutrition Dx:   Inadequate oral intake related to poor appetite as evidenced by pt report.   Monitor:  1. Food/Beverage; pt meeting >/=90% estimated needs with tolerance.  2. Wt/wt change; monitor trends  Assessment:   Pt admitted with abdominal pain and diarrhea. Found to have CHF/cardiomyopathy.   Pt is sleeping soundly at time of visit, does not awaken to voice.  Discussed nutrition and intake with RN who states pt is eating per her usual.  She is also getting Ensure Complete prn.  RD will place order for Ensure and continue to follow for adequate intake.   Height: Ht Readings from Last 1 Encounters:  11/17/13 5\' 6"  (1.676 m)    Weight Status:   Wt Readings from Last 1 Encounters:  11/20/13 160 lb 7.9 oz (72.8 kg)    Re-estimated needs:  Kcal: 1191-4782 Protein: 87-94g Fluid: ~2.0 L/day  Skin: intact  Diet Order: Cardiac   Intake/Output Summary (Last 24 hours) at 11/20/13 1328 Last data filed at 11/20/13 1300  Gross per 24 hour  Intake 1049.8 ml  Output    950 ml  Net   99.8 ml    Last BM: 4/30  Labs:   Recent Labs Lab 11/18/13 0755 11/19/13 0315 11/20/13 0540  NA 140 140 137  K 5.3 5.9* 5.0  CL 100 99 100  CO2 19 13* 19  BUN 14 22 33*  CREATININE 1.44* 2.12* 1.99*  CALCIUM 8.9 9.2 8.0*  GLUCOSE 127* 74 82    CBG (last 3)  No results found for this basename: GLUCAP,  in the last 72 hours  Scheduled Meds: . albuterol  2.5 mg Nebulization TID  . aspirin EC  81 mg Oral Daily  . [START ON 11/21/2013] digoxin  0.125 mg Oral Daily  . ferrous sulfate  325 mg Oral Q breakfast  . heparin  5,000 Units Subcutaneous 3 times per day  . metoprolol tartrate  12.5 mg Oral BID  . pantoprazole  40  mg Oral QAC breakfast  . pneumococcal 23 valent vaccine  0.5 mL Intramuscular Tomorrow-1000  . sodium chloride  3 mL Intravenous Q12H    Continuous Infusions: . dextrose 5 % and 0.45% NaCl 100 mL/hr at 11/20/13 1000    Brynda Greathouse, MS RD LDN Clinical Inpatient Dietitian Pager: [phone] Weekend/After hours pager: [phone]

## 2013-11-21 LAB — CBC
HEMATOCRIT: 29.3 % — AB (ref 36.0–46.0)
HEMOGLOBIN: 9.7 g/dL — AB (ref 12.0–15.0)
MCH: 30.1 pg (ref 26.0–34.0)
MCHC: 33.1 g/dL (ref 30.0–36.0)
MCV: 91 fL (ref 78.0–100.0)
Platelets: 254 10*3/uL (ref 150–400)
RBC: 3.22 MIL/uL — AB (ref 3.87–5.11)
RDW: 17.5 % — ABNORMAL HIGH (ref 11.5–15.5)
WBC: 5.5 10*3/uL (ref 4.0–10.5)

## 2013-11-21 LAB — BASIC METABOLIC PANEL
BUN: 19 mg/dL (ref 6–23)
CO2: 23 meq/L (ref 19–32)
CREATININE: 1.29 mg/dL — AB (ref 0.50–1.10)
Calcium: 8.1 mg/dL — ABNORMAL LOW (ref 8.4–10.5)
Chloride: 108 mEq/L (ref 96–112)
GFR calc Af Amer: 42 mL/min — ABNORMAL LOW (ref >90)
GFR calc non Af Amer: 36 mL/min — ABNORMAL LOW (ref >90)
GLUCOSE: 120 mg/dL — AB (ref 70–99)
Potassium: 3.1 mEq/L — ABNORMAL LOW (ref 3.7–5.3)
Sodium: 144 mEq/L (ref 137–147)

## 2013-11-21 MED ORDER — POTASSIUM CHLORIDE CRYS ER 20 MEQ PO TBCR
40.0000 meq | EXTENDED_RELEASE_TABLET | Freq: Once | ORAL | Status: AC
  Administered 2013-11-21: 40 meq via ORAL
  Filled 2013-11-21: qty 2

## 2013-11-21 MED ORDER — RAMIPRIL 1.25 MG PO CAPS
1.2500 mg | ORAL_CAPSULE | Freq: Every day | ORAL | Status: DC
  Administered 2013-11-21: 1.25 mg via ORAL
  Filled 2013-11-21 (×2): qty 1

## 2013-11-21 MED ORDER — ALBUTEROL SULFATE (2.5 MG/3ML) 0.083% IN NEBU: 2.5000 mg | INHALATION_SOLUTION | RESPIRATORY_TRACT | Status: DC | PRN

## 2013-11-21 NOTE — Evaluation (Signed)
Physical Therapy Evaluation Patient Details Name: Lynn Weaver MRN: [id number] DOB: [birth year] Today's Date: 11/21/2013   History of Present Illness  Pt admit for NSTEMI with hyperkalemia.  CHF as well.    Clinical Impression  Pt admitted with above. Pt currently with functional limitations due to the deficits listed below (see PT Problem List).  Pt will benefit from skilled PT to increase their independence and safety with mobility to allow discharge to the venue listed below.     Follow Up Recommendations SNF;Supervision/Assistance - 24 hour    Equipment Recommendations  Other (comment);Rolling walker with 5" wheels (TBA)    Recommendations for Other Services       Precautions / Restrictions Precautions Precautions: Fall Restrictions Weight Bearing Restrictions: No      Mobility  Bed Mobility Overal bed mobility: Needs Assistance Bed Mobility: Supine to Sit     Supine to sit: Min assist        Transfers Overall transfer level: Needs assistance Equipment used: Rolling walker (2 wheeled) Transfers: Sit to/from Stand Sit to Stand: Min assist         General transfer comment: Needed cues for hand placement.    Ambulation/Gait Ambulation/Gait assistance: Min assist Ambulation Distance (Feet): 85 Feet Assistive device: Rolling walker (2 wheeled) Gait Pattern/deviations: Step-through pattern;Decreased stride length   Gait velocity interpretation: Below normal speed for age/gender General Gait Details: Pt ambulated with need for staying close to RW as she was pushing it too far in front of her.  Pt ambulates slowly with unsteady gait overall.    Stairs            Wheelchair Mobility    Modified Rankin (Stroke Patients Only)       Balance Overall balance assessment: Needs assistance;History of Falls         Standing balance support: Bilateral upper extremity supported;During functional activity Standing balance-Leahy Scale: Poor Standing balance  comment: Needs UE support with RW for stability.              High level balance activites: Direction changes;Turns;Sudden stops High Level Balance Comments: pt needed min asssit for balance.               Pertinent Vitals/Pain VSS, no pain    Home Living Family/patient expects to be discharged to:: Private residence Living Arrangements: Alone Available Help at Discharge: Family;Available PRN/intermittently Type of Home: House Home Access: Stairs to enter Entrance Stairs-Rails: Can reach both;Left;Right Entrance Stairs-Number of Steps: 2 Home Layout: One level Home Equipment: None      Prior Function Level of Independence: Independent         Comments: Pt drives     Hand Dominance   Dominant Hand: Right    Extremity/Trunk Assessment   Upper Extremity Assessment: Defer to OT evaluation           Lower Extremity Assessment: Generalized weakness         Communication   Communication: No difficulties  Cognition Arousal/Alertness: Awake/alert Behavior During Therapy: WFL for tasks assessed/performed Overall Cognitive Status: Within Functional Limits for tasks assessed                      General Comments      Exercises General Exercises - Lower Extremity Ankle Circles/Pumps: AROM;Both;10 reps;Seated Long Arc Quad: AROM;Both;10 reps;Seated      Assessment/Plan    PT Assessment Patient needs continued PT services  PT Diagnosis Generalized weakness   PT Problem List  Decreased activity tolerance;Decreased balance;Decreased mobility;Decreased knowledge of use of DME;Decreased safety awareness;Decreased knowledge of precautions  PT Treatment Interventions DME instruction;Gait training;Functional mobility training;Therapeutic activities;Therapeutic exercise;Balance training;Patient/family education   PT Goals (Current goals can be found in the Care Plan section) Acute Rehab PT Goals Patient Stated Goal: to go home PT Goal Formulation:  With patient Time For Goal Achievement: 11/28/13 Potential to Achieve Goals: Good    Frequency Min 3X/week   Barriers to discharge Decreased caregiver support      Co-evaluation               End of Session Equipment Utilized During Treatment: Gait belt Activity Tolerance: Patient limited by fatigue Patient left: in chair;with call bell/phone within reach Nurse Communication: Mobility status         Time: 1007-1219 PT Time Calculation (min): 22 min   Charges:   PT Evaluation $Initial PT Evaluation Tier I: 1 Procedure PT Treatments $Gait Training: 8-22 mins   PT G Codes:          Ringo Sherod Dorene Ar 2013/12/05, 1:05 PM [facility] Acute Rehabilitation [phone] (pager)

## 2013-11-21 NOTE — Progress Notes (Signed)
Subjective:  Denies any chest pain or shortness of breath states feels much better today. Renal function markedly improved  Objective:  Vital Signs in the last 24 hours: Temp:  [98 F (36.7 C)-100.2 F (37.9 C)] 100.2 F (37.9 C) (05/02 0735) Pulse Rate:  [97-101] 101 (05/01 2106) Resp:  [18-24] 24 (05/02 0336) BP: (111-147)/(48-69) 111/59 mmHg (05/02 0735) SpO2:  [95 %-98 %] 97 % (05/02 0735) Weight:  [70.9 kg (156 lb 4.9 oz)] 70.9 kg (156 lb 4.9 oz) (05/02 0427)  Intake/Output from previous day: 05/01 0701 - 05/02 0700 In: 2790 [P.O.:220; I.V.:2570] Out: 4450 [Urine:4450] Intake/Output from this shift: Total I/O In: 220 [I.V.:220] Out: 400 [Urine:400]  Physical Exam: Neck: no adenopathy, no carotid bruit, no JVD and supple, symmetrical, trachea midline Lungs: clear to auscultation bilaterally Heart: regular rate and rhythm, S1, S2 normal and 2/6 systolic murmur noted Abdomen: soft, non-tender; bowel sounds normal; no masses,  no organomegaly Extremities: extremities normal, atraumatic, no cyanosis or edema  Lab Results:  Recent Labs  11/20/13 0540 11/21/13 0330  WBC 8.8 5.5  HGB 10.5* 9.7*  PLT 269 254    Recent Labs  11/20/13 0540 11/21/13 0330  NA 137 144  K 5.0 3.1*  CL 100 108  CO2 19 23  GLUCOSE 82 120*  BUN 33* 19  CREATININE 1.99* 1.29*   No results found for this basename: TROPONINI, CK, MB,  in the last 72 hours Hepatic Function Panel No results found for this basename: PROT, ALBUMIN, AST, ALT, ALKPHOS, BILITOT, BILIDIR, IBILI,  in the last 72 hours No results found for this basename: CHOL,  in the last 72 hours No results found for this basename: PROTIME,  in the last 72 hours  Imaging: Imaging results have been reviewed and No results found.  Cardiac Studies:  Assessment/Plan:  Compensated systolic heart failure Severe MR/TR questionable ischemic Status post probable small non-Q-wave myocardial infarction Hypertension Anemia etiology  unclear Hypokalemia  Resolving acute renal failure Plan Replace K. Add low-dose ACE inhibitors Monitor renal function closely Check stools for occult blood Check labs in a.m.  LOS: 4 days    Clent Demark 11/21/2013, 10:28 AM

## 2013-11-22 LAB — BASIC METABOLIC PANEL
BUN: 13 mg/dL (ref 6–23)
CALCIUM: 7.9 mg/dL — AB (ref 8.4–10.5)
CO2: 26 meq/L (ref 19–32)
CREATININE: 0.98 mg/dL (ref 0.50–1.10)
Chloride: 107 mEq/L (ref 96–112)
GFR calc non Af Amer: 50 mL/min — ABNORMAL LOW (ref >90)
GFR, EST AFRICAN AMERICAN: 58 mL/min — AB (ref >90)
Glucose, Bld: 96 mg/dL (ref 70–99)
Potassium: 3.5 mEq/L — ABNORMAL LOW (ref 3.7–5.3)
Sodium: 144 mEq/L (ref 137–147)

## 2013-11-22 LAB — CBC
HCT: 30 % — ABNORMAL LOW (ref 36.0–46.0)
Hemoglobin: 9.6 g/dL — ABNORMAL LOW (ref 12.0–15.0)
MCH: 29.7 pg (ref 26.0–34.0)
MCHC: 32 g/dL (ref 30.0–36.0)
MCV: 92.9 fL (ref 78.0–100.0)
PLATELETS: 236 10*3/uL (ref 150–400)
RBC: 3.23 MIL/uL — ABNORMAL LOW (ref 3.87–5.11)
RDW: 18.4 % — AB (ref 11.5–15.5)
WBC: 3.9 10*3/uL — ABNORMAL LOW (ref 4.0–10.5)

## 2013-11-22 LAB — TROPONIN I: TROPONIN I: 0.35 ng/mL — AB (ref <0.30)

## 2013-11-22 LAB — MAGNESIUM: Magnesium: 1.7 mg/dL (ref 1.5–2.5)

## 2013-11-22 MED ORDER — ASPIRIN EC 81 MG PO TBEC
81.0000 mg | DELAYED_RELEASE_TABLET | Freq: Every day | ORAL | Status: DC
  Administered 2013-11-24: 81 mg via ORAL
  Filled 2013-11-22: qty 1

## 2013-11-22 MED ORDER — ASPIRIN 81 MG PO CHEW
81.0000 mg | CHEWABLE_TABLET | ORAL | Status: AC
  Administered 2013-11-23: 81 mg via ORAL
  Filled 2013-11-22: qty 1

## 2013-11-22 MED ORDER — SODIUM CHLORIDE 0.9 % IJ SOLN
3.0000 mL | Freq: Two times a day (BID) | INTRAMUSCULAR | Status: DC
  Administered 2013-11-22 – 2013-11-24 (×4): 3 mL via INTRAVENOUS

## 2013-11-22 MED ORDER — SODIUM CHLORIDE 0.9 % IV SOLN: 250.0000 mL | INTRAVENOUS | Status: DC | PRN

## 2013-11-22 MED ORDER — SODIUM CHLORIDE 0.9 % IJ SOLN: 3.0000 mL | INTRAMUSCULAR | Status: DC | PRN

## 2013-11-22 MED ORDER — RAMIPRIL 2.5 MG PO CAPS
2.5000 mg | ORAL_CAPSULE | Freq: Every day | ORAL | Status: DC
  Administered 2013-11-22 – 2013-11-24 (×3): 2.5 mg via ORAL
  Filled 2013-11-22 (×3): qty 1

## 2013-11-22 MED ORDER — DIAZEPAM 5 MG PO TABS
5.0000 mg | ORAL_TABLET | ORAL | Status: AC
  Administered 2013-11-23: 5 mg via ORAL
  Filled 2013-11-22: qty 1

## 2013-11-22 MED ORDER — SODIUM CHLORIDE 0.9 % IV SOLN
1.0000 mL/kg/h | INTRAVENOUS | Status: DC
  Administered 2013-11-23: 1 mL/kg/h via INTRAVENOUS

## 2013-11-22 NOTE — Progress Notes (Signed)
Subjective:  Patient denies any chest pain or shortness of breath states feels better  Objective:  Vital Signs in the last 24 hours: Temp:  [97.8 F (36.6 C)-99.8 F (37.7 C)] 98.7 F (37.1 C) (05/03 0747) Pulse Rate:  [65-71] 71 (05/03 0747) Resp:  [14-27] 20 (05/03 0747) BP: (97-131)/(46-81) 131/81 mmHg (05/03 0747) SpO2:  [97 %-100 %] 100 % (05/03 0747) Weight:  [71.3 kg (157 lb 3 oz)] 71.3 kg (157 lb 3 oz) (05/03 0300)  Intake/Output from previous day: 05/02 0701 - 05/03 0700 In: 1360 [P.O.:1140; I.V.:220] Out: 1250 [Urine:1250] Intake/Output from this shift:    Physical Exam: Neck: no adenopathy, no carotid bruit, no JVD and supple, symmetrical, trachea midline Lungs: clear to auscultation bilaterally Heart: regular rate and rhythm, S1, S2 normal and 2/6 systolic murmur noted Abdomen: soft, non-tender; bowel sounds normal; no masses,  no organomegaly Extremities: extremities normal, atraumatic, no cyanosis or edema  Lab Results:  Recent Labs  11/21/13 0330 11/22/13 0245  WBC 5.5 3.9*  HGB 9.7* 9.6*  PLT 254 236    Recent Labs  11/21/13 0330 11/22/13 0245  NA 144 144  K 3.1* 3.5*  CL 108 107  CO2 23 26  GLUCOSE 120* 96  BUN 19 13  CREATININE 1.29* 0.98    Recent Labs  11/22/13 0245  TROPONINI 0.35*   Hepatic Function Panel No results found for this basename: PROT, ALBUMIN, AST, ALT, ALKPHOS, BILITOT, BILIDIR, IBILI,  in the last 72 hours No results found for this basename: CHOL,  in the last 72 hours No results found for this basename: PROTIME,  in the last 72 hours  Imaging: Imaging results have been reviewed and No results found.  Cardiac Studies:  Assessment/Plan:  Compensated systolic heart failure  Severe MR/TR questionable ischemic  Status post probable small non-Q-wave myocardial infarction  Hypertension  Anemia etiology unclear  Status post Hypokalemia  Status post acute renal failure Plan Increase ACE inhibitors as per  orders Discussed with patient regarding medical management versus left cardiac cath possible PTCA stenting its risk and benefit she will discuss with the brother before making final decision  LOS: 5 days    Clent Demark 11/22/2013, 9:14 AM

## 2013-11-23 ENCOUNTER — Encounter (HOSPITAL_COMMUNITY)
Admission: EM | Disposition: A | Discharge status: Home / Self Care | Payer: Self-pay | Source: Home / Self Care | Attending: Cardiovascular Disease

## 2013-11-23 HISTORY — PX: LEFT AND RIGHT HEART CATHETERIZATION WITH CORONARY ANGIOGRAM: SHX5449

## 2013-11-23 LAB — CBC
HCT: 32.2 % — ABNORMAL LOW (ref 36.0–46.0)
HEMOGLOBIN: 10.4 g/dL — AB (ref 12.0–15.0)
MCH: 30.3 pg (ref 26.0–34.0)
MCHC: 32.3 g/dL (ref 30.0–36.0)
MCV: 93.9 fL (ref 78.0–100.0)
Platelets: 253 10*3/uL (ref 150–400)
RBC: 3.43 MIL/uL — ABNORMAL LOW (ref 3.87–5.11)
RDW: 18.5 % — ABNORMAL HIGH (ref 11.5–15.5)
WBC: 4.2 10*3/uL (ref 4.0–10.5)

## 2013-11-23 LAB — POCT I-STAT 3, ART BLOOD GAS (G3+)
ACID-BASE DEFICIT: 3 mmol/L — AB (ref 0.0–2.0)
Bicarbonate: 22.6 mEq/L (ref 20.0–24.0)
O2 Saturation: 78 %
TCO2: 24 mmol/L (ref 0–100)
pCO2 arterial: 40.2 mmHg (ref 35.0–45.0)
pH, Arterial: 7.359 (ref 7.350–7.450)
pO2, Arterial: 44 mmHg — ABNORMAL LOW (ref 80.0–100.0)

## 2013-11-23 LAB — POCT I-STAT 3, VENOUS BLOOD GAS (G3P V)
Acid-Base Excess: 1 mmol/L (ref 0.0–2.0)
Bicarbonate: 26.3 mEq/L — ABNORMAL HIGH (ref 20.0–24.0)
O2 Saturation: 57 %
PCO2 VEN: 45.7 mmHg (ref 45.0–50.0)
PH VEN: 7.368 — AB (ref 7.250–7.300)
TCO2: 28 mmol/L (ref 0–100)
pO2, Ven: 31 mmHg (ref 30.0–45.0)

## 2013-11-23 LAB — PROTIME-INR
INR: 1.29 (ref 0.00–1.49)
PROTHROMBIN TIME: 15.8 s — AB (ref 11.6–15.2)

## 2013-11-23 SURGERY — LEFT AND RIGHT HEART CATHETERIZATION WITH CORONARY ANGIOGRAM
Anesthesia: LOCAL

## 2013-11-23 MED ORDER — NITROGLYCERIN 0.2 MG/ML ON CALL CATH LAB
INTRAVENOUS | Status: AC
  Filled 2013-11-23: qty 1

## 2013-11-23 MED ORDER — FENTANYL CITRATE 0.05 MG/ML IJ SOLN
INTRAMUSCULAR | Status: AC
  Filled 2013-11-23: qty 2

## 2013-11-23 MED ORDER — LIDOCAINE HCL (PF) 1 % IJ SOLN
INTRAMUSCULAR | Status: AC
  Filled 2013-11-23: qty 30

## 2013-11-23 MED ORDER — FUROSEMIDE 10 MG/ML IJ SOLN
40.0000 mg | Freq: Once | INTRAMUSCULAR | Status: AC
  Administered 2013-11-23: 40 mg via INTRAVENOUS
  Filled 2013-11-23: qty 4

## 2013-11-23 MED ORDER — MIDAZOLAM HCL 2 MG/2ML IJ SOLN
INTRAMUSCULAR | Status: AC
  Filled 2013-11-23: qty 2

## 2013-11-23 MED ORDER — HEPARIN (PORCINE) IN NACL 2-0.9 UNIT/ML-% IJ SOLN
INTRAMUSCULAR | Status: AC
  Filled 2013-11-23: qty 1000

## 2013-11-23 MED ORDER — SODIUM CHLORIDE 0.9 % IV SOLN: INTRAVENOUS | Status: DC

## 2013-11-23 NOTE — H&P (View-Only) (Signed)
Ref: Leonides Grills, MD   Subjective:  Less confused today and sitting up, asking if she could go home.  Afebrile. Improved urine output and renal function  Objective:  Vital Signs in the last 24 hours: Temp:  [98.3 F (36.8 C)-98.8 F (37.1 C)] 98.7 F (37.1 C) (05/01 0747) Pulse Rate:  [66-89] 66 (05/01 0747) Cardiac Rhythm:  [-] Normal sinus rhythm (05/01 0750) Resp:  [13-25] 17 (05/01 0747) BP: (108-148)/(49-108) 109/77 mmHg (05/01 0747) SpO2:  [91 %-98 %] 97 % (05/01 0750) Weight:  [72.8 kg (160 lb 7.9 oz)] 72.8 kg (160 lb 7.9 oz) (05/01 0500)  Physical Exam: BP Readings from Last 1 Encounters:  11/20/13 109/77    Wt Readings from Last 1 Encounters:  11/20/13 72.8 kg (160 lb 7.9 oz)    Weight change: 2.7 kg (5 lb 15.2 oz)  HEENT: [place]/AT, Eyes-Brown, PERL, EOMI, Conjunctiva-Pale pink, Sclera-Non-icteric Neck: + JVD, No bruit, Trachea midline. Lungs:  Clear, Bilateral. Cardiac:  Regular rhythm, normal S1 and S2, no S3. II/VI systolic murmur. Abdomen:  Soft, non-tender. Extremities:  1 + edema present. No cyanosis. No clubbing. CNS: AxOx3, Cranial nerves grossly intact, moves all 4 extremities. Right handed. Skin: Warm and dry.   Intake/Output from previous day: 04/30 0701 - 05/01 0700 In: 1592.1 [P.O.:540; I.V.:53.8; IV Piggyback:998.3] Out: 500 [Urine:500]    Lab Results: BMET    Component Value Date/Time   NA 137 11/20/2013 0540   NA 140 11/19/2013 0315   NA 140 11/18/2013 0755   K 5.0 11/20/2013 0540   K 5.9* 11/19/2013 0315   K 5.3 11/18/2013 0755   CL 100 11/20/2013 0540   CL 99 11/19/2013 0315   CL 100 11/18/2013 0755   CO2 19 11/20/2013 0540   CO2 13* 11/19/2013 0315   CO2 19 11/18/2013 0755   GLUCOSE 82 11/20/2013 0540   GLUCOSE 74 11/19/2013 0315   GLUCOSE 127* 11/18/2013 0755   BUN 33* 11/20/2013 0540   BUN 22 11/19/2013 0315   BUN 14 11/18/2013 0755   CREATININE 1.99* 11/20/2013 0540   CREATININE 2.12* 11/19/2013 0315   CREATININE 1.44* 11/18/2013 0755   CALCIUM 8.0* 11/20/2013 0540   CALCIUM 9.2 11/19/2013 0315   CALCIUM 8.9 11/18/2013 0755   GFRNONAA 21* 11/20/2013 0540   GFRNONAA 20* 11/19/2013 0315   GFRNONAA 32* 11/18/2013 0755   GFRAA 25* 11/20/2013 0540   GFRAA 23* 11/19/2013 0315   GFRAA 37* 11/18/2013 0755   CBC    Component Value Date/Time   WBC 8.8 11/20/2013 0540   RBC 3.49* 11/20/2013 0540   HGB 10.5* 11/20/2013 0540   HCT 31.7* 11/20/2013 0540   PLT 269 11/20/2013 0540   MCV 90.8 11/20/2013 0540   MCH 30.1 11/20/2013 0540   MCHC 33.1 11/20/2013 0540   RDW 17.8* 11/20/2013 0540   LYMPHSABS 0.7 11/17/2013 0953   MONOABS 0.2 11/17/2013 0953   EOSABS 0.0 11/17/2013 0953   BASOSABS 0.0 11/17/2013 0953   HEPATIC Function Panel  Recent Labs  06/23/13 0914 06/24/13 0637 11/17/13 0953  PROT 6.6 5.7* 6.7   HEMOGLOBIN A1C No components found with this basename: HGA1C,  MPG   CARDIAC ENZYMES Lab Results  Component Value Date   TROPONINI 0.63* 11/18/2013   TROPONINI 0.52* 11/17/2013   TROPONINI 0.45* 11/17/2013   BNP  Recent Labs  11/17/13 1203  PROBNP 27715.0*   TSH  Recent Labs  11/17/13 1755  TSH 3.000   CHOLESTEROL No results found for this basename: CHOL,  in the last 8760 hours  Scheduled Meds: . albuterol  2.5 mg Nebulization TID  . aspirin EC  81 mg Oral Daily  . digoxin  0.25 mg Oral Daily  . ferrous sulfate  325 mg Oral Q breakfast  . heparin  5,000 Units Subcutaneous 3 times per day  . metoprolol tartrate  12.5 mg Oral BID  . pantoprazole  40 mg Oral QAC breakfast  . pneumococcal 23 valent vaccine  0.5 mL Intramuscular Tomorrow-1000  . sodium chloride  3 mL Intravenous Q12H   Continuous Infusions: . dextrose 5 % and 0.45% NaCl 100 mL/hr at 11/20/13 1000   PRN Meds:.sodium chloride, acetaminophen, loperamide, ondansetron (ZOFRAN) IV, sodium chloride  Assessment/Plan: Acute biventricular systolic failure.  Hypertension  Mild anemia  Acute renal failure -improving Hyperkalemia -resolved   Try additional fluid  bolus to improve biventricular filling and renal function.   LOS: 3 days    Dixie Dials  MD  11/20/2013, 11:38 AM

## 2013-11-23 NOTE — Significant Event (Signed)
Patient taken to Cath Lab, with primary RN and transport. VS stable during the transfer. Will await for patient's return. Naeema Patlan, Therapist, sports.

## 2013-11-23 NOTE — CV Procedure (Signed)
PROCEDURE:  Right and Left heart catheterization with selective coronary angiography, left ventriculogram.  CLINICAL HISTORY:  This is a 80 year old female with NSTEMI has Bi-ventricular failure.  The risks, benefits, and details of the procedure were explained to the patient.  The patient verbalized understanding and wanted to proceed.  Informed written consent was obtained.  PROCEDURE TECHNIQUE:  The patient was approached from the right femoral vein using 7 French short sheath and right femoral artery using a 5 French short sheath. Right heart pressures, cardiac output and oxygen saturation were measured using a Swan Ganz catheter placed through right femoral vein. Left coronary angiography was done using a Judkins L4 guide catheter.  Right coronary angiography was done using a Judkins R4 guide catheter. Left ventriculography was done using a pigtail catheter.    CONTRAST:  Total of 30 cc.  COMPLICATIONS:  None.  At the end of the procedure a manual pressure was used for hemostasis.    HEMODYNAMICS:  Pulmonary pressure was 58/20: Wedge pressure was : RV pressure was 20: RA pressure was 17.   Oxygen saturation of blood sample from PA was 57 and AO was 98.  Aortic pressure was 139/67; LV pressure was 132/11; LVEDP 17.  There was no gradient between the left ventricle and aorta.    Cardiac output was 4.01 L/min by Fick method and 2.12 L/Min by Thermodilution.  ANGIOGRAM/CORONARY ARTERIOGRAM:   The left main coronary artery is short and unremarkable.  The left anterior descending artery has luminal irregularities in proximal half of the vessel with slow filling. Diagonal 1, 2, 3 and 4 are unremarkable.  The left circumflex artery is small and has OM branch which is unremarkable.  The right coronary artery is dominant and has mild calcification and luminal irregularities. Posterolateral and PDA are unremarkable.  LEFT VENTRICULOGRAM:  Left ventricular angiogram was not done to conserve dye  use. Echocardiogram revealed dilated LV with severe decrease in LV systolic function with an estimated ejection fraction of 25%.  LVEDP was 17 mmHg.  IMPRESSION OF HEART CATHETERIZATION:   1. Normal left main coronary artery. 2. Mild disease of left anterior descending artery and its branches. 3. Normal left circumflex artery and its branches. 4. Mild disease of dominant right coronary artery. 5. Normal left ventricular systolic function.  LVEDP 17 mmHg.  Ej. fraction  25 %. 6. Elevated PA and wedge pressures.  RECOMMENDATION:   Medical therapy with diuretic, digoxin as tolerated.

## 2013-11-23 NOTE — Interval H&P Note (Signed)
History and Physical Interval Note:  11/23/2013 12:52 PM  Lynn Weaver  has presented today for surgery, with the diagnosis of cp  The various methods of treatment have been discussed with the patient and family. After consideration of risks, benefits and other options for treatment, the patient has consented to  Procedure(s): LEFT AND RIGHT HEART CATHETERIZATION WITH CORONARY ANGIOGRAM (N/A) as a surgical intervention .  The patient's history has been reviewed, patient examined, no change in status, stable for surgery.  I have reviewed the patient's chart and labs.  Questions were answered to the patient's satisfaction.     Birdie Riddle

## 2013-11-24 LAB — CBC
HCT: 33.6 % — ABNORMAL LOW (ref 36.0–46.0)
Hemoglobin: 10.9 g/dL — ABNORMAL LOW (ref 12.0–15.0)
MCH: 30.5 pg (ref 26.0–34.0)
MCHC: 32.4 g/dL (ref 30.0–36.0)
MCV: 94.1 fL (ref 78.0–100.0)
Platelets: 290 10*3/uL (ref 150–400)
RBC: 3.57 MIL/uL — ABNORMAL LOW (ref 3.87–5.11)
RDW: 18.5 % — ABNORMAL HIGH (ref 11.5–15.5)
WBC: 4.5 10*3/uL (ref 4.0–10.5)

## 2013-11-24 LAB — BASIC METABOLIC PANEL
BUN: 15 mg/dL (ref 6–23)
CO2: 24 mEq/L (ref 19–32)
CREATININE: 0.94 mg/dL (ref 0.50–1.10)
Calcium: 8.6 mg/dL (ref 8.4–10.5)
Chloride: 100 mEq/L (ref 96–112)
GFR, EST AFRICAN AMERICAN: 61 mL/min — AB (ref >90)
GFR, EST NON AFRICAN AMERICAN: 53 mL/min — AB (ref >90)
Glucose, Bld: 88 mg/dL (ref 70–99)
POTASSIUM: 4.9 meq/L (ref 3.7–5.3)
Sodium: 138 mEq/L (ref 137–147)

## 2013-11-24 MED ORDER — METOPROLOL TARTRATE 12.5 MG HALF TABLET: 12.5000 mg | ORAL_TABLET | Freq: Two times a day (BID) | ORAL | Status: AC

## 2013-11-24 MED ORDER — ASPIRIN 81 MG PO TBEC: 81.0000 mg | DELAYED_RELEASE_TABLET | Freq: Every day | ORAL | Status: DC

## 2013-11-24 MED ORDER — FUROSEMIDE 20 MG PO TABS: 20.0000 mg | ORAL_TABLET | Freq: Every day | ORAL | Status: DC

## 2013-11-24 MED ORDER — DIGOXIN 125 MCG PO TABS: 0.1250 mg | ORAL_TABLET | Freq: Every day | ORAL | Status: DC

## 2013-11-24 NOTE — Care Management Note (Addendum)
    Page 1 of 2   11/24/2013     11:50:29 AM CARE MANAGEMENT NOTE 11/24/2013  Patient:  Lynn Weaver, Lynn Weaver   Account Number:  [id number]  Date Initiated:  11/17/2013  Documentation initiated by:  Elissa Hefty  Subjective/Objective Assessment:   adm w mi     Action/Plan:   lives w fam, pcp dr Peyton Bottoms [place]   Anticipated DC Date:  11/24/2013   Anticipated DC Plan:  [place]  CM consult      [place]   Choice offered to / List presented to:  C-1 Patient   DME arranged  [place]      DME agency  [place] arranged  HH-1 RN  [place]      [place].   Status of service:  Completed, signed off Medicare Important Message given?  YES (If response is "NO", the following Medicare IM given date fields will be blank) Date Medicare IM given:  11/24/2013 Date Additional Medicare IM given:    Discharge Disposition:    Per UR Regulation:  Reviewed for med. necessity/level of care/duration of stay  If discussed at [place] of Stay Meetings, dates discussed:   11/24/2013    Comments:  5/5 1012 a debbie Taylormarie Register rn,bsn pt refuses snf. she states lives alone but has brother and other relatives close that will ck on her. enc her to get some of her fam to stay until she is stronger. went over hhc list but no pref. ref to [facility] for hhrn-hhpt-hhsw in case changes mind and wants snf.

## 2013-11-24 NOTE — Progress Notes (Signed)
PT recommending SNF; pt states she wishes to go home with home health. MD ordered PT, RN, and Social work to follow up in the home with pt. Home health social worker will be able to follow up and assist with transition to SNF if pt would like this option. CSW signing off.   Ky Barban, MSW, [facility] Clinical Social Worker [phone]

## 2013-11-24 NOTE — Discharge Instructions (Signed)
[facility] [phone] hhrn, hhpt,hhsw

## 2013-11-24 NOTE — Discharge Summary (Signed)
Physician Discharge Summary  Patient ID: Lynn Weaver MRN: [id number] DOB/AGE: 08/17/1924 80 y.o.  Admit date: 11/17/2013 Discharge date: 11/24/2013  Admission Diagnoses: Acute biventricular systolic failure.  Hypertension  Mild anemia    Discharge Diagnoses:  Principle Problem: * Non-STEMI (non-ST elevated myocardial infarction) * Acute biventricular systolic failure.  Hypertension  Mild anemia of chronic disease  Acute renal failure -resolved Hyperkalemia -resolved Dilated non-ischemic cardiomyopathy Moderate Pulmonary systolic hypertension Moderate MR Severe TR Senile dementia  Discharged Condition: fair  Hospital Course: 80 year old female presented to area ED with complaint of belly pain and diarrhea but additional work up showed hypokalemia and elevated BNP with mild bilateral pleural effusions and cardiomegaly. No chest pain per patient. Cardiac enzymes are slightly high consistent with CHF/cardiomyopathy and NSTEMI. She improved clinically with IV dopamine for 24 hours and lanoxin use and required IV fluids to resolve acute renal failure induced by lasix use. Her cardiac catheterization showed non-critical multivessel coronary artery disease. She had poor cardiac output but has limited activity and ambulates with a walker without chest pain or significant shortness of breath. She was discharged home with home heath RN for CHF monitoring. She will be seen by me in 1 week and by primary care doctor, Elsie Lincoln, MD in 1 week.   Consults: cardiology  Significant Diagnostic Studies: labs: Normal CBC except low Hgb of 10.9 and normal iron level, TSH. Troponin-I mildly elevated at 0.45 ng/ml. Negative fecal occult blood test and Clostridium Difficile by PCR.  EKG: SR, RBBB and infero-lateral ischemia.  Chest x-ray :There is vascular congestion and mild interstitial edema. Small bilateral pleural effusions are noted.  Echocardiogram:Left ventricle: The cavity size was mildly  dilated. Systolic function was severely reduced. The estimated ejection fraction was in the range of 20% to 25%. Wall motion was normal; there were no regional wall motion abnormalities. Doppler parameters are consistent with abnormal left ventricular relaxation (grade 1 diastolic dysfunction). - Mitral valve: Calcified annulus. Severe regurgitation. - Left atrium: The atrium was severely dilated. - Right ventricle: The cavity size was moderately dilated. Wall thickness was normal. Systolic function was severely reduced. - Right atrium: The atrium was severely dilated. - Tricuspid valve: Severe regurgitation. - Pulmonary arteries: Systolic pressure was moderately increased. PA peak pressure: 6mm Hg (S).  Right + Left heart cath: 1.   Normal left main coronary artery. 2.   Mild disease of left anterior descending artery and its branches. 3.   Normal left circumflex artery and its branches. 4.   Mild disease of dominant right coronary artery. 5.   Normal left ventricular systolic function. LVEDP 17 mmHg. Ej. fraction 25 %. 6.   Elevated PA and wedge pressures.   Treatments: IV hydration, cardiac meds: lisinopril (Zestril), metoprolol, digoxin and furosemide and procedures: Right and left heart catheterization.  Discharge Exam: Blood pressure 153/66, pulse 72, temperature 98.4 F (36.9 C), temperature source Oral, resp. rate 20, height 5\' 6"  (1.676 m), weight 68.6 kg (151 lb 3.8 oz), SpO2 98.00%. HEENT: [place]/AT, Eyes-Brown, PERL, EOMI, Conjunctiva-Pale pink, Sclera-Non-icteric  Neck: No JVD, No bruit, Trachea midline.  Lungs: Clear, Bilateral.  Cardiac: Regular rhythm, normal S1 and S2, no S3. II/VI systolic murmur.  Abdomen: Soft, non-tender.  Extremities: Trace ankle edema present. No cyanosis. No clubbing.  CNS: AxOx3, Cranial nerves grossly intact, moves all 4 extremities. Right handed.  Skin: Warm and dry.   Disposition: 01-Home or Self Care with home health RN     Medication  List  acetaminophen 325 MG tablet  Commonly known as:  TYLENOL  Take 325 mg by mouth 2 (two) times daily as needed (for pain). For pain     aspirin 81 MG EC tablet  Take 1 tablet (81 mg total) by mouth daily.     digoxin 0.125 MG tablet  Commonly known as:  LANOXIN  Take 1 tablet (0.125 mg total) by mouth daily.     ferrous sulfate 325 (65 FE) MG tablet  Take 325 mg by mouth daily with breakfast.     furosemide 20 MG tablet  Commonly known as:  LASIX  Take 1 tablet (20 mg total) by mouth daily.     lisinopril 10 MG tablet  Commonly known as:  PRINIVIL,ZESTRIL  Take 10 mg by mouth daily.     metoprolol tartrate 12.5 mg Tabs tablet  Commonly known as:  LOPRESSOR  Take 0.5 tablets (12.5 mg total) by mouth 2 (two) times daily.     pantoprazole 40 MG tablet  Commonly known as:  PROTONIX  Take 1 tablet (40 mg total) by mouth daily before breakfast.     potassium chloride 10 MEQ tablet  Commonly known as:  K-DUR  Take 10 mEq by mouth 3 (three) times a week.           Follow-up Information   Follow up with Leonides Grills, MD. Schedule an appointment as soon as possible for a visit in 1 week.   Specialty:  Family Medicine   Contact information:   [address] [place] [address] [phone]       Follow up with [facility], MD. Schedule an appointment as soon as possible for a visit in 1 week.   Specialty:  Cardiology   Contact information:   [place] [address] [phone]       Signed: Birdie Riddle 11/24/2013, 9:25 AM

## 2014-07-01 ENCOUNTER — Encounter (HOSPITAL_COMMUNITY): Payer: Self-pay | Admitting: Cardiovascular Disease

## 2015-02-04 ENCOUNTER — Other Ambulatory Visit (HOSPITAL_COMMUNITY): Payer: Self-pay | Admitting: Internal Medicine

## 2015-02-04 ENCOUNTER — Ambulatory Visit (HOSPITAL_COMMUNITY)
Admission: RE | Admit: 2015-02-04 | Discharge: 2015-02-04 | Disposition: A | Discharge status: Home / Self Care | Payer: Medicare PPO | Source: Ambulatory Visit | Attending: Internal Medicine | Admitting: Internal Medicine

## 2015-02-04 DIAGNOSIS — R05 Cough: Secondary | ICD-10-CM

## 2015-02-04 DIAGNOSIS — J019 Acute sinusitis, unspecified: Secondary | ICD-10-CM | POA: Diagnosis not present

## 2015-02-04 DIAGNOSIS — R059 Cough, unspecified: Secondary | ICD-10-CM

## 2015-02-04 DIAGNOSIS — I10 Essential (primary) hypertension: Secondary | ICD-10-CM | POA: Diagnosis not present

## 2015-02-04 DIAGNOSIS — J209 Acute bronchitis, unspecified: Secondary | ICD-10-CM | POA: Diagnosis not present

## 2015-02-04 DIAGNOSIS — Z6822 Body mass index (BMI) 22.0-22.9, adult: Secondary | ICD-10-CM | POA: Diagnosis not present

## 2015-02-04 DIAGNOSIS — J90 Pleural effusion, not elsewhere classified: Secondary | ICD-10-CM | POA: Diagnosis not present

## 2015-02-04 DIAGNOSIS — M1991 Primary osteoarthritis, unspecified site: Secondary | ICD-10-CM | POA: Diagnosis not present

## 2015-02-04 DIAGNOSIS — F0390 Unspecified dementia without behavioral disturbance: Secondary | ICD-10-CM | POA: Diagnosis not present

## 2015-02-04 DIAGNOSIS — Z1389 Encounter for screening for other disorder: Secondary | ICD-10-CM | POA: Diagnosis not present

## 2015-02-04 DIAGNOSIS — J9801 Acute bronchospasm: Secondary | ICD-10-CM | POA: Diagnosis not present

## 2015-02-04 DIAGNOSIS — M8580 Other specified disorders of bone density and structure, unspecified site: Secondary | ICD-10-CM

## 2015-02-10 ENCOUNTER — Ambulatory Visit (HOSPITAL_COMMUNITY)
Admission: RE | Admit: 2015-02-10 | Discharge: 2015-02-10 | Disposition: A | Discharge status: Home / Self Care | Payer: Medicare PPO | Source: Ambulatory Visit | Attending: Internal Medicine | Admitting: Internal Medicine

## 2015-02-10 DIAGNOSIS — Z780 Asymptomatic menopausal state: Secondary | ICD-10-CM | POA: Diagnosis not present

## 2015-02-10 DIAGNOSIS — M8580 Other specified disorders of bone density and structure, unspecified site: Secondary | ICD-10-CM | POA: Diagnosis not present

## 2015-02-10 DIAGNOSIS — M810 Age-related osteoporosis without current pathological fracture: Secondary | ICD-10-CM | POA: Diagnosis not present

## 2015-02-20 NOTE — Progress Notes (Signed)
REVIEWED-NO ADDITIONAL RECOMMENDATIONS. 

## 2015-04-14 DIAGNOSIS — Z79899 Other long term (current) drug therapy: Secondary | ICD-10-CM | POA: Diagnosis not present

## 2015-04-14 DIAGNOSIS — E559 Vitamin D deficiency, unspecified: Secondary | ICD-10-CM | POA: Diagnosis not present

## 2015-04-14 DIAGNOSIS — I10 Essential (primary) hypertension: Secondary | ICD-10-CM | POA: Diagnosis not present

## 2015-04-14 DIAGNOSIS — E876 Hypokalemia: Secondary | ICD-10-CM | POA: Diagnosis not present

## 2015-04-14 DIAGNOSIS — I2510 Atherosclerotic heart disease of native coronary artery without angina pectoris: Secondary | ICD-10-CM | POA: Diagnosis not present

## 2015-04-14 DIAGNOSIS — D638 Anemia in other chronic diseases classified elsewhere: Secondary | ICD-10-CM | POA: Diagnosis not present

## 2015-04-14 DIAGNOSIS — E785 Hyperlipidemia, unspecified: Secondary | ICD-10-CM | POA: Diagnosis not present

## 2015-04-14 DIAGNOSIS — I5022 Chronic systolic (congestive) heart failure: Secondary | ICD-10-CM | POA: Diagnosis not present

## 2015-04-14 DIAGNOSIS — D649 Anemia, unspecified: Secondary | ICD-10-CM | POA: Diagnosis not present

## 2015-05-09 DIAGNOSIS — I5022 Chronic systolic (congestive) heart failure: Secondary | ICD-10-CM | POA: Diagnosis not present

## 2015-05-09 DIAGNOSIS — D638 Anemia in other chronic diseases classified elsewhere: Secondary | ICD-10-CM | POA: Diagnosis not present

## 2015-05-09 DIAGNOSIS — I2510 Atherosclerotic heart disease of native coronary artery without angina pectoris: Secondary | ICD-10-CM | POA: Diagnosis not present

## 2015-05-09 DIAGNOSIS — I10 Essential (primary) hypertension: Secondary | ICD-10-CM | POA: Diagnosis not present

## 2015-06-22 DIAGNOSIS — I2510 Atherosclerotic heart disease of native coronary artery without angina pectoris: Secondary | ICD-10-CM | POA: Diagnosis not present

## 2015-06-22 DIAGNOSIS — I10 Essential (primary) hypertension: Secondary | ICD-10-CM | POA: Diagnosis not present

## 2015-06-22 DIAGNOSIS — I5022 Chronic systolic (congestive) heart failure: Secondary | ICD-10-CM | POA: Diagnosis not present

## 2015-09-28 DIAGNOSIS — I5022 Chronic systolic (congestive) heart failure: Secondary | ICD-10-CM | POA: Diagnosis not present

## 2015-09-28 DIAGNOSIS — J4520 Mild intermittent asthma, uncomplicated: Secondary | ICD-10-CM | POA: Diagnosis not present

## 2015-09-28 DIAGNOSIS — I10 Essential (primary) hypertension: Secondary | ICD-10-CM | POA: Diagnosis not present

## 2015-09-28 DIAGNOSIS — E559 Vitamin D deficiency, unspecified: Secondary | ICD-10-CM | POA: Diagnosis not present

## 2015-09-28 DIAGNOSIS — I2510 Atherosclerotic heart disease of native coronary artery without angina pectoris: Secondary | ICD-10-CM | POA: Diagnosis not present

## 2015-11-17 DIAGNOSIS — I10 Essential (primary) hypertension: Secondary | ICD-10-CM | POA: Diagnosis not present

## 2015-11-17 DIAGNOSIS — Z1389 Encounter for screening for other disorder: Secondary | ICD-10-CM | POA: Diagnosis not present

## 2015-11-17 DIAGNOSIS — Z0000 Encounter for general adult medical examination without abnormal findings: Secondary | ICD-10-CM | POA: Diagnosis not present

## 2015-11-17 DIAGNOSIS — Z6823 Body mass index (BMI) 23.0-23.9, adult: Secondary | ICD-10-CM | POA: Diagnosis not present

## 2015-12-07 DIAGNOSIS — E559 Vitamin D deficiency, unspecified: Secondary | ICD-10-CM | POA: Diagnosis not present

## 2015-12-07 DIAGNOSIS — I2510 Atherosclerotic heart disease of native coronary artery without angina pectoris: Secondary | ICD-10-CM | POA: Diagnosis not present

## 2015-12-07 DIAGNOSIS — J4520 Mild intermittent asthma, uncomplicated: Secondary | ICD-10-CM | POA: Diagnosis not present

## 2015-12-07 DIAGNOSIS — E876 Hypokalemia: Secondary | ICD-10-CM | POA: Diagnosis not present

## 2015-12-07 DIAGNOSIS — I10 Essential (primary) hypertension: Secondary | ICD-10-CM | POA: Diagnosis not present

## 2015-12-07 DIAGNOSIS — R072 Precordial pain: Secondary | ICD-10-CM | POA: Diagnosis not present

## 2015-12-07 DIAGNOSIS — Z79899 Other long term (current) drug therapy: Secondary | ICD-10-CM | POA: Diagnosis not present

## 2015-12-07 DIAGNOSIS — E785 Hyperlipidemia, unspecified: Secondary | ICD-10-CM | POA: Diagnosis not present

## 2015-12-07 DIAGNOSIS — I452 Bifascicular block: Secondary | ICD-10-CM | POA: Diagnosis not present

## 2015-12-07 DIAGNOSIS — D649 Anemia, unspecified: Secondary | ICD-10-CM | POA: Diagnosis not present

## 2015-12-07 DIAGNOSIS — I5022 Chronic systolic (congestive) heart failure: Secondary | ICD-10-CM | POA: Diagnosis not present

## 2016-01-18 ENCOUNTER — Ambulatory Visit (HOSPITAL_COMMUNITY)
Admission: RE | Admit: 2016-01-18 | Discharge: 2016-01-18 | Disposition: A | Discharge status: Home / Self Care | Payer: Medicare PPO | Source: Ambulatory Visit | Attending: Internal Medicine | Admitting: Internal Medicine

## 2016-01-18 ENCOUNTER — Other Ambulatory Visit (HOSPITAL_COMMUNITY): Payer: Self-pay | Admitting: Internal Medicine

## 2016-01-18 DIAGNOSIS — I517 Cardiomegaly: Secondary | ICD-10-CM | POA: Diagnosis not present

## 2016-01-18 DIAGNOSIS — I10 Essential (primary) hypertension: Secondary | ICD-10-CM | POA: Diagnosis not present

## 2016-01-18 DIAGNOSIS — R0600 Dyspnea, unspecified: Secondary | ICD-10-CM | POA: Diagnosis not present

## 2016-01-18 DIAGNOSIS — R0601 Orthopnea: Secondary | ICD-10-CM | POA: Diagnosis not present

## 2016-01-18 DIAGNOSIS — R1310 Dysphagia, unspecified: Secondary | ICD-10-CM | POA: Diagnosis not present

## 2016-01-18 DIAGNOSIS — Z6823 Body mass index (BMI) 23.0-23.9, adult: Secondary | ICD-10-CM | POA: Diagnosis not present

## 2016-01-18 DIAGNOSIS — R0603 Acute respiratory distress: Secondary | ICD-10-CM

## 2016-01-18 DIAGNOSIS — R05 Cough: Secondary | ICD-10-CM | POA: Diagnosis not present

## 2016-01-18 DIAGNOSIS — J45901 Unspecified asthma with (acute) exacerbation: Secondary | ICD-10-CM | POA: Diagnosis not present

## 2016-01-25 ENCOUNTER — Other Ambulatory Visit (HOSPITAL_COMMUNITY): Payer: Self-pay | Admitting: Internal Medicine

## 2016-01-26 ENCOUNTER — Other Ambulatory Visit (HOSPITAL_COMMUNITY): Payer: Self-pay | Admitting: Internal Medicine

## 2016-01-26 DIAGNOSIS — R1310 Dysphagia, unspecified: Secondary | ICD-10-CM

## 2016-01-26 DIAGNOSIS — Z6823 Body mass index (BMI) 23.0-23.9, adult: Secondary | ICD-10-CM | POA: Diagnosis not present

## 2016-01-26 DIAGNOSIS — I10 Essential (primary) hypertension: Secondary | ICD-10-CM | POA: Diagnosis not present

## 2016-01-31 ENCOUNTER — Ambulatory Visit (HOSPITAL_COMMUNITY)
Admission: RE | Admit: 2016-01-31 | Discharge: 2016-01-31 | Disposition: A | Discharge status: Home / Self Care | Payer: Medicare PPO | Source: Ambulatory Visit | Attending: Internal Medicine | Admitting: Internal Medicine

## 2016-01-31 DIAGNOSIS — R1310 Dysphagia, unspecified: Secondary | ICD-10-CM | POA: Diagnosis not present

## 2016-01-31 DIAGNOSIS — K222 Esophageal obstruction: Secondary | ICD-10-CM | POA: Diagnosis not present

## 2016-01-31 DIAGNOSIS — K225 Diverticulum of esophagus, acquired: Secondary | ICD-10-CM | POA: Diagnosis not present

## 2016-03-08 DIAGNOSIS — I5022 Chronic systolic (congestive) heart failure: Secondary | ICD-10-CM | POA: Diagnosis not present

## 2016-03-08 DIAGNOSIS — J4520 Mild intermittent asthma, uncomplicated: Secondary | ICD-10-CM | POA: Diagnosis not present

## 2016-03-08 DIAGNOSIS — I2510 Atherosclerotic heart disease of native coronary artery without angina pectoris: Secondary | ICD-10-CM | POA: Diagnosis not present

## 2016-03-08 DIAGNOSIS — I10 Essential (primary) hypertension: Secondary | ICD-10-CM | POA: Diagnosis not present

## 2016-03-08 DIAGNOSIS — E559 Vitamin D deficiency, unspecified: Secondary | ICD-10-CM | POA: Diagnosis not present

## 2016-04-25 DIAGNOSIS — I340 Nonrheumatic mitral (valve) insufficiency: Secondary | ICD-10-CM | POA: Diagnosis not present

## 2016-04-25 DIAGNOSIS — I361 Nonrheumatic tricuspid (valve) insufficiency: Secondary | ICD-10-CM | POA: Diagnosis not present

## 2016-04-25 DIAGNOSIS — I351 Nonrheumatic aortic (valve) insufficiency: Secondary | ICD-10-CM | POA: Diagnosis not present

## 2016-04-25 DIAGNOSIS — I371 Nonrheumatic pulmonary valve insufficiency: Secondary | ICD-10-CM | POA: Diagnosis not present

## 2016-05-28 ENCOUNTER — Encounter: Payer: Self-pay | Admitting: Gastroenterology

## 2016-07-04 DIAGNOSIS — E559 Vitamin D deficiency, unspecified: Secondary | ICD-10-CM | POA: Diagnosis not present

## 2016-07-04 DIAGNOSIS — J4520 Mild intermittent asthma, uncomplicated: Secondary | ICD-10-CM | POA: Diagnosis not present

## 2016-07-04 DIAGNOSIS — I2510 Atherosclerotic heart disease of native coronary artery without angina pectoris: Secondary | ICD-10-CM | POA: Diagnosis not present

## 2016-07-04 DIAGNOSIS — I5022 Chronic systolic (congestive) heart failure: Secondary | ICD-10-CM | POA: Diagnosis not present

## 2016-07-04 DIAGNOSIS — I10 Essential (primary) hypertension: Secondary | ICD-10-CM | POA: Diagnosis not present

## 2016-08-15 ENCOUNTER — Encounter: Payer: Self-pay | Admitting: Gastroenterology

## 2016-08-15 DIAGNOSIS — Z6821 Body mass index (BMI) 21.0-21.9, adult: Secondary | ICD-10-CM | POA: Diagnosis not present

## 2016-08-15 DIAGNOSIS — K225 Diverticulum of esophagus, acquired: Secondary | ICD-10-CM | POA: Diagnosis not present

## 2016-08-15 DIAGNOSIS — Z1389 Encounter for screening for other disorder: Secondary | ICD-10-CM | POA: Diagnosis not present

## 2016-08-15 DIAGNOSIS — M1991 Primary osteoarthritis, unspecified site: Secondary | ICD-10-CM | POA: Diagnosis not present

## 2016-09-12 ENCOUNTER — Encounter: Payer: Self-pay | Admitting: Gastroenterology

## 2016-09-12 ENCOUNTER — Ambulatory Visit (INDEPENDENT_AMBULATORY_CARE_PROVIDER_SITE_OTHER): Payer: Medicare PPO | Admitting: Gastroenterology

## 2016-09-12 DIAGNOSIS — K625 Hemorrhage of anus and rectum: Secondary | ICD-10-CM | POA: Diagnosis not present

## 2016-09-12 DIAGNOSIS — R1310 Dysphagia, unspecified: Secondary | ICD-10-CM | POA: Insufficient documentation

## 2016-09-12 NOTE — Assessment & Plan Note (Signed)
SYMPTOMS NOT CONTROLLED DUE TO A ZENKER'S DIVERTICULUM AND EXACERBATED BY ESOPHAGEAL WEB.  FOLLOW A SOFT MECHANICAL DIET.  MEATS SHOULD BE CHOPPED,PULLED, BOILED, OR GROUND ONLY.  HANDOUT GIVEN. COMPLETE UPPER ENDOSCOPY TO STRETCH  ESOPHAGUS IN 2-3 WEEKS. DISCUSSED PROCEDURE, BENEFITS, RISKS, AND MANAGEMENT OF DYSPHAGIA. PT NOT INTERESTED IN SURGICAL REPAIR OF ZENKER'S. AGREES TO SCHEDULE EGD/DIL. NIECE PRESENT FOR DISCUSSION. FOLLOW UP IN 4 MOS.

## 2016-09-12 NOTE — Assessment & Plan Note (Signed)
SYMPTOMS CONTROLLED/RESOLVED.  CONTINUE TO MONITOR SYMPTOMS. 

## 2016-09-12 NOTE — Progress Notes (Signed)
Subjective:    Patient ID: Lynn Weaver, female    DOB: May 16, 1925, 83 y.o.   MRN: [id number]  Glo Herring., MD  HPI Trouble with swallowing. DOESN'T FEEL HEARTBURN. FEEL LIKE THINGS BUBBLE BACKUP. TROUBLE WITH SOLIDS DEPENDING ON WHAT SHE EATS.  BMS: GOOD. APPETITE: GOOD. ENERGY LEVEL: GOOD. MAY HAVE COUGHING SPELLS.   PT DENIES FEVER, CHILLS, HEMATOCHEZIA, HEMATEMESIS, nausea, vomiting, melena, diarrhea, CHEST PAIN, SHORTNESS OF BREATH,  CHANGE IN BOWEL IN HABITS, constipation, abdominal pain, OR heartburn or indigestion.  Past Medical History:  Diagnosis Date  . Anemia   . Hypertension     Past Surgical History:  Procedure Laterality Date  . CATARACT EXTRACTION W/PHACO  05/29/2012   Procedure: CATARACT EXTRACTION PHACO AND INTRAOCULAR LENS PLACEMENT (IOC);  Surgeon: Tonny Branch, MD;  Location: [facility];  Service: Ophthalmology;  Laterality: Right;  CDE:18.72  . CHOLECYSTECTOMY    . COLONOSCOPY N/A 06/24/2013   Significant diverticular disease. Large hemorrhoids. Terminal ileum normal. 2 rectosigmoid polyps (TA) removed. No old or fresh blood noted during exam. Likely had bleeding from diverticula and hemorrhoids. consider flex sig in 3 years.  Marland Kitchen LEFT AND RIGHT HEART CATHETERIZATION WITH CORONARY ANGIOGRAM N/A 11/23/2013   Procedure: LEFT AND RIGHT HEART CATHETERIZATION WITH CORONARY ANGIOGRAM;  Surgeon: Birdie Riddle, MD;  Location: [facility];  Service: Cardiovascular;  Laterality: N/A;   Past Surgical History:  Procedure Laterality Date  . CATARACT EXTRACTION W/PHACO  05/29/2012   Procedure: CATARACT EXTRACTION PHACO AND INTRAOCULAR LENS PLACEMENT (IOC);  Surgeon: Tonny Branch, MD;  Location: [facility];  Service: Ophthalmology;  Laterality: Right;  CDE:18.72  . CHOLECYSTECTOMY    . COLONOSCOPY N/A 06/24/2013   Significant diverticular disease. Large hemorrhoids. Terminal ileum normal. 2 rectosigmoid polyps (TA) removed. No old or fresh blood noted during exam. Likely had bleeding  from diverticula and hemorrhoids. consider flex sig in 3 years.  Marland Kitchen LEFT AND RIGHT HEART CATHETERIZATION WITH CORONARY ANGIOGRAM N/A 11/23/2013   Procedure: LEFT AND RIGHT HEART CATHETERIZATION WITH CORONARY ANGIOGRAM;  Surgeon: Birdie Riddle, MD;  Location: [facility];  Service: Cardiovascular;  Laterality: N/A;    No Known Allergies  Current Outpatient Prescriptions  Medication Sig Dispense Refill  . acetaminophen (TYLENOL) 325 MG tablet Take 325 mg by mouth 2 (two) times daily as needed (for pain). For pain    . albuterol (PROVENTIL) (2.5 MG/3ML) 0.083% nebulizer solution Take 2.5 mg by nebulization every 6 (six) hours as needed for wheezing or shortness of breath.    Marland Kitchen aspirin EC 81 MG EC tablet Take 1 tablet (81 mg total) by mouth daily.    Marland Kitchen lisinopril (PRINIVIL,ZESTRIL) 10 MG tablet Take 10 mg by mouth daily.    . metoprolol tartrate (LOPRESSOR) 12.5 mg TABS tablet Take 0.5 tablets (12.5 mg total) by mouth 2 (two) times daily.    . digoxin (LANOXIN) 0.125 MG tablet Take 1 tablet (0.125 mg total) by mouth daily. (Patient not taking: Reported on 09/12/2016)    . ferrous sulfate 325 (65 FE) MG tablet Take 325 mg by mouth daily with breakfast.    .      .      . potassium chloride (K-DUR) 10 MEQ tablet Take 10 mEq by mouth 3 (three) times a week.      Review of Systems PER HPI OTHERWISE ALL SYSTEMS ARE NEGATIVE.    Objective:   Physical Exam  Constitutional: She is oriented to person, place, and time. She appears well-developed and  well-nourished. No distress.  HENT:  Head: Normocephalic and atraumatic.  Mouth/Throat: Oropharynx is clear and moist. No oropharyngeal exudate.  Eyes: Pupils are equal, round, and reactive to light. No scleral icterus.  Neck: Normal range of motion. Neck supple.  Cardiovascular: Normal rate, regular rhythm and normal heart sounds.   Pulmonary/Chest: Effort normal and breath sounds normal. No respiratory distress.  Abdominal: Soft. Bowel sounds are  normal. She exhibits no distension. There is no tenderness.  Musculoskeletal: She exhibits no edema.  Lymphadenopathy:    She has no cervical adenopathy.  Neurological: She is alert and oriented to person, place, and time.  NO NEW FOCAL DEFICITS  Psychiatric: She has a normal mood and affect.  Vitals reviewed.     Assessment & Plan:

## 2016-09-12 NOTE — Patient Instructions (Signed)
FOLLOW A SOFT MECHANICAL DIET.  MEATS SHOULD BE CHOPPED,PULLED, BOILED, OR GROUND ONLY. SEE INFO BELOW.  COMPLETE UPPER ENDOSCOPY TO STRETCH YOUR ESOPHAGUS IN 2-3 WEEKS.  FOLLOW UP IN 4 MOS.    SOFT MECHANICAL DIET This SOFT MECHANICAL DIET is restricted to:  Foods that are moist, soft-textured, and easy to chew and swallow.   Meats that are ground or are minced no larger than one-quarter inch pieces. Meats are moist with gravy or sauce added.   Foods that do not include bread or bread-like textures except soft pancakes, well-moistened with syrup or sauce.   Textures with some chewing ability required.   Casseroles without rice.   Cooked vegetables that are less than half an inch in size and easily mashed with a fork. No cooked corn, peas, broccoli, cauliflower, cabbage, Brussels sprouts, asparagus, or other fibrous, non-tender or rubbery cooked vegetables.   Canned fruit except for pineapple. Fruit must be cut into pieces no larger than half an inch in size.   Foods that do not include nuts, seeds, coconut, or sticky textures.   FOOD TEXTURES FOR DYSPHAGIA DIET LEVEL 2 -SOFT MECHANICAL DIET (includes all foods on Dysphagia Diet Level 1 - Pureed, in addition to the foods listed below)  FOOD GROUP: Breads. RECOMMENDED: Soft pancakes, well-moistened with syrup or sauce.  AVOID: All others.  FOOD GROUP: Cereals.  RECOMMENDED: Cooked cereals with little texture, including oatmeal. Unprocessed wheat bran stirred into cereals for bulk. Note: If thin liquids are restricted, it is important that all of the liquid is absorbed into the cereal.  AVOID: All dry cereals and any cooked cereals that may contain flax seeds or other seeds or nuts. Whole-grain, dry, or coarse cereals. Cereals with nuts, seeds, dried fruit, and/or coconut.  FOOD GROUP: Desserts. RECOMMENDED: Pudding, custard. Soft fruit pies with bottom crust only. Canned fruit (excluding pineapple). Soft, moist cakes with  icing.Frozen malts, milk shakes, frozen yogurt, eggnog, nutritional supplements, ice cream, sherbet, regular or sugar-free gelatin, or any foods that become thin liquid at either room (70 F) or body temperature (98 F).  AVOID: Dry, coarse cakes and cookies. Anything with nuts, seeds, coconut, pineapple, or dried fruit. Breakfast yogurt with nuts. Rice or bread pudding.  FOOD GROUP: Fats. RECOMMENDED: Butter, margarine, cream for cereal (depending on liquid consistency recommendations), gravy, cream sauces, sour cream, sour cream dips with soft additives, mayonnaise, salad dressings, cream cheese, cream cheese spreads with soft additives, whipped toppings.  AVOID: All fats with coarse or chunky additives.  FOOD GROUP: Fruits. RECOMMENDED: Soft drained, canned, or cooked fruits without seeds or skin. Fresh soft and ripe banana. Fruit juices with a small amount of pulp. If thin liquids are restricted, fruit juices should be thickened to appropriate consistency.  AVOID: Fresh or frozen fruits. Cooked fruit with skin or seeds. Dried fruits. Fresh, canned, or cooked pineapple.  FOOD GROUP: Meats and Meat Substitutes. (Meat pieces should not exceed 1/4 of an inch cube and should be tender.) RECOMMENDED: Moistened ground or cooked meat, poultry, or fish. Moist ground or tender meat may be served with gravy or sauce. Casseroles without rice. Moist macaroni and cheese, well-cooked pasta with meat sauce, tuna noodle casserole, soft, moist lasagna. Moist meatballs, meatloaf, or fish loaf. Protein salads, such as tuna or egg without large chunks, celery, or onion. Cottage cheese, smooth quiche without large chunks. Poached, scrambled, or soft-cooked eggs (egg yolks should not be "runny" but should be moist and able to be mashed with butter, margarine, or  other moisture added to them). (Cook eggs to 160 F or use pasteurized eggs for safety.) Souffls may have small, soft chunks. Tofu. Well-cooked, slightly  mashed, moist legumes, such as baked beans. All meats or protein substitutes should be served with sauces or moistened to help maintain cohesiveness in the oral cavity.  AVOID: Dry meats, tough meats (such as bacon, sausage, hot dogs, bratwurst). Dry casseroles or casseroles with rice or large chunks. Peanut butter. Cheese slices and cubes. Hard-cooked or crisp fried eggs. Sandwiches.Pizza.  FOOD GROUP: Potatoes and Starches. RECOMMENDED: Well-cooked, moistened, boiled, baked, or mashed potatoes. Well-cooked shredded hash brown potatoes that are not crisp. (All potatoes need to be moist and in sauces.)Well-cooked noodles in sauce. Spaetzel or soft dumplings that have been moistened with butter or gravy.  AVOID: Potato skins and chips. Fried or French-fried potatoes. Rice.  FOOD GROUP: Soups. RECOMMENDED: Soups with easy-to-chew or easy-to-swallow meats or vegetables: Particle sizes in soups should be less than 1/2 inch. Soups will need to be thickened to appropriate consistency if soup is thinner than prescribed liquid consistency.  AVOID: Soups with large chunks of meat and vegetables. Soups with rice, corn, peas.  FOOD GROUP: Vegetables. RECOMMENDED: All soft, well-cooked vegetables. Vegetables should be less than a half inch. Should be easily mashed with a fork.  AVOID: Cooked corn and peas. Broccoli, cabbage, Brussels sprouts, asparagus, or other fibrous, non-tender or rubbery cooked vegetables.  FOOD GROUP: Miscellaneous. RECOMMENDED: Jams and preserves without seeds, jelly. Sauces, salsas, etc., that may have small tender chunks less than 1/2 inch. Soft, smooth chocolate bars that are easily chewed.  AVOID: Seeds, nuts, coconut, or sticky foods. Chewy candies such as caramels or licorice.

## 2016-09-26 ENCOUNTER — Telehealth: Payer: Self-pay

## 2016-09-26 NOTE — Telephone Encounter (Signed)
Pt didn't want to schedule EGD/Dil during last office visit. She was going to think about it and call back. We have not heard from pt. Letter mailed.

## 2016-11-07 ENCOUNTER — Emergency Department (HOSPITAL_COMMUNITY): Payer: Medicare PPO

## 2016-11-07 ENCOUNTER — Emergency Department (HOSPITAL_COMMUNITY)
Admission: EM | Admit: 2016-11-07 | Discharge: 2016-11-07 | Disposition: A | Discharge status: Home / Self Care | Payer: Medicare PPO | Attending: Emergency Medicine | Admitting: Emergency Medicine

## 2016-11-07 ENCOUNTER — Encounter (HOSPITAL_COMMUNITY): Payer: Self-pay | Admitting: *Deleted

## 2016-11-07 DIAGNOSIS — Z7982 Long term (current) use of aspirin: Secondary | ICD-10-CM | POA: Insufficient documentation

## 2016-11-07 DIAGNOSIS — I110 Hypertensive heart disease with heart failure: Secondary | ICD-10-CM | POA: Diagnosis not present

## 2016-11-07 DIAGNOSIS — R109 Unspecified abdominal pain: Secondary | ICD-10-CM | POA: Diagnosis not present

## 2016-11-07 DIAGNOSIS — R05 Cough: Secondary | ICD-10-CM | POA: Diagnosis not present

## 2016-11-07 DIAGNOSIS — Z79899 Other long term (current) drug therapy: Secondary | ICD-10-CM | POA: Insufficient documentation

## 2016-11-07 DIAGNOSIS — R531 Weakness: Secondary | ICD-10-CM

## 2016-11-07 DIAGNOSIS — K5730 Diverticulosis of large intestine without perforation or abscess without bleeding: Secondary | ICD-10-CM | POA: Diagnosis not present

## 2016-11-07 DIAGNOSIS — I5021 Acute systolic (congestive) heart failure: Secondary | ICD-10-CM | POA: Insufficient documentation

## 2016-11-07 HISTORY — DX: Diverticulosis of intestine, part unspecified, without perforation or abscess without bleeding: K57.90

## 2016-11-07 HISTORY — DX: Gastrointestinal hemorrhage, unspecified: K92.2

## 2016-11-07 HISTORY — DX: Unspecified hemorrhoids: K64.9

## 2016-11-07 LAB — URINALYSIS, ROUTINE W REFLEX MICROSCOPIC
Bilirubin Urine: NEGATIVE
GLUCOSE, UA: NEGATIVE mg/dL
HGB URINE DIPSTICK: NEGATIVE
KETONES UR: NEGATIVE mg/dL
Nitrite: NEGATIVE
PH: 7.5 (ref 5.0–8.0)
Protein, ur: NEGATIVE mg/dL
Specific Gravity, Urine: 1.01 (ref 1.005–1.030)

## 2016-11-07 LAB — LACTIC ACID, PLASMA: LACTIC ACID, VENOUS: 1.5 mmol/L (ref 0.5–1.9)

## 2016-11-07 LAB — CBC WITH DIFFERENTIAL/PLATELET
BASOS PCT: 1 %
Basophils Absolute: 0 10*3/uL (ref 0.0–0.1)
EOS ABS: 0.6 10*3/uL (ref 0.0–0.7)
Eosinophils Relative: 16 %
HCT: 37.5 % (ref 36.0–46.0)
Hemoglobin: 12.1 g/dL (ref 12.0–15.0)
Lymphocytes Relative: 43 %
Lymphs Abs: 1.5 10*3/uL (ref 0.7–4.0)
MCH: 28.9 pg (ref 26.0–34.0)
MCHC: 32.3 g/dL (ref 30.0–36.0)
MCV: 89.7 fL (ref 78.0–100.0)
MONO ABS: 0.3 10*3/uL (ref 0.1–1.0)
Monocytes Relative: 7 %
Neutro Abs: 1.2 10*3/uL — ABNORMAL LOW (ref 1.7–7.7)
Neutrophils Relative %: 33 %
PLATELETS: 336 10*3/uL (ref 150–400)
RBC: 4.18 MIL/uL (ref 3.87–5.11)
RDW: 14.5 % (ref 11.5–15.5)
WBC: 3.5 10*3/uL — ABNORMAL LOW (ref 4.0–10.5)

## 2016-11-07 LAB — URINALYSIS, MICROSCOPIC (REFLEX)

## 2016-11-07 LAB — COMPREHENSIVE METABOLIC PANEL
ALT: 9 U/L — AB (ref 14–54)
AST: 21 U/L (ref 15–41)
Albumin: 3.8 g/dL (ref 3.5–5.0)
Alkaline Phosphatase: 54 U/L (ref 38–126)
Anion gap: 7 (ref 5–15)
BILIRUBIN TOTAL: 0.7 mg/dL (ref 0.3–1.2)
BUN: 10 mg/dL (ref 6–20)
CO2: 33 mmol/L — ABNORMAL HIGH (ref 22–32)
CREATININE: 1.05 mg/dL — AB (ref 0.44–1.00)
Calcium: 9.3 mg/dL (ref 8.9–10.3)
Chloride: 100 mmol/L — ABNORMAL LOW (ref 101–111)
GFR, EST AFRICAN AMERICAN: 53 mL/min — AB (ref >60)
GFR, EST NON AFRICAN AMERICAN: 45 mL/min — AB (ref >60)
Glucose, Bld: 106 mg/dL — ABNORMAL HIGH (ref 65–99)
POTASSIUM: 3.9 mmol/L (ref 3.5–5.1)
Sodium: 140 mmol/L (ref 135–145)
TOTAL PROTEIN: 7.2 g/dL (ref 6.5–8.1)

## 2016-11-07 LAB — I-STAT CG4 LACTIC ACID, ED: LACTIC ACID, VENOUS: 2.13 mmol/L — AB (ref 0.5–1.9)

## 2016-11-07 LAB — LIPASE, BLOOD: LIPASE: 23 U/L (ref 11–51)

## 2016-11-07 LAB — TROPONIN I
TROPONIN I: 0.03 ng/mL — AB (ref <0.03)
Troponin I: 0.03 ng/mL (ref <0.03)

## 2016-11-07 MED ORDER — SODIUM CHLORIDE 0.9 % IV BOLUS (SEPSIS)
500.0000 mL | Freq: Once | INTRAVENOUS | Status: AC
  Administered 2016-11-07: 500 mL via INTRAVENOUS

## 2016-11-07 NOTE — Discharge Instructions (Signed)
Take your usual prescriptions as previously directed.  Call your regular medical doctor today to schedule a follow up appointment within the next 2 days.  Return to the Emergency Department immediately sooner if worsening.  ° °

## 2016-11-07 NOTE — ED Triage Notes (Signed)
Pt comes in with family. She describes a feeling of generalized weakness. Pt states she used several nebulizer treatments yesterday and then began having stomach pains. Denies any n/v/d. States, "my bowels are moving. Not too quick or too slow, but they moving." NAD noted. NAD noted.

## 2016-11-07 NOTE — ED Notes (Signed)
Pt ambulated without difficulty to bathroom.  Pt taken to CT at this time.

## 2016-11-07 NOTE — ED Notes (Signed)
Pt made aware to return if symptoms worsen or if any life threatening symptoms occur.   

## 2016-11-07 NOTE — ED Notes (Signed)
Pt given water to drink. 

## 2016-11-07 NOTE — ED Notes (Signed)
CRITICAL VALUE ALERT  Critical value received:  Trop 0.03  Date of notification:  11/07/16  Time of notification:  0920  Critical value read back:Yes.    Nurse who received alert:  Rminter, RN  MD notified (1st page):  Dr. Thurnell Garbe  Time of first page:  0920  MD notified (2nd page):  Time of second page:  Responding MD:  Dr. Thurnell Garbe  Time MD responded:  [phone]

## 2016-11-07 NOTE — ED Provider Notes (Signed)
[facility] DEPT Provider Note   CSN: [id number] Arrival date & time: 11/07/16  0757     History   Chief Complaint Chief Complaint  Patient presents with  . Weakness    HPI Lynn Weaver is a 83 y.o. female.  HPI  Pt was seen at 0830. Per pt, c/o gradual onset and persistence of constant generalized weakness for the past 3 to 4 days. Pt states she has a chronic cough, took several nebs treatments yesterday and then had "stomach pains." Denies abd pain currently. Denies N/V/D, no back pain, no CP/SOB, no fevers, no focal motor weakness, no tingling/numbness in extremities.    Past Medical History:  Diagnosis Date  . Anemia   . Diverticulosis   . GI bleed    diverticular/hemorrhoidal  . Hemorrhoids   . Hypertension     Patient Active Problem List   Diagnosis Date Noted  . Dysphagia, idiopathic 09/12/2016  . Non-STEMI (non-ST elevated myocardial infarction) ([place]) 11/17/2013  . Acute systolic CHF (congestive heart failure) ([place]) 11/17/2013  . BRBPR (bright red blood per rectum) 06/23/2013  . Hypertension     Past Surgical History:  Procedure Laterality Date  . CATARACT EXTRACTION W/PHACO  05/29/2012   Procedure: CATARACT EXTRACTION PHACO AND INTRAOCULAR LENS PLACEMENT (IOC);  Surgeon: Tonny Branch, MD;  Location: [facility];  Service: Ophthalmology;  Laterality: Right;  CDE:18.72  . CHOLECYSTECTOMY    . COLONOSCOPY N/A 06/24/2013   Significant diverticular disease. Large hemorrhoids. Terminal ileum normal. 2 rectosigmoid polyps (TA) removed. No old or fresh blood noted during exam. Likely had bleeding from diverticula and hemorrhoids. consider flex sig in 3 years.  Marland Kitchen LEFT AND RIGHT HEART CATHETERIZATION WITH CORONARY ANGIOGRAM N/A 11/23/2013   Procedure: LEFT AND RIGHT HEART CATHETERIZATION WITH CORONARY ANGIOGRAM;  Surgeon: Birdie Riddle, MD;  Location: [facility];  Service: Cardiovascular;  Laterality: N/A;    OB History    No data available       Home Medications      Prior to Admission medications   Medication Sig Start Date End Date Taking? Authorizing Provider  acetaminophen (TYLENOL) 325 MG tablet Take 325 mg by mouth 2 (two) times daily as needed (for pain). For pain   Yes Historical Provider, MD  albuterol (PROVENTIL) (2.5 MG/3ML) 0.083% nebulizer solution Take 2.5 mg by nebulization every 6 (six) hours as needed for wheezing or shortness of breath.   Yes Historical Provider, MD  aspirin EC 81 MG EC tablet Take 1 tablet (81 mg total) by mouth daily. 11/24/13  Yes Dixie Dials, MD  ferrous sulfate 325 (65 FE) MG tablet Take 325 mg by mouth daily with breakfast.   Yes Historical Provider, MD  lisinopril (PRINIVIL,ZESTRIL) 10 MG tablet Take 10 mg by mouth daily.   Yes Historical Provider, MD  metoprolol tartrate (LOPRESSOR) 12.5 mg TABS tablet Take 0.5 tablets (12.5 mg total) by mouth 2 (two) times daily. 11/24/13  Yes Dixie Dials, MD  potassium chloride (K-DUR) 10 MEQ tablet Take 10 mEq by mouth 3 (three) times a week.    Yes Historical Provider, MD  digoxin (LANOXIN) 0.125 MG tablet Take 1 tablet (0.125 mg total) by mouth daily. Patient not taking: Reported on 09/12/2016 11/24/13   Dixie Dials, MD  furosemide (LASIX) 20 MG tablet Take 1 tablet (20 mg total) by mouth daily. Patient not taking: Reported on 09/12/2016 11/24/13   Dixie Dials, MD    Family History Family History  Problem Relation Age of Onset  .  Heart disease Father   . Colon cancer Sister     age 78    Social History Social History  Substance Use Topics  . Smoking status: Never Smoker  . Smokeless tobacco: Never Used  . Alcohol use No     Allergies   Patient has no known allergies.   Review of Systems Review of Systems ROS: Statement: All systems negative except as marked or noted in the HPI; Constitutional: Negative for fever and chills. +generalized weakness.; ; Eyes: Negative for eye pain, redness and discharge. ; ; ENMT: Negative for ear pain, hoarseness, nasal congestion,  sinus pressure and sore throat. ; ; Cardiovascular: Negative for chest pain, palpitations, diaphoresis, dyspnea and peripheral edema. ; ; Respiratory: +cough. Negative for wheezing and stridor. ; ; Gastrointestinal: +abd pain. Negative for nausea, vomiting, diarrhea, blood in stool, hematemesis, jaundice and rectal bleeding. . ; ; Genitourinary: Negative for dysuria, flank pain and hematuria. ; ; Musculoskeletal: Negative for back pain and neck pain. Negative for swelling and trauma.; ; Skin: Negative for pruritus, rash, abrasions, blisters, bruising and skin lesion.; ; Neuro: Negative for headache, lightheadedness and neck stiffness. Negative for altered level of consciousness, altered mental status, extremity weakness, paresthesias, involuntary movement, seizure and syncope.       Physical Exam Updated Vital Signs BP (!) 148/91   Pulse 81   Temp 97.8 F (36.6 C) (Oral)   Resp 19   Ht 5\' 4"  (1.626 m)   Wt 123 lb (55.8 kg)   SpO2 96%   BMI 21.11 kg/m    09:13 Orthostatic Vital Signs JM  Orthostatic Lying   BP- Lying: 172/76  Pulse- Lying: 78      Orthostatic Sitting  BP- Sitting: 148/89  Pulse- Sitting: 80      Orthostatic Standing at 0 minutes  BP- Standing at 0 minutes:  148/91  Pulse- Standing at 0 minutes: 84   Patient Vitals for the past 24 hrs:  BP Temp Temp src Pulse Resp SpO2 Height Weight  11/07/16 1234 - - - - - 93 % - -  11/07/16 1230 (!) 179/86 - - - 18 - - -  11/07/16 1200 (!) 171/104 - - - - - - -  11/07/16 1030 (!) 133/115 - - - - - - -  11/07/16 0909 - - - 81 19 96 % - -  11/07/16 0908 (!) 148/91 - - - - - - -  11/07/16 0900 (!) 117/98 - - - 18 - - -  11/07/16 0830 - - - 81 20 96 % - -  11/07/16 0817 (!) 186/96 97.8 F (36.6 C) Oral 72 18 94 % - -  11/07/16 0815 - - - - - - 5\' 4"  (1.626 m) 123 lb (55.8 kg)     Physical Exam 0835: Physical examination:  Nursing notes reviewed; Vital signs and O2 SAT reviewed;  Constitutional: Well developed, Well  nourished, Well hydrated, In no acute distress; Head:  Normocephalic, atraumatic; Eyes: EOMI, PERRL, No scleral icterus; ENMT: Mouth and pharynx normal, Mucous membranes moist; Neck: Supple, Full range of motion, No lymphadenopathy; Cardiovascular: Regular rate and rhythm, No gallop; Respiratory: Breath sounds clear & equal bilaterally, No wheezes.  Speaking full sentences with ease, Normal respiratory effort/excursion; Chest: Nontender, Movement normal; Abdomen: Soft, Nontender, Nondistended, Normal bowel sounds; Genitourinary: No CVA tenderness; Extremities: Pulses normal, No tenderness, No edema, No calf edema or asymmetry.; Neuro: AA&Ox3, Major CN grossly intact.  Speech clear. No gross focal motor or sensory deficits  in extremities.; Skin: Color normal, Warm, Dry.   ED Treatments / Results  Labs (all labs ordered are listed, but only abnormal results are displayed)   EKG  EKG Interpretation  Date/Time:  Wednesday November 07 2016 08:21:24 EDT Ventricular Rate:  78 PR Interval:    QRS Duration: 133 QT Interval:  428 QTC Calculation: 488 R Axis:   -61 Text Interpretation:  Sinus rhythm Ventricular premature complex RBBB and LAFB Baseline wander When compared with ECG of 11/18/2013 No significant change was found Confirmed by [facility]  MD, Nunzio Cory [phone]) on 11/07/2016 8:48:24 AM       Radiology   Procedures Procedures (including critical care time)  Medications Ordered in ED Medications - No data to display   Initial Impression / Assessment and Plan / ED Course  I have reviewed the triage vital signs and the nursing notes.  Pertinent labs & imaging results that were available during my care of the patient were reviewed by me and considered in my medical decision making (see chart for details).  MDM Reviewed: previous chart, nursing note and vitals Reviewed previous: labs and ECG Interpretation: labs, ECG, x-ray and CT scan    Results for orders placed or performed during  the hospital encounter of 11/07/16  Urinalysis, Routine w reflex microscopic  Result Value Ref Range   Color, Urine YELLOW YELLOW   APPearance CLEAR CLEAR   Specific Gravity, Urine 1.010 1.005 - 1.030   pH 7.5 5.0 - 8.0   Glucose, UA NEGATIVE NEGATIVE mg/dL   Hgb urine dipstick NEGATIVE NEGATIVE   Bilirubin Urine NEGATIVE NEGATIVE   Ketones, ur NEGATIVE NEGATIVE mg/dL   Protein, ur NEGATIVE NEGATIVE mg/dL   Nitrite NEGATIVE NEGATIVE   Leukocytes, UA SMALL (A) NEGATIVE  Comprehensive metabolic panel  Result Value Ref Range   Sodium 140 135 - 145 mmol/L   Potassium 3.9 3.5 - 5.1 mmol/L   Chloride 100 (L) 101 - 111 mmol/L   CO2 33 (H) 22 - 32 mmol/L   Glucose, Bld 106 (H) 65 - 99 mg/dL   BUN 10 6 - 20 mg/dL   Creatinine, Ser 1.05 (H) 0.44 - 1.00 mg/dL   Calcium 9.3 8.9 - 10.3 mg/dL   Total Protein 7.2 6.5 - 8.1 g/dL   Albumin 3.8 3.5 - 5.0 g/dL   AST 21 15 - 41 U/L   ALT 9 (L) 14 - 54 U/L   Alkaline Phosphatase 54 38 - 126 U/L   Total Bilirubin 0.7 0.3 - 1.2 mg/dL   GFR calc non Af Amer 45 (L) >60 mL/min   GFR calc Af Amer 53 (L) >60 mL/min   Anion gap 7 5 - 15  Lipase, blood  Result Value Ref Range   Lipase 23 11 - 51 U/L  Troponin I  Result Value Ref Range   Troponin I 0.03 (HH) <0.03 ng/mL  CBC with Differential  Result Value Ref Range   WBC 3.5 (L) 4.0 - 10.5 K/uL   RBC 4.18 3.87 - 5.11 MIL/uL   Hemoglobin 12.1 12.0 - 15.0 g/dL   HCT 37.5 36.0 - 46.0 %   MCV 89.7 78.0 - 100.0 fL   MCH 28.9 26.0 - 34.0 pg   MCHC 32.3 30.0 - 36.0 g/dL   RDW 14.5 11.5 - 15.5 %   Platelets 336 150 - 400 K/uL   Neutrophils Relative % 33 %   Neutro Abs 1.2 (L) 1.7 - 7.7 K/uL   Lymphocytes Relative 43 %   Lymphs  Abs 1.5 0.7 - 4.0 K/uL   Monocytes Relative 7 %   Monocytes Absolute 0.3 0.1 - 1.0 K/uL   Eosinophils Relative 16 %   Eosinophils Absolute 0.6 0.0 - 0.7 K/uL   Basophils Relative 1 %   Basophils Absolute 0.0 0.0 - 0.1 K/uL  Lactic acid, plasma  Result Value Ref Range    Lactic Acid, Venous 1.5 0.5 - 1.9 mmol/L  Urinalysis, Microscopic (reflex)  Result Value Ref Range   RBC / HPF 0-5 0 - 5 RBC/hpf   WBC, UA 0-5 0 - 5 WBC/hpf   Bacteria, UA RARE (A) NONE SEEN   Squamous Epithelial / LPF 0-5 (A) NONE SEEN  Troponin I  Result Value Ref Range   Troponin I 0.03 (HH) <0.03 ng/mL  I-Stat CG4 Lactic Acid, ED  Result Value Ref Range   Lactic Acid, Venous 2.13 (HH) 0.5 - 1.9 mmol/L   Comment NOTIFIED PHYSICIAN    Ct Abdomen Pelvis Wo Contrast Result Date: 11/07/2016 CLINICAL DATA:  83 year old female with generalized weakness. Abdominal pain. Initial encounter. EXAM: CT ABDOMEN AND PELVIS WITHOUT CONTRAST TECHNIQUE: Multidetector CT imaging of the abdomen and pelvis was performed following the standard protocol without IV contrast. COMPARISON:  CT Abdomen and Pelvis 11/17/2013 FINDINGS: Lower chest: Chronic cardiomegaly. No pericardial effusion. Clear lung bases today. Hepatobiliary: Stable small hypodense areas scattered in the liver, compatible with small benign cysts. Diminutive or absent gallbladder. Pancreas: Negative. Spleen: Negative. Adrenals/Urinary Tract: Stable mild adrenal gland thickening. Negative noncontrast left kidney and left ureter. Negative noncontrast right kidney and right ureter. Distended but otherwise unremarkable urinary bladder. Stomach/Bowel: Decompressed rectum. Mild to moderate diverticulosis in the proximal sigmoid and left colon but no active inflammation identified. Moderate to severe diverticulosis throughout the transverse colon and at the flexures, more so on the right. Moderate diverticulosis throughout the right colon. No active inflammation. Appendix appears stable and within normal limits. Negative terminal ileum. Decompressed small bowel throughout the abdomen. Decompressed stomach and duodenum. No abdominal free fluid. Vascular/Lymphatic: Vascular patency is not evaluated in the absence of IV contrast. Aortoiliac calcified  atherosclerosis. No lymphadenopathy. Reproductive: Chronic bulky fibroid enlargement with numerous confluent calcified uterine fibroids. As in 2015 the largest individual fibroid is a noncalcified 8 cm diameter mass along the right fundus. Individual calcified fibroids measure up to 6.6 cm diameter. The appearance has not significantly changed since 2015. Ovaries are not delineated. Other: No pelvic free fluid. Small chronic fat containing umbilical hernia is unchanged. Partial fatty atrophy of the right rectus muscle is chronic. Musculoskeletal: Osteopenia. No acute osseous abnormality identified. IMPRESSION: 1. No acute or inflammatory process identified in the noncontrast abdomen or pelvis. No urologic calculus or obstructive uropathy. 2. Widespread diverticulosis of the large bowel with no active inflammation to suggest diverticulitis. 3. Chronic bulky fibroid uterus. Numerous fibroids up to 8 cm diameter, many densely calcified. 4.  Calcified aortic atherosclerosis. Electronically Signed   By: Genevie Ann M.D.   On: 11/07/2016 10:38   Dg Chest 2 View Result Date: 11/07/2016 CLINICAL DATA:  Generalized weakness. EXAM: CHEST  2 VIEW COMPARISON:  Chest x-ray 01/18/2016 . FINDINGS: Right costophrenic angle incompletely imaged. Mediastinum and hilar structures are normal. Lungs are clear. No pleural effusion or pneumothorax. Heart size normal. Diffuse osteopenia and degenerative changes thoracic spine. IMPRESSION: No acute cardiopulmonary disease. Electronically Signed   By: Marcello Moores  Register   On: 11/07/2016 08:57   Ct Head Wo Contrast Result Date: 11/07/2016 CLINICAL DATA:  Generalized weakness since nebulizer  treatment 11/06/16 EXAM: CT HEAD WITHOUT CONTRAST TECHNIQUE: Contiguous axial images were obtained from the base of the skull through the vertex without intravenous contrast. COMPARISON:  Head CT report 03/14/2001 no images available FINDINGS: Brain: No intracranial hemorrhage, mass effect or midline shift.  Mild cerebral atrophy. No acute cortical infarction. There is moderate periventricular and patchy subcortical white matter decreased attenuation probable due to chronic small vessel ischemic changes. No mass lesion is noted on this unenhanced scan. Vascular: Mild atherosclerotic calcifications of carotid siphon Skull: No skull fracture is noted. Sinuses/Orbits: No acute findings Other: None IMPRESSION: No acute intracranial abnormality. No definite acute cortical infarction. Mild cerebral atrophy. Moderate periventricular and patchy subcortical white matter decreased attenuation probable due to chronic small vessel ischemic changes. Electronically Signed   By: Lahoma Crocker M.D.   On: 11/07/2016 09:14    1330:  Troponin top normal x2 (has hx chronically elevated troponin). Lactic acid minimally elevated; trended downward after judicious IVF. Pt has tol PO well while in the ED without N/V.  No stooling while in the ED.  Abd remains benign, VSS. Continues to deny CP/SOB/abd pain. Neuro exam remains intact and unchanged. Pt not orthostatic on VS. Pt has ambulated with steady gait. Feels better and wants to go home now. No clear indication for admission at this time. Return precautions given. Dx and testing d/w pt and family.  Questions answered.  Verb understanding, agreeable to d/c home with outpt f/u.     Final Clinical Impressions(s) / ED Diagnoses   Final diagnoses:  None    New Prescriptions New Prescriptions   No medications on file     Francine Graven, DO 11/11/16 1630

## 2016-11-08 LAB — URINE CULTURE

## 2016-11-29 ENCOUNTER — Emergency Department (HOSPITAL_COMMUNITY): Payer: Medicare PPO

## 2016-11-29 ENCOUNTER — Encounter (HOSPITAL_COMMUNITY): Payer: Self-pay | Admitting: Emergency Medicine

## 2016-11-29 ENCOUNTER — Emergency Department (HOSPITAL_COMMUNITY)
Admission: EM | Admit: 2016-11-29 | Discharge: 2016-11-29 | Disposition: A | Discharge status: Home / Self Care | Payer: Medicare PPO | Attending: Emergency Medicine | Admitting: Emergency Medicine

## 2016-11-29 DIAGNOSIS — R200 Anesthesia of skin: Secondary | ICD-10-CM | POA: Diagnosis not present

## 2016-11-29 DIAGNOSIS — M6281 Muscle weakness (generalized): Secondary | ICD-10-CM | POA: Diagnosis not present

## 2016-11-29 DIAGNOSIS — I252 Old myocardial infarction: Secondary | ICD-10-CM | POA: Diagnosis not present

## 2016-11-29 DIAGNOSIS — Z7982 Long term (current) use of aspirin: Secondary | ICD-10-CM | POA: Insufficient documentation

## 2016-11-29 DIAGNOSIS — R531 Weakness: Secondary | ICD-10-CM | POA: Diagnosis not present

## 2016-11-29 DIAGNOSIS — I6789 Other cerebrovascular disease: Secondary | ICD-10-CM | POA: Diagnosis not present

## 2016-11-29 DIAGNOSIS — E876 Hypokalemia: Secondary | ICD-10-CM | POA: Diagnosis not present

## 2016-11-29 DIAGNOSIS — R42 Dizziness and giddiness: Secondary | ICD-10-CM | POA: Diagnosis not present

## 2016-11-29 DIAGNOSIS — I10 Essential (primary) hypertension: Secondary | ICD-10-CM | POA: Insufficient documentation

## 2016-11-29 DIAGNOSIS — R202 Paresthesia of skin: Secondary | ICD-10-CM | POA: Diagnosis not present

## 2016-11-29 LAB — URINALYSIS, ROUTINE W REFLEX MICROSCOPIC
Bilirubin Urine: NEGATIVE
GLUCOSE, UA: NEGATIVE mg/dL
Hgb urine dipstick: NEGATIVE
KETONES UR: NEGATIVE mg/dL
Nitrite: NEGATIVE
PROTEIN: NEGATIVE mg/dL
Specific Gravity, Urine: 1.006 (ref 1.005–1.030)
pH: 7 (ref 5.0–8.0)

## 2016-11-29 LAB — CBC WITH DIFFERENTIAL/PLATELET
BASOS ABS: 0 10*3/uL (ref 0.0–0.1)
Basophils Relative: 1 %
Eosinophils Absolute: 0.5 10*3/uL (ref 0.0–0.7)
Eosinophils Relative: 12 %
HEMATOCRIT: 35.8 % — AB (ref 36.0–46.0)
HEMOGLOBIN: 11.6 g/dL — AB (ref 12.0–15.0)
LYMPHS PCT: 31 %
Lymphs Abs: 1.3 10*3/uL (ref 0.7–4.0)
MCH: 29 pg (ref 26.0–34.0)
MCHC: 32.4 g/dL (ref 30.0–36.0)
MCV: 89.5 fL (ref 78.0–100.0)
Monocytes Absolute: 0.3 10*3/uL (ref 0.1–1.0)
Monocytes Relative: 6 %
NEUTROS ABS: 2.2 10*3/uL (ref 1.7–7.7)
Neutrophils Relative %: 50 %
PLATELETS: 295 10*3/uL (ref 150–400)
RBC: 4 MIL/uL (ref 3.87–5.11)
RDW: 14.8 % (ref 11.5–15.5)
WBC: 4.3 10*3/uL (ref 4.0–10.5)

## 2016-11-29 LAB — TROPONIN I: Troponin I: 0.03 ng/mL (ref <0.03)

## 2016-11-29 LAB — COMPREHENSIVE METABOLIC PANEL
ALT: 9 U/L — ABNORMAL LOW (ref 14–54)
ANION GAP: 9 (ref 5–15)
AST: 26 U/L (ref 15–41)
Albumin: 3.7 g/dL (ref 3.5–5.0)
Alkaline Phosphatase: 49 U/L (ref 38–126)
BILIRUBIN TOTAL: 0.9 mg/dL (ref 0.3–1.2)
BUN: 7 mg/dL (ref 6–20)
CHLORIDE: 101 mmol/L (ref 101–111)
CO2: 30 mmol/L (ref 22–32)
Calcium: 9 mg/dL (ref 8.9–10.3)
Creatinine, Ser: 1 mg/dL (ref 0.44–1.00)
GFR calc Af Amer: 56 mL/min — ABNORMAL LOW (ref >60)
GFR, EST NON AFRICAN AMERICAN: 48 mL/min — AB (ref >60)
Glucose, Bld: 97 mg/dL (ref 65–99)
POTASSIUM: 3 mmol/L — AB (ref 3.5–5.1)
Sodium: 140 mmol/L (ref 135–145)
TOTAL PROTEIN: 7 g/dL (ref 6.5–8.1)

## 2016-11-29 MED ORDER — POTASSIUM CHLORIDE ER 20 MEQ PO TBCR: 20.0000 meq | EXTENDED_RELEASE_TABLET | Freq: Every day | ORAL | 0 refills | Status: DC

## 2016-11-29 NOTE — ED Notes (Signed)
Patient walked to the bathroom with minimal assistance.  

## 2016-11-29 NOTE — ED Provider Notes (Signed)
[facility] DEPT Provider Note   CSN: [id number] Arrival date & time: 11/29/16  1638     History   Chief Complaint Chief Complaint  Patient presents with  . Weakness    HPI Lynn Weaver is a 82 y.o. female.  Pt presents to the ED today with right hand weakness.  The pt said she noticed some numbness in her right hand yesterday.  She noticed she could not hold her coffee cup this morning.  Now, she is fine.  She denies any other sx.      Past Medical History:  Diagnosis Date  . Anemia   . Diverticulosis   . GI bleed    diverticular/hemorrhoidal  . Hemorrhoids   . Hypertension     Patient Active Problem List   Diagnosis Date Noted  . Dysphagia, idiopathic 09/12/2016  . Non-STEMI (non-ST elevated myocardial infarction) ([place]) 11/17/2013  . Acute systolic CHF (congestive heart failure) ([place]) 11/17/2013  . BRBPR (bright red blood per rectum) 06/23/2013  . Hypertension     Past Surgical History:  Procedure Laterality Date  . CATARACT EXTRACTION W/PHACO  05/29/2012   Procedure: CATARACT EXTRACTION PHACO AND INTRAOCULAR LENS PLACEMENT (IOC);  Surgeon: Tonny Branch, MD;  Location: [facility];  Service: Ophthalmology;  Laterality: Right;  CDE:18.72  . CHOLECYSTECTOMY    . COLONOSCOPY N/A 06/24/2013   Significant diverticular disease. Large hemorrhoids. Terminal ileum normal. 2 rectosigmoid polyps (TA) removed. No old or fresh blood noted during exam. Likely had bleeding from diverticula and hemorrhoids. consider flex sig in 3 years.  Marland Kitchen LEFT AND RIGHT HEART CATHETERIZATION WITH CORONARY ANGIOGRAM N/A 11/23/2013   Procedure: LEFT AND RIGHT HEART CATHETERIZATION WITH CORONARY ANGIOGRAM;  Surgeon: Birdie Riddle, MD;  Location: [place] CATH [facility];  Service: Cardiovascular;  Laterality: N/A;    OB History    No data available       Home Medications    Prior to Admission medications   Medication Sig Start Date End Date Taking? Authorizing Provider  acetaminophen (TYLENOL) 325 MG  tablet Take 325 mg by mouth 2 (two) times daily as needed (for pain). For pain   Yes [provider]  albuterol (PROVENTIL) (2.5 MG/3ML) 0.083% nebulizer solution Take 2.5 mg by nebulization every 6 (six) hours as needed for wheezing or shortness of breath.   Yes [provider]  aspirin EC 81 MG EC tablet Take 1 tablet (81 mg total) by mouth daily. 11/24/13  Yes Dixie Dials, MD  digoxin (LANOXIN) 0.125 MG tablet Take 1 tablet (0.125 mg total) by mouth daily. 11/24/13  Yes Dixie Dials, MD  ferrous sulfate 325 (65 FE) MG tablet Take 325 mg by mouth daily with breakfast.   Yes [provider]  lisinopril (PRINIVIL,ZESTRIL) 10 MG tablet Take 10 mg by mouth daily.   Yes [provider]  metoprolol tartrate (LOPRESSOR) 12.5 mg TABS tablet Take 0.5 tablets (12.5 mg total) by mouth 2 (two) times daily. 11/24/13  Yes Dixie Dials, MD  furosemide (LASIX) 20 MG tablet Take 1 tablet (20 mg total) by mouth daily. Patient not taking: Reported on 09/12/2016 11/24/13   Dixie Dials, MD  potassium chloride 20 MEQ TBCR Take 20 mEq by mouth daily. 11/29/16   Isla Pence, MD    Family History Family History  Problem Relation Age of Onset  . Heart disease Father   . Colon cancer Sister        age 64    Social History Social History  Substance Use  Topics  . Smoking status: Never Smoker  . Smokeless tobacco: Never Used  . Alcohol use No     Allergies   Fish allergy   Review of Systems Review of Systems  Neurological: Positive for weakness and numbness.  All other systems reviewed and are negative.    Physical Exam Updated Vital Signs BP (!) 175/79 (BP Location: Right Arm)   Pulse 80   Temp 98.3 F (36.8 C) (Oral)   Resp 20   Wt 110 lb (49.9 kg)   SpO2 95%   BMI 18.88 kg/m   Physical Exam  Constitutional: She is oriented to person, place, and time. She appears well-developed and well-nourished.  HENT:  Head: Normocephalic and atraumatic.  Right  Ear: External ear normal.  Left Ear: External ear normal.  Nose: Nose normal.  Mouth/Throat: Oropharynx is clear and moist.  Eyes: Conjunctivae and EOM are normal. Pupils are equal, round, and reactive to light.  Neck: Normal range of motion. Neck supple.  Cardiovascular: Normal rate, regular rhythm, normal heart sounds and intact distal pulses.   Pulmonary/Chest: Effort normal and breath sounds normal.  Abdominal: Soft. Bowel sounds are normal.  Musculoskeletal: Normal range of motion.  Neurological: She is alert and oriented to person, place, and time.  Skin: Skin is warm and dry.  Psychiatric: She has a normal mood and affect. Her behavior is normal. Judgment and thought content normal.  Nursing note and vitals reviewed.    ED Treatments / Results  Labs (all labs ordered are listed, but only abnormal results are displayed) Labs Reviewed  COMPREHENSIVE METABOLIC PANEL - Abnormal; Notable for the following:       Result Value   Potassium 3.0 (*)    ALT 9 (*)    GFR calc non Af Amer 48 (*)    GFR calc Af Amer 56 (*)    All other components within normal limits  CBC WITH DIFFERENTIAL/PLATELET - Abnormal; Notable for the following:    Hemoglobin 11.6 (*)    HCT 35.8 (*)    All other components within normal limits  URINALYSIS, ROUTINE W REFLEX MICROSCOPIC - Abnormal; Notable for the following:    Leukocytes, UA SMALL (*)    Bacteria, UA RARE (*)    Squamous Epithelial / LPF 0-5 (*)    All other components within normal limits  TROPONIN I - Abnormal; Notable for the following:    Troponin I 0.03 (*)    All other components within normal limits    EKG  EKG Interpretation  Date/Time:  Thursday Nov 29 2016 07:44:11 EDT Ventricular Rate:  84 PR Interval:    QRS Duration: 130 QT Interval:  417 QTC Calculation: 493 R Axis:   -28 Text Interpretation:  Sinus rhythm Multiform ventricular premature complexes IVCD, consider atypical RBBB Baseline wander in lead(s) V5 No  significant change since last tracing Confirmed by [facility] MD, Lessie Manigo ([id number]) on 11/29/2016 7:46:37 AM       Radiology Dg Chest 2 View  Result Date: 11/29/2016 CLINICAL DATA:  Dizziness with right hand tingling and decreased grip in the left hand. History of CHF, NSTEMI, hypertension. EXAM: CHEST  2 VIEW COMPARISON:  Chest x-ray of November 07, 2016 FINDINGS: The lungs are mildly hyperinflated with hemidiaphragm flattening. There is no focal infiltrate. There is no pleural effusion or pneumothorax. The heart and pulmonary vascularity are normal. There is calcification in the wall of the aortic arch. The bony thorax exhibits no acute abnormality. IMPRESSION: Hyperinflation consistent with COPD  or reactive airway disease. No pneumonia nor CHF. Thoracic aortic atherosclerosis. Electronically Signed   By: David  Martinique M.D.   On: 11/29/2016 08:28   Ct Head Wo Contrast  Result Date: 11/29/2016 CLINICAL DATA:  83 year old female with right hand weakness and numbness. Denies recent fall or injury. EXAM: CT HEAD WITHOUT CONTRAST CT CERVICAL SPINE WITHOUT CONTRAST TECHNIQUE: Multidetector CT imaging of the head and cervical spine was performed following the standard protocol without intravenous contrast. Multiplanar CT image reconstructions of the cervical spine were also generated. COMPARISON:  Head CT without contrast 11/07/2016 FINDINGS: CT HEAD FINDINGS Brain: Stable cerebral volume. No acute intracranial hemorrhage identified. No midline shift, mass effect, or evidence of intracranial mass lesion. Stable ventricle size and configuration. Stable patchy mostly periventricular white matter hypodensity. No cortically based acute infarct identified. Small area of left occipital pole encephalomalacia is stable. Vascular: Calcified atherosclerosis at the skull base. No suspicious intracranial vascular hyperdensity. Skull: Stable and intact.  No acute osseous abnormality identified. Sinuses/Orbits: Visualized paranasal  sinuses and mastoids are stable and well pneumatized. Other: Stable orbit and scalp soft tissues. CT CERVICAL SPINE FINDINGS Alignment: Straightening of cervical lordosis. 2-3 mm of anterolisthesis of C4 on C5 appears degenerative and is associated with some left side facet hypertrophy. Cervicothoracic junction alignment is within normal limits. Bilateral posterior element alignment is within normal limits. Skull base and vertebrae: Visualized skull base is intact. No atlanto-occipital dissociation. No cervical spine fracture. Soft tissues and spinal canal: No prevertebral fluid or swelling. No visible canal hematoma. Partially retropharyngeal course of both carotid arteries, more so the right. Mild for age calcified carotid atherosclerosis. Simple appearing at least 5 cm right supraclavicular lipoma (series 7, image 50). Disc levels: Minimal for age cervical spine degeneration, primarily at C4-C5 where mild anterolisthesis appears degenerative. No cervical spinal stenosis. Upper chest: Visible upper thoracic levels appear intact. Mild apical pulmonary scarring. IMPRESSION: 1. Stable non contrast CT appearance of the brain. No acute intracranial abnormality. 2. Largely unremarkable for age noncontrast CT appearance of the cervical spine. There is mild degenerative anterolisthesis at C4-C5. No cervical spinal stenosis. 3. Benign right supraclavicular lipoma measuring 5-6 cm. Electronically Signed   By: Genevie Ann M.D.   On: 11/29/2016 09:13   Ct Cervical Spine Wo Contrast  Result Date: 11/29/2016 CLINICAL DATA:  83 year old female with right hand weakness and numbness. Denies recent fall or injury. EXAM: CT HEAD WITHOUT CONTRAST CT CERVICAL SPINE WITHOUT CONTRAST TECHNIQUE: Multidetector CT imaging of the head and cervical spine was performed following the standard protocol without intravenous contrast. Multiplanar CT image reconstructions of the cervical spine were also generated. COMPARISON:  Head CT without  contrast 11/07/2016 FINDINGS: CT HEAD FINDINGS Brain: Stable cerebral volume. No acute intracranial hemorrhage identified. No midline shift, mass effect, or evidence of intracranial mass lesion. Stable ventricle size and configuration. Stable patchy mostly periventricular white matter hypodensity. No cortically based acute infarct identified. Small area of left occipital pole encephalomalacia is stable. Vascular: Calcified atherosclerosis at the skull base. No suspicious intracranial vascular hyperdensity. Skull: Stable and intact.  No acute osseous abnormality identified. Sinuses/Orbits: Visualized paranasal sinuses and mastoids are stable and well pneumatized. Other: Stable orbit and scalp soft tissues. CT CERVICAL SPINE FINDINGS Alignment: Straightening of cervical lordosis. 2-3 mm of anterolisthesis of C4 on C5 appears degenerative and is associated with some left side facet hypertrophy. Cervicothoracic junction alignment is within normal limits. Bilateral posterior element alignment is within normal limits. Skull base and vertebrae: Visualized skull base  is intact. No atlanto-occipital dissociation. No cervical spine fracture. Soft tissues and spinal canal: No prevertebral fluid or swelling. No visible canal hematoma. Partially retropharyngeal course of both carotid arteries, more so the right. Mild for age calcified carotid atherosclerosis. Simple appearing at least 5 cm right supraclavicular lipoma (series 7, image 50). Disc levels: Minimal for age cervical spine degeneration, primarily at C4-C5 where mild anterolisthesis appears degenerative. No cervical spinal stenosis. Upper chest: Visible upper thoracic levels appear intact. Mild apical pulmonary scarring. IMPRESSION: 1. Stable non contrast CT appearance of the brain. No acute intracranial abnormality. 2. Largely unremarkable for age noncontrast CT appearance of the cervical spine. There is mild degenerative anterolisthesis at C4-C5. No cervical spinal  stenosis. 3. Benign right supraclavicular lipoma measuring 5-6 cm. Electronically Signed   By: Genevie Ann M.D.   On: 11/29/2016 09:13    Procedures Procedures (including critical care time)  Medications Ordered in ED Medications - No data to display   Initial Impression / Assessment and Plan / ED Course  I have reviewed the triage vital signs and the nursing notes.  Pertinent labs & imaging results that were available during my care of the patient were reviewed by me and considered in my medical decision making (see chart for details).    Pt has been well since arrival here.  She wants to go home.  She is told to increase the asa up to 325 mg.  She knows to f/u with pcp and with neurology.  Final Clinical Impressions(s) / ED Diagnoses   Final diagnoses:  Hypokalemia  Weakness    New Prescriptions New Prescriptions   POTASSIUM CHLORIDE 20 MEQ TBCR    Take 20 mEq by mouth daily.     Isla Pence, MD 11/29/16 1113

## 2016-11-29 NOTE — ED Notes (Signed)
CRITICAL VALUE ALERT  Critical value received:  Troponin 0.03  Date of notification:  11/29/16  Time of notification:  0828  Critical value read back:Yes.    Nurse who received alert:  Norm Salt, RN  MD notified (1st page):  Dr. Gilford Raid  Time of first page:  0830  MD notified (2nd page):  Time of second page:  Responding MD:  Dr. Gilford Raid   Time MD responded:  3383

## 2016-11-29 NOTE — ED Notes (Signed)
Pt walked with standby nursing assist to the restroom with steady gait. Attempted to obtain urine sample, pt unable to urinate at this time.

## 2016-11-29 NOTE — Discharge Instructions (Signed)
Increase asa to 325 mg daily.

## 2016-11-29 NOTE — ED Triage Notes (Signed)
Pt reports tingling in her right hand with dizziness intermittently since yesterday.  States when she woke up this morning she could not hold her coffee, but denies symptoms currently.

## 2016-11-29 NOTE — ED Notes (Signed)
Pt calling out for water, ok with EDP, ice water given

## 2016-12-04 DIAGNOSIS — Z1389 Encounter for screening for other disorder: Secondary | ICD-10-CM | POA: Diagnosis not present

## 2016-12-04 DIAGNOSIS — E876 Hypokalemia: Secondary | ICD-10-CM | POA: Diagnosis not present

## 2016-12-04 DIAGNOSIS — Z681 Body mass index (BMI) 19 or less, adult: Secondary | ICD-10-CM | POA: Diagnosis not present

## 2016-12-04 DIAGNOSIS — R5383 Other fatigue: Secondary | ICD-10-CM | POA: Diagnosis not present

## 2016-12-26 DIAGNOSIS — Z79899 Other long term (current) drug therapy: Secondary | ICD-10-CM | POA: Diagnosis not present

## 2016-12-26 DIAGNOSIS — E559 Vitamin D deficiency, unspecified: Secondary | ICD-10-CM | POA: Diagnosis not present

## 2016-12-26 DIAGNOSIS — E876 Hypokalemia: Secondary | ICD-10-CM | POA: Diagnosis not present

## 2016-12-26 DIAGNOSIS — J4520 Mild intermittent asthma, uncomplicated: Secondary | ICD-10-CM | POA: Diagnosis not present

## 2016-12-26 DIAGNOSIS — I2510 Atherosclerotic heart disease of native coronary artery without angina pectoris: Secondary | ICD-10-CM | POA: Diagnosis not present

## 2016-12-26 DIAGNOSIS — E785 Hyperlipidemia, unspecified: Secondary | ICD-10-CM | POA: Diagnosis not present

## 2016-12-26 DIAGNOSIS — I5022 Chronic systolic (congestive) heart failure: Secondary | ICD-10-CM | POA: Diagnosis not present

## 2016-12-26 DIAGNOSIS — D649 Anemia, unspecified: Secondary | ICD-10-CM | POA: Diagnosis not present

## 2016-12-26 DIAGNOSIS — I10 Essential (primary) hypertension: Secondary | ICD-10-CM | POA: Diagnosis not present

## 2017-02-11 DIAGNOSIS — Z7982 Long term (current) use of aspirin: Secondary | ICD-10-CM | POA: Diagnosis not present

## 2017-02-11 DIAGNOSIS — I110 Hypertensive heart disease with heart failure: Secondary | ICD-10-CM | POA: Diagnosis not present

## 2017-02-11 DIAGNOSIS — Z6821 Body mass index (BMI) 21.0-21.9, adult: Secondary | ICD-10-CM | POA: Diagnosis not present

## 2017-02-11 DIAGNOSIS — I509 Heart failure, unspecified: Secondary | ICD-10-CM | POA: Diagnosis not present

## 2017-02-11 DIAGNOSIS — E876 Hypokalemia: Secondary | ICD-10-CM | POA: Diagnosis not present

## 2017-05-17 DIAGNOSIS — M5416 Radiculopathy, lumbar region: Secondary | ICD-10-CM | POA: Diagnosis not present

## 2017-05-17 DIAGNOSIS — M5136 Other intervertebral disc degeneration, lumbar region: Secondary | ICD-10-CM | POA: Diagnosis not present

## 2017-07-10 DIAGNOSIS — I2510 Atherosclerotic heart disease of native coronary artery without angina pectoris: Secondary | ICD-10-CM | POA: Diagnosis not present

## 2017-07-10 DIAGNOSIS — I10 Essential (primary) hypertension: Secondary | ICD-10-CM | POA: Diagnosis not present

## 2017-07-10 DIAGNOSIS — I5022 Chronic systolic (congestive) heart failure: Secondary | ICD-10-CM | POA: Diagnosis not present

## 2017-07-10 DIAGNOSIS — J4520 Mild intermittent asthma, uncomplicated: Secondary | ICD-10-CM | POA: Diagnosis not present

## 2017-07-10 DIAGNOSIS — E559 Vitamin D deficiency, unspecified: Secondary | ICD-10-CM | POA: Diagnosis not present

## 2018-01-05 ENCOUNTER — Other Ambulatory Visit: Payer: Self-pay

## 2018-01-05 ENCOUNTER — Emergency Department (HOSPITAL_COMMUNITY)
Admission: EM | Admit: 2018-01-05 | Discharge: 2018-01-05 | Disposition: A | Discharge status: Home / Self Care | Payer: Medicare PPO | Attending: Emergency Medicine | Admitting: Emergency Medicine

## 2018-01-05 ENCOUNTER — Encounter (HOSPITAL_COMMUNITY): Payer: Self-pay | Admitting: Emergency Medicine

## 2018-01-05 DIAGNOSIS — Z79899 Other long term (current) drug therapy: Secondary | ICD-10-CM | POA: Insufficient documentation

## 2018-01-05 DIAGNOSIS — I10 Essential (primary) hypertension: Secondary | ICD-10-CM | POA: Diagnosis not present

## 2018-01-05 DIAGNOSIS — R42 Dizziness and giddiness: Secondary | ICD-10-CM | POA: Diagnosis not present

## 2018-01-05 DIAGNOSIS — I110 Hypertensive heart disease with heart failure: Secondary | ICD-10-CM | POA: Insufficient documentation

## 2018-01-05 DIAGNOSIS — I5020 Unspecified systolic (congestive) heart failure: Secondary | ICD-10-CM | POA: Diagnosis not present

## 2018-01-05 LAB — BASIC METABOLIC PANEL
Anion gap: 8 (ref 5–15)
BUN: 10 mg/dL (ref 6–20)
CO2: 30 mmol/L (ref 22–32)
Calcium: 8.7 mg/dL — ABNORMAL LOW (ref 8.9–10.3)
Chloride: 102 mmol/L (ref 101–111)
Creatinine, Ser: 1 mg/dL (ref 0.44–1.00)
GFR calc Af Amer: 55 mL/min — ABNORMAL LOW (ref >60)
GFR calc non Af Amer: 48 mL/min — ABNORMAL LOW (ref >60)
Glucose, Bld: 88 mg/dL (ref 65–99)
Potassium: 2.8 mmol/L — ABNORMAL LOW (ref 3.5–5.1)
Sodium: 140 mmol/L (ref 135–145)

## 2018-01-05 LAB — CBC
HCT: 33.1 % — ABNORMAL LOW (ref 36.0–46.0)
Hemoglobin: 10.4 g/dL — ABNORMAL LOW (ref 12.0–15.0)
MCH: 28.7 pg (ref 26.0–34.0)
MCHC: 31.4 g/dL (ref 30.0–36.0)
MCV: 91.4 fL (ref 78.0–100.0)
Platelets: 300 10*3/uL (ref 150–400)
RBC: 3.62 MIL/uL — ABNORMAL LOW (ref 3.87–5.11)
RDW: 14.8 % (ref 11.5–15.5)
WBC: 3.6 10*3/uL — ABNORMAL LOW (ref 4.0–10.5)

## 2018-01-05 LAB — CBG MONITORING, ED: Glucose-Capillary: 73 mg/dL (ref 65–99)

## 2018-01-05 MED ORDER — MECLIZINE HCL 25 MG PO TABS: 25.0000 mg | ORAL_TABLET | Freq: Three times a day (TID) | ORAL | 0 refills | Status: DC | PRN

## 2018-01-05 MED ORDER — POTASSIUM CHLORIDE CRYS ER 20 MEQ PO TBCR: 20.0000 meq | EXTENDED_RELEASE_TABLET | Freq: Two times a day (BID) | ORAL | 0 refills | Status: DC

## 2018-01-05 MED ORDER — MECLIZINE HCL 12.5 MG PO TABS
25.0000 mg | ORAL_TABLET | Freq: Once | ORAL | Status: AC
  Administered 2018-01-05: 25 mg via ORAL
  Filled 2018-01-05: qty 2

## 2018-01-05 NOTE — ED Notes (Signed)
ED Provider at bedside. 

## 2018-01-05 NOTE — ED Triage Notes (Addendum)
Patient c/o right side "head pain" and dizziness that started this morning upon waking. Patient states she didn't have any dizziness last night when she went to bed at 10pm. Patient woke this morning at 7am. Denies any slurred speech, facial drooping or weakness. Per patient dizziness, feels like room is spinning, worse with movement.

## 2018-01-10 NOTE — ED Provider Notes (Signed)
[facility] EMERGENCY DEPARTMENT Provider Note   CSN: [id number] Arrival date & time: 01/05/18  0945     History   Chief Complaint Chief Complaint  Patient presents with  . Dizziness    HPI Lynn Weaver is a 84 y.o. female.  HPI   89yF with dizziness. Felt fine when went to bed. When getting out of bed felt very dizzy. Describes feeling off balance and room moving. Improved with rest. Currently no symptoms since being in ED. NO acute pain. NO visual changes. NO numbness, tingling, loss of strength or change in speech per family and pt.   Past Medical History:  Diagnosis Date  . Anemia   . Diverticulosis   . GI bleed    diverticular/hemorrhoidal  . Hemorrhoids   . Hypertension     Patient Active Problem List   Diagnosis Date Noted  . Dysphagia, idiopathic 09/12/2016  . Non-STEMI (non-ST elevated myocardial infarction) ([place]) 11/17/2013  . Acute systolic CHF (congestive heart failure) ([place]) 11/17/2013  . BRBPR (bright red blood per rectum) 06/23/2013  . Hypertension     Past Surgical History:  Procedure Laterality Date  . CATARACT EXTRACTION W/PHACO  05/29/2012   Procedure: CATARACT EXTRACTION PHACO AND INTRAOCULAR LENS PLACEMENT (IOC);  Surgeon: Tonny Branch, MD;  Location: [facility];  Service: Ophthalmology;  Laterality: Right;  CDE:18.72  . CHOLECYSTECTOMY    . COLONOSCOPY N/A 06/24/2013   Significant diverticular disease. Large hemorrhoids. Terminal ileum normal. 2 rectosigmoid polyps (TA) removed. No old or fresh blood noted during exam. Likely had bleeding from diverticula and hemorrhoids. consider flex sig in 3 years.  Marland Kitchen LEFT AND RIGHT HEART CATHETERIZATION WITH CORONARY ANGIOGRAM N/A 11/23/2013   Procedure: LEFT AND RIGHT HEART CATHETERIZATION WITH CORONARY ANGIOGRAM;  Surgeon: Birdie Riddle, MD;  Location: [place] CATH LAB;  Service: Cardiovascular;  Laterality: N/A;     OB History   None      Home Medications    Prior to Admission medications   Medication Sig  Start Date End Date Taking? Authorizing Provider  albuterol (PROVENTIL HFA;VENTOLIN HFA) 108 (90 Base) MCG/ACT inhaler Inhale 1-2 puffs into the lungs every 6 (six) hours as needed for wheezing or shortness of breath.   Yes [provider]  albuterol (PROVENTIL) (2.5 MG/3ML) 0.083% nebulizer solution Take 2.5 mg by nebulization every 6 (six) hours as needed for wheezing or shortness of breath.   Yes [provider]  lisinopril (PRINIVIL,ZESTRIL) 10 MG tablet Take 10 mg by mouth daily.   Yes [provider]  metoprolol tartrate (LOPRESSOR) 12.5 mg TABS tablet Take 0.5 tablets (12.5 mg total) by mouth 2 (two) times daily. 11/24/13  Yes Dixie Dials, MD  meclizine (ANTIVERT) 25 MG tablet Take 1 tablet (25 mg total) by mouth 3 (three) times daily as needed for dizziness. 01/05/18   Virgel Manifold, MD  potassium chloride SA (K-DUR,KLOR-CON) 20 MEQ tablet Take 1 tablet (20 mEq total) by mouth 2 (two) times daily. 01/05/18   Virgel Manifold, MD    Family History Family History  Problem Relation Age of Onset  . Heart disease Father   . Colon cancer Sister        age 57    Social History Social History   Tobacco Use  . Smoking status: Never Smoker  . Smokeless tobacco: Never Used  Substance Use Topics  . Alcohol use: No  . Drug use: No     Allergies   Fish allergy   Review of Systems Review  of Systems  All systems reviewed and negative, other than as noted in HPI.  Physical Exam Updated Vital Signs BP (!) 179/85   Pulse 74   Temp 97.8 F (36.6 C) (Oral)   Resp 19   Ht 5\' 5"  (1.651 m)   Wt 44.5 kg (98 lb)   SpO2 96%   BMI 16.31 kg/m   Physical Exam  Constitutional: She is oriented to person, place, and time. She appears well-developed and well-nourished. No distress.  HENT:  Head: Normocephalic and atraumatic.  Eyes: Pupils are equal, round, and reactive to light. Conjunctivae and EOM are normal. Right eye exhibits no discharge. Left eye exhibits no  discharge.  No nystagmus  Neck: Neck supple.  Cardiovascular: Normal rate, regular rhythm and normal heart sounds. Exam reveals no gallop and no friction rub.  No murmur heard. Pulmonary/Chest: Effort normal and breath sounds normal. No respiratory distress.  Abdominal: Soft. She exhibits no distension. There is no tenderness.  Musculoskeletal: She exhibits no edema or tenderness.  Neurological: She is alert and oriented to person, place, and time. No cranial nerve deficit. She exhibits normal muscle tone. Coordination normal.  Skin: Skin is warm and dry.  Psychiatric: She has a normal mood and affect. Her behavior is normal. Thought content normal.  Nursing note and vitals reviewed.    ED Treatments / Results  Labs (all labs ordered are listed, but only abnormal results are displayed) Labs Reviewed  BASIC METABOLIC PANEL - Abnormal; Notable for the following components:      Result Value   Potassium 2.8 (*)    Calcium 8.7 (*)    GFR calc non Af Amer 48 (*)    GFR calc Af Amer 55 (*)    All other components within normal limits  CBC - Abnormal; Notable for the following components:   WBC 3.6 (*)    RBC 3.62 (*)    Hemoglobin 10.4 (*)    HCT 33.1 (*)    All other components within normal limits  CBG MONITORING, ED    EKG EKG Interpretation  Date/Time:  Sunday January 05 2018 10:10:05 EDT Ventricular Rate:  72 PR Interval:    QRS Duration: 144 QT Interval:  455 QTC Calculation: 498 R Axis:   -60 Text Interpretation:  Sinus rhythm Multiple premature complexes, vent & supraven RBBB and LAFB Confirmed by ,  ([id number]) on 01/05/2018 12:29:35 PM   Radiology No results found.  Procedures Procedures (including critical care time)  Medications Ordered in ED Medications  meclizine (ANTIVERT) tablet 25 mg (25 mg Oral Given 01/05/18 1156)     Initial Impression / Assessment and Plan / ED Course  I have reviewed the triage vital signs and the nursing  notes.  Pertinent labs & imaging results that were available during my care of the patient were reviewed by me and considered in my medical decision making (see chart for details).     89yF with symptoms consistent with peripheral vertigo. I doubt central cause such as cva. Neuro exam nonfocal. PRN meclizine. A few days of potassium as K noted to be low.   Final Clinical Impressions(s) / ED Diagnoses   Final diagnoses:  Vertigo    ED Discharge Orders        Ordered    potassium chloride SA (K-DUR,KLOR-CON) 20 MEQ tablet  2 times daily     01/05/18 1212    meclizine (ANTIVERT) 25 MG tablet  3 times daily PRN     06 /16/19 1215  Virgel Manifold, MD 01/10/18 [phone]

## 2018-01-22 ENCOUNTER — Other Ambulatory Visit: Payer: Self-pay

## 2018-01-22 ENCOUNTER — Emergency Department (HOSPITAL_COMMUNITY)
Admission: EM | Admit: 2018-01-22 | Discharge: 2018-01-22 | Disposition: A | Discharge status: Home / Self Care | Payer: Medicare PPO | Source: Home / Self Care | Attending: Emergency Medicine | Admitting: Emergency Medicine

## 2018-01-22 ENCOUNTER — Emergency Department (HOSPITAL_COMMUNITY): Payer: Medicare PPO

## 2018-01-22 ENCOUNTER — Encounter (HOSPITAL_COMMUNITY): Payer: Self-pay | Admitting: *Deleted

## 2018-01-22 DIAGNOSIS — E876 Hypokalemia: Secondary | ICD-10-CM | POA: Diagnosis present

## 2018-01-22 DIAGNOSIS — Z79899 Other long term (current) drug therapy: Secondary | ICD-10-CM

## 2018-01-22 DIAGNOSIS — I10 Essential (primary) hypertension: Secondary | ICD-10-CM | POA: Diagnosis not present

## 2018-01-22 DIAGNOSIS — I429 Cardiomyopathy, unspecified: Secondary | ICD-10-CM | POA: Diagnosis present

## 2018-01-22 DIAGNOSIS — I63511 Cerebral infarction due to unspecified occlusion or stenosis of right middle cerebral artery: Secondary | ICD-10-CM | POA: Diagnosis present

## 2018-01-22 DIAGNOSIS — R42 Dizziness and giddiness: Secondary | ICD-10-CM | POA: Diagnosis not present

## 2018-01-22 DIAGNOSIS — E861 Hypovolemia: Secondary | ICD-10-CM | POA: Diagnosis present

## 2018-01-22 DIAGNOSIS — K225 Diverticulum of esophagus, acquired: Secondary | ICD-10-CM | POA: Diagnosis present

## 2018-01-22 DIAGNOSIS — I252 Old myocardial infarction: Secondary | ICD-10-CM | POA: Diagnosis not present

## 2018-01-22 DIAGNOSIS — R531 Weakness: Secondary | ICD-10-CM

## 2018-01-22 DIAGNOSIS — I5022 Chronic systolic (congestive) heart failure: Secondary | ICD-10-CM

## 2018-01-22 DIAGNOSIS — G9341 Metabolic encephalopathy: Secondary | ICD-10-CM | POA: Diagnosis present

## 2018-01-22 DIAGNOSIS — J9691 Respiratory failure, unspecified with hypoxia: Secondary | ICD-10-CM | POA: Diagnosis not present

## 2018-01-22 DIAGNOSIS — I480 Paroxysmal atrial fibrillation: Secondary | ICD-10-CM | POA: Diagnosis present

## 2018-01-22 DIAGNOSIS — R638 Other symptoms and signs concerning food and fluid intake: Secondary | ICD-10-CM | POA: Diagnosis not present

## 2018-01-22 DIAGNOSIS — I361 Nonrheumatic tricuspid (valve) insufficiency: Secondary | ICD-10-CM | POA: Diagnosis not present

## 2018-01-22 DIAGNOSIS — I4891 Unspecified atrial fibrillation: Secondary | ICD-10-CM | POA: Diagnosis not present

## 2018-01-22 DIAGNOSIS — Z66 Do not resuscitate: Secondary | ICD-10-CM | POA: Diagnosis present

## 2018-01-22 DIAGNOSIS — E162 Hypoglycemia, unspecified: Secondary | ICD-10-CM | POA: Diagnosis present

## 2018-01-22 DIAGNOSIS — N390 Urinary tract infection, site not specified: Secondary | ICD-10-CM | POA: Diagnosis present

## 2018-01-22 DIAGNOSIS — I2510 Atherosclerotic heart disease of native coronary artery without angina pectoris: Secondary | ICD-10-CM | POA: Diagnosis present

## 2018-01-22 DIAGNOSIS — E860 Dehydration: Secondary | ICD-10-CM

## 2018-01-22 DIAGNOSIS — G8194 Hemiplegia, unspecified affecting left nondominant side: Secondary | ICD-10-CM | POA: Diagnosis present

## 2018-01-22 DIAGNOSIS — J690 Pneumonitis due to inhalation of food and vomit: Secondary | ICD-10-CM | POA: Diagnosis present

## 2018-01-22 DIAGNOSIS — I5042 Chronic combined systolic (congestive) and diastolic (congestive) heart failure: Secondary | ICD-10-CM | POA: Diagnosis present

## 2018-01-22 DIAGNOSIS — I6523 Occlusion and stenosis of bilateral carotid arteries: Secondary | ICD-10-CM | POA: Diagnosis not present

## 2018-01-22 DIAGNOSIS — Z7189 Other specified counseling: Secondary | ICD-10-CM | POA: Diagnosis not present

## 2018-01-22 DIAGNOSIS — Z7401 Bed confinement status: Secondary | ICD-10-CM | POA: Diagnosis not present

## 2018-01-22 DIAGNOSIS — R4020 Unspecified coma: Secondary | ICD-10-CM | POA: Diagnosis not present

## 2018-01-22 DIAGNOSIS — R5383 Other fatigue: Secondary | ICD-10-CM | POA: Diagnosis not present

## 2018-01-22 DIAGNOSIS — I110 Hypertensive heart disease with heart failure: Secondary | ICD-10-CM | POA: Diagnosis present

## 2018-01-22 DIAGNOSIS — I248 Other forms of acute ischemic heart disease: Secondary | ICD-10-CM | POA: Diagnosis present

## 2018-01-22 DIAGNOSIS — R404 Transient alteration of awareness: Secondary | ICD-10-CM | POA: Diagnosis not present

## 2018-01-22 DIAGNOSIS — R0603 Acute respiratory distress: Secondary | ICD-10-CM | POA: Diagnosis not present

## 2018-01-22 DIAGNOSIS — R278 Other lack of coordination: Secondary | ICD-10-CM | POA: Diagnosis not present

## 2018-01-22 DIAGNOSIS — I63411 Cerebral infarction due to embolism of right middle cerebral artery: Secondary | ICD-10-CM | POA: Diagnosis not present

## 2018-01-22 DIAGNOSIS — Z515 Encounter for palliative care: Secondary | ICD-10-CM | POA: Diagnosis not present

## 2018-01-22 DIAGNOSIS — R4182 Altered mental status, unspecified: Secondary | ICD-10-CM | POA: Diagnosis not present

## 2018-01-22 DIAGNOSIS — I69391 Dysphagia following cerebral infarction: Secondary | ICD-10-CM | POA: Diagnosis not present

## 2018-01-22 DIAGNOSIS — I4510 Unspecified right bundle-branch block: Secondary | ICD-10-CM | POA: Diagnosis not present

## 2018-01-22 LAB — URINALYSIS, ROUTINE W REFLEX MICROSCOPIC
BILIRUBIN URINE: NEGATIVE
GLUCOSE, UA: NEGATIVE mg/dL
KETONES UR: 5 mg/dL — AB
Nitrite: NEGATIVE
PROTEIN: 30 mg/dL — AB
Specific Gravity, Urine: 1.016 (ref 1.005–1.030)
pH: 6 (ref 5.0–8.0)

## 2018-01-22 LAB — COMPREHENSIVE METABOLIC PANEL
ALK PHOS: 65 U/L (ref 38–126)
ALT: 20 U/L (ref 0–44)
AST: 40 U/L (ref 15–41)
Albumin: 3.8 g/dL (ref 3.5–5.0)
Anion gap: 11 (ref 5–15)
BUN: 16 mg/dL (ref 8–23)
CHLORIDE: 97 mmol/L — AB (ref 98–111)
CO2: 26 mmol/L (ref 22–32)
CREATININE: 1.2 mg/dL — AB (ref 0.44–1.00)
Calcium: 9.4 mg/dL (ref 8.9–10.3)
GFR, EST AFRICAN AMERICAN: 44 mL/min — AB (ref >60)
GFR, EST NON AFRICAN AMERICAN: 38 mL/min — AB (ref >60)
Glucose, Bld: 95 mg/dL (ref 70–99)
Potassium: 4.2 mmol/L (ref 3.5–5.1)
SODIUM: 134 mmol/L — AB (ref 135–145)
Total Bilirubin: 1.3 mg/dL — ABNORMAL HIGH (ref 0.3–1.2)
Total Protein: 8.2 g/dL — ABNORMAL HIGH (ref 6.5–8.1)

## 2018-01-22 LAB — CBC WITH DIFFERENTIAL/PLATELET
Basophils Absolute: 0 10*3/uL (ref 0.0–0.1)
Basophils Relative: 0 %
EOS ABS: 0 10*3/uL (ref 0.0–0.7)
EOS PCT: 0 %
HCT: 38.5 % (ref 36.0–46.0)
Hemoglobin: 12.8 g/dL (ref 12.0–15.0)
LYMPHS ABS: 1.2 10*3/uL (ref 0.7–4.0)
LYMPHS PCT: 8 %
MCH: 29.4 pg (ref 26.0–34.0)
MCHC: 33.2 g/dL (ref 30.0–36.0)
MCV: 88.5 fL (ref 78.0–100.0)
MONOS PCT: 9 %
Monocytes Absolute: 1.3 10*3/uL — ABNORMAL HIGH (ref 0.1–1.0)
Neutro Abs: 12.4 10*3/uL — ABNORMAL HIGH (ref 1.7–7.7)
Neutrophils Relative %: 83 %
PLATELETS: 337 10*3/uL (ref 150–400)
RBC: 4.35 MIL/uL (ref 3.87–5.11)
RDW: 14.5 % (ref 11.5–15.5)
WBC: 14.9 10*3/uL — ABNORMAL HIGH (ref 4.0–10.5)

## 2018-01-22 LAB — CBG MONITORING, ED: Glucose-Capillary: 110 mg/dL — ABNORMAL HIGH (ref 70–99)

## 2018-01-22 LAB — TROPONIN I: Troponin I: 0.05 ng/mL (ref <0.03)

## 2018-01-22 MED ORDER — CEFTRIAXONE SODIUM 1 G IJ SOLR
1.0000 g | Freq: Once | INTRAMUSCULAR | Status: AC
  Administered 2018-01-22: 1 g via INTRAVENOUS
  Filled 2018-01-22: qty 10

## 2018-01-22 MED ORDER — CEPHALEXIN 500 MG PO CAPS: 500.0000 mg | ORAL_CAPSULE | Freq: Four times a day (QID) | ORAL | 0 refills | Status: DC

## 2018-01-22 MED ORDER — SODIUM CHLORIDE 0.9 % IV SOLN
Freq: Once | INTRAVENOUS | Status: AC
Start: 2018-01-22 — End: 2018-01-22
  Administered 2018-01-22: 15:00:00 via INTRAVENOUS

## 2018-01-22 NOTE — ED Provider Notes (Signed)
5:55 PM Assumed care from Dr. Gilford Raid, please see their note for full history, physical and decision making until this point. In brief this is a 84 y.o. year old female who presented to the ED tonight with Weakness (x 3days )     Decreased intake and increased exposure to that he with evidence of dehydration.  Has already had some fluids and is feeling better just waiting for a urinalysis.  Patient continuing to feel better and is found to have a likely urinary tract infection.  Will start on Keflex after Rocephin in the emergency room.  Culture sent.  Stable for discharge with some help from the family and PCP follow-up in 1 week.  Return here if any symptoms or worsening otherwise follow-up with primary doctor.  Discharge instructions, including strict return precautions for new or worsening symptoms, given. Patient and/or family verbalized understanding and agreement with the plan as described.   Labs, studies and imaging reviewed by myself and considered in medical decision making if ordered. Imaging interpreted by radiology.  Labs Reviewed  CBC WITH DIFFERENTIAL/PLATELET - Abnormal; Notable for the following components:      Result Value   WBC 14.9 (*)    Neutro Abs 12.4 (*)    Monocytes Absolute 1.3 (*)    All other components within normal limits  COMPREHENSIVE METABOLIC PANEL - Abnormal; Notable for the following components:   Sodium 134 (*)    Chloride 97 (*)    Creatinine, Ser 1.20 (*)    Total Protein 8.2 (*)    Total Bilirubin 1.3 (*)    GFR calc non Af Amer 38 (*)    GFR calc Af Amer 44 (*)    All other components within normal limits  URINALYSIS, ROUTINE W REFLEX MICROSCOPIC - Abnormal; Notable for the following components:   APPearance HAZY (*)    Hgb urine dipstick SMALL (*)    Ketones, ur 5 (*)    Protein, ur 30 (*)    Leukocytes, UA MODERATE (*)    Bacteria, UA RARE (*)    All other components within normal limits  TROPONIN I - Abnormal; Notable for the following  components:   Troponin I 0.05 (*)    All other components within normal limits  CBG MONITORING, ED - Abnormal; Notable for the following components:   Glucose-Capillary 110 (*)    All other components within normal limits  URINE CULTURE  I-STAT CG4 LACTIC ACID, ED    CT Head Wo Contrast  Final Result    DG Chest 2 View  Final Result      No follow-ups on file.    Merrily Pew, MD 01/22/18 [phone]

## 2018-01-22 NOTE — ED Triage Notes (Signed)
Patient not eating much, loss of appetite, general weakness, not able to walk as well as usual per family.

## 2018-01-22 NOTE — ED Provider Notes (Signed)
[facility] EMERGENCY DEPARTMENT Provider Note   CSN: [id number] Arrival date & time: 01/22/18  1217     History   Chief Complaint Chief Complaint  Patient presents with  . Weakness    x 3days     HPI Lynn Weaver is a 84 y.o. female.  Pt presents to the ED today with weakness, loss of appetite.  Pt lives by herself, but has family that checks on her frequently.  Her daughter said she has not been eating much and has not been acting herself.  No fevers.  Pt denies any pain.     Past Medical History:  Diagnosis Date  . Anemia   . Diverticulosis   . GI bleed    diverticular/hemorrhoidal  . Hemorrhoids   . Hypertension     Patient Active Problem List   Diagnosis Date Noted  . Dysphagia, idiopathic 09/12/2016  . Non-STEMI (non-ST elevated myocardial infarction) ([place]) 11/17/2013  . Acute systolic CHF (congestive heart failure) ([place]) 11/17/2013  . BRBPR (bright red blood per rectum) 06/23/2013  . Hypertension     Past Surgical History:  Procedure Laterality Date  . CATARACT EXTRACTION W/PHACO  05/29/2012   Procedure: CATARACT EXTRACTION PHACO AND INTRAOCULAR LENS PLACEMENT (IOC);  Surgeon: Tonny Branch, MD;  Location: [facility];  Service: Ophthalmology;  Laterality: Right;  CDE:18.72  . CHOLECYSTECTOMY    . COLONOSCOPY N/A 06/24/2013   Significant diverticular disease. Large hemorrhoids. Terminal ileum normal. 2 rectosigmoid polyps (TA) removed. No old or fresh blood noted during exam. Likely had bleeding from diverticula and hemorrhoids. consider flex sig in 3 years.  Marland Kitchen LEFT AND RIGHT HEART CATHETERIZATION WITH CORONARY ANGIOGRAM N/A 11/23/2013   Procedure: LEFT AND RIGHT HEART CATHETERIZATION WITH CORONARY ANGIOGRAM;  Surgeon: Birdie Riddle, MD;  Location: [place] CATH LAB;  Service: Cardiovascular;  Laterality: N/A;     OB History   None      Home Medications    Prior to Admission medications   Medication Sig Start Date End Date Taking? Authorizing Provider  albuterol  (PROVENTIL HFA;VENTOLIN HFA) 108 (90 Base) MCG/ACT inhaler Inhale 1-2 puffs into the lungs every 6 (six) hours as needed for wheezing or shortness of breath.   Yes [provider]  lisinopril (PRINIVIL,ZESTRIL) 10 MG tablet Take 10 mg by mouth daily.   Yes [provider]  meclizine (ANTIVERT) 25 MG tablet Take 1 tablet (25 mg total) by mouth 3 (three) times daily as needed for dizziness. 01/05/18  Yes Virgel Manifold, MD  metoprolol tartrate (LOPRESSOR) 12.5 mg TABS tablet Take 0.5 tablets (12.5 mg total) by mouth 2 (two) times daily. 11/24/13  Yes Dixie Dials, MD  potassium chloride SA (K-DUR,KLOR-CON) 20 MEQ tablet Take 1 tablet (20 mEq total) by mouth 2 (two) times daily. 01/05/18  Yes Virgel Manifold, MD  albuterol (PROVENTIL) (2.5 MG/3ML) 0.083% nebulizer solution Take 2.5 mg by nebulization every 6 (six) hours as needed for wheezing or shortness of breath.    [provider]    Family History Family History  Problem Relation Age of Onset  . Heart disease Father   . Colon cancer Sister        age 64    Social History Social History   Tobacco Use  . Smoking status: Never Smoker  . Smokeless tobacco: Never Used  Substance Use Topics  . Alcohol use: No  . Drug use: No     Allergies   Fish allergy   Review of Systems Review of  Systems  Constitutional: Positive for activity change and fatigue.  Neurological: Positive for weakness.     Physical Exam Updated Vital Signs BP (!) 148/80 (BP Location: Left Arm)   Pulse 79   Temp 98.8 F (37.1 C) (Oral)   Resp 18   Ht 5\' 5"  (1.651 m)   Wt 44.5 kg (98 lb)   SpO2 98%   BMI 16.31 kg/m   Physical Exam  Constitutional: She is oriented to person, place, and time. She appears well-developed and well-nourished.  HENT:  Head: Normocephalic and atraumatic.  Right Ear: External ear normal.  Left Ear: External ear normal.  Nose: Nose normal.  Mouth/Throat: Oropharynx is clear and moist.  Eyes: Pupils  are equal, round, and reactive to light. Conjunctivae and EOM are normal.  Neck: Normal range of motion. Neck supple.  Cardiovascular: Normal rate, regular rhythm, normal heart sounds and intact distal pulses.  Pulmonary/Chest: Effort normal and breath sounds normal.  Abdominal: Soft. Bowel sounds are normal.  Musculoskeletal: Normal range of motion.  Neurological: She is alert and oriented to person, place, and time.  Skin: Skin is warm. Capillary refill takes less than 2 seconds.  Psychiatric: She has a normal mood and affect. Her behavior is normal. Judgment and thought content normal.  Nursing note and vitals reviewed.    ED Treatments / Results  Labs (all labs ordered are listed, but only abnormal results are displayed) Labs Reviewed  CBC WITH DIFFERENTIAL/PLATELET - Abnormal; Notable for the following components:      Result Value   WBC 14.9 (*)    Neutro Abs 12.4 (*)    Monocytes Absolute 1.3 (*)    All other components within normal limits  COMPREHENSIVE METABOLIC PANEL - Abnormal; Notable for the following components:   Sodium 134 (*)    Chloride 97 (*)    Creatinine, Ser 1.20 (*)    Total Protein 8.2 (*)    Total Bilirubin 1.3 (*)    GFR calc non Af Amer 38 (*)    GFR calc Af Amer 44 (*)    All other components within normal limits  TROPONIN I - Abnormal; Notable for the following components:   Troponin I 0.05 (*)    All other components within normal limits  CBG MONITORING, ED - Abnormal; Notable for the following components:   Glucose-Capillary 110 (*)    All other components within normal limits  URINALYSIS, ROUTINE W REFLEX MICROSCOPIC  I-STAT CG4 LACTIC ACID, ED    EKG None  Radiology Dg Chest 2 View  Result Date: 01/22/2018 CLINICAL DATA:  Loss of appetite, altered level of consciousness EXAM: CHEST - 2 VIEW COMPARISON:  11/29/2016 FINDINGS: The heart size and mediastinal contours are within normal limits. Both lungs are clear. The visualized skeletal  structures are unremarkable. IMPRESSION: No active cardiopulmonary disease. Electronically Signed   By: Kathreen Devoid   On: 01/22/2018 13:33   Ct Head Wo Contrast  Result Date: 01/22/2018 CLINICAL DATA:  Altered mental status, dizziness and generalized weakness EXAM: CT HEAD WITHOUT CONTRAST TECHNIQUE: Contiguous axial images were obtained from the base of the skull through the vertex without intravenous contrast. COMPARISON:  Head CT 11/29/2016 FINDINGS: Brain: There is no mass, hemorrhage or extra-axial collection. There is generalized atrophy without lobar predilection. There is no acute or chronic infarction. There is hypoattenuation of the periventricular white matter, most commonly indicating chronic ischemic microangiopathy. Vascular: No abnormal hyperdensity of the major intracranial arteries or dural venous sinuses. No intracranial atherosclerosis.  Skull: The visualized skull base, calvarium and extracranial soft tissues are normal. Sinuses/Orbits: No fluid levels or advanced mucosal thickening of the visualized paranasal sinuses. No mastoid or middle ear effusion. The orbits are normal. IMPRESSION: Generalized atrophy and chronic small vessel disease without acute intracranial abnormality. Electronically Signed   By: Ulyses Jarred M.D.   On: 01/22/2018 14:15    Procedures Procedures (including critical care time)  Medications Ordered in ED Medications  0.9 %  sodium chloride infusion ( Intravenous New Bag/Given 01/22/18 1451)     Initial Impression / Assessment and Plan / ED Course  I have reviewed the triage vital signs and the nursing notes.  Pertinent labs & imaging results that were available during my care of the patient were reviewed by me and considered in my medical decision making (see chart for details).    Pt is feeling much better after IVFs.  Her family said she's been sitting outside on her porch in the evenings.  (it has been 90 + degrees).  She is encouraged to drink a lot  of fluid if she sits in the heat.   Mild elevation in troponin is chronic.  UA pending.  Pt to be signed out to Dr. Dolly Rias.  I anticipate d/c.   Final Clinical Impressions(s) / ED Diagnoses   Final diagnoses:  Dehydration    ED Discharge Orders    None       Isla Pence, MD 01/22/18 [phone]

## 2018-01-22 NOTE — ED Notes (Signed)
Patient transported to X-ray 

## 2018-01-23 ENCOUNTER — Encounter (HOSPITAL_COMMUNITY): Payer: Self-pay | Admitting: Emergency Medicine

## 2018-01-23 ENCOUNTER — Other Ambulatory Visit: Payer: Self-pay

## 2018-01-23 ENCOUNTER — Inpatient Hospital Stay (HOSPITAL_COMMUNITY)
Admission: EM | Admit: 2018-01-23 | Discharge: 2018-01-27 | DRG: 064 | Disposition: A | Discharge status: Skilled Nursing Facility | Payer: Medicare PPO | Attending: Internal Medicine | Admitting: Internal Medicine

## 2018-01-23 ENCOUNTER — Inpatient Hospital Stay (HOSPITAL_COMMUNITY): Payer: Medicare PPO

## 2018-01-23 DIAGNOSIS — E860 Dehydration: Secondary | ICD-10-CM

## 2018-01-23 DIAGNOSIS — J690 Pneumonitis due to inhalation of food and vomit: Secondary | ICD-10-CM

## 2018-01-23 DIAGNOSIS — I252 Old myocardial infarction: Secondary | ICD-10-CM | POA: Diagnosis not present

## 2018-01-23 DIAGNOSIS — I429 Cardiomyopathy, unspecified: Secondary | ICD-10-CM | POA: Diagnosis present

## 2018-01-23 DIAGNOSIS — I480 Paroxysmal atrial fibrillation: Secondary | ICD-10-CM | POA: Diagnosis present

## 2018-01-23 DIAGNOSIS — K225 Diverticulum of esophagus, acquired: Secondary | ICD-10-CM | POA: Diagnosis present

## 2018-01-23 DIAGNOSIS — G8194 Hemiplegia, unspecified affecting left nondominant side: Secondary | ICD-10-CM

## 2018-01-23 DIAGNOSIS — I63511 Cerebral infarction due to unspecified occlusion or stenosis of right middle cerebral artery: Secondary | ICD-10-CM | POA: Diagnosis present

## 2018-01-23 DIAGNOSIS — I248 Other forms of acute ischemic heart disease: Secondary | ICD-10-CM | POA: Diagnosis present

## 2018-01-23 DIAGNOSIS — G9341 Metabolic encephalopathy: Secondary | ICD-10-CM | POA: Diagnosis not present

## 2018-01-23 DIAGNOSIS — I2510 Atherosclerotic heart disease of native coronary artery without angina pectoris: Secondary | ICD-10-CM | POA: Diagnosis present

## 2018-01-23 DIAGNOSIS — E162 Hypoglycemia, unspecified: Secondary | ICD-10-CM | POA: Diagnosis present

## 2018-01-23 DIAGNOSIS — Z66 Do not resuscitate: Secondary | ICD-10-CM | POA: Diagnosis present

## 2018-01-23 DIAGNOSIS — I5042 Chronic combined systolic (congestive) and diastolic (congestive) heart failure: Secondary | ICD-10-CM | POA: Diagnosis present

## 2018-01-23 DIAGNOSIS — Z7189 Other specified counseling: Secondary | ICD-10-CM

## 2018-01-23 DIAGNOSIS — I4891 Unspecified atrial fibrillation: Secondary | ICD-10-CM | POA: Diagnosis not present

## 2018-01-23 DIAGNOSIS — N390 Urinary tract infection, site not specified: Secondary | ICD-10-CM | POA: Diagnosis present

## 2018-01-23 DIAGNOSIS — R531 Weakness: Secondary | ICD-10-CM

## 2018-01-23 DIAGNOSIS — E861 Hypovolemia: Secondary | ICD-10-CM | POA: Diagnosis present

## 2018-01-23 DIAGNOSIS — I110 Hypertensive heart disease with heart failure: Secondary | ICD-10-CM | POA: Diagnosis present

## 2018-01-23 DIAGNOSIS — I361 Nonrheumatic tricuspid (valve) insufficiency: Secondary | ICD-10-CM

## 2018-01-23 DIAGNOSIS — Z515 Encounter for palliative care: Secondary | ICD-10-CM | POA: Diagnosis not present

## 2018-01-23 DIAGNOSIS — E876 Hypokalemia: Secondary | ICD-10-CM | POA: Diagnosis present

## 2018-01-23 DIAGNOSIS — I639 Cerebral infarction, unspecified: Secondary | ICD-10-CM

## 2018-01-23 DIAGNOSIS — I5022 Chronic systolic (congestive) heart failure: Secondary | ICD-10-CM | POA: Insufficient documentation

## 2018-01-23 DIAGNOSIS — R0603 Acute respiratory distress: Secondary | ICD-10-CM

## 2018-01-23 LAB — URINALYSIS, ROUTINE W REFLEX MICROSCOPIC
BILIRUBIN URINE: NEGATIVE
GLUCOSE, UA: NEGATIVE mg/dL
KETONES UR: 5 mg/dL — AB
LEUKOCYTES UA: NEGATIVE
NITRITE: NEGATIVE
PROTEIN: NEGATIVE mg/dL
Specific Gravity, Urine: 1.016 (ref 1.005–1.030)
pH: 5 (ref 5.0–8.0)

## 2018-01-23 LAB — BASIC METABOLIC PANEL
ANION GAP: 9 (ref 5–15)
BUN: 15 mg/dL (ref 8–23)
CALCIUM: 8.9 mg/dL (ref 8.9–10.3)
CO2: 25 mmol/L (ref 22–32)
Chloride: 102 mmol/L (ref 98–111)
Creatinine, Ser: 1 mg/dL (ref 0.44–1.00)
GFR calc non Af Amer: 48 mL/min — ABNORMAL LOW (ref >60)
GFR, EST AFRICAN AMERICAN: 55 mL/min — AB (ref >60)
Glucose, Bld: 85 mg/dL (ref 70–99)
Potassium: 4.4 mmol/L (ref 3.5–5.1)
SODIUM: 136 mmol/L (ref 135–145)

## 2018-01-23 LAB — CBC WITH DIFFERENTIAL/PLATELET
BASOS PCT: 0 %
Basophils Absolute: 0 10*3/uL (ref 0.0–0.1)
EOS ABS: 0 10*3/uL (ref 0.0–0.7)
Eosinophils Relative: 0 %
HEMATOCRIT: 35 % — AB (ref 36.0–46.0)
HEMOGLOBIN: 11.7 g/dL — AB (ref 12.0–15.0)
Lymphocytes Relative: 9 %
Lymphs Abs: 1.1 10*3/uL (ref 0.7–4.0)
MCH: 29.7 pg (ref 26.0–34.0)
MCHC: 33.4 g/dL (ref 30.0–36.0)
MCV: 88.8 fL (ref 78.0–100.0)
MONOS PCT: 10 %
Monocytes Absolute: 1.2 10*3/uL — ABNORMAL HIGH (ref 0.1–1.0)
NEUTROS ABS: 9.9 10*3/uL — AB (ref 1.7–7.7)
NEUTROS PCT: 81 %
Platelets: 287 10*3/uL (ref 150–400)
RBC: 3.94 MIL/uL (ref 3.87–5.11)
RDW: 14.5 % (ref 11.5–15.5)
WBC: 12.2 10*3/uL — AB (ref 4.0–10.5)

## 2018-01-23 LAB — ECHOCARDIOGRAM COMPLETE
Height: 65 in
Weight: 1568 oz

## 2018-01-23 LAB — TROPONIN I
TROPONIN I: 0.06 ng/mL — AB (ref <0.03)
Troponin I: 0.06 ng/mL (ref <0.03)
Troponin I: 0.07 ng/mL (ref <0.03)

## 2018-01-23 LAB — I-STAT CG4 LACTIC ACID, ED: LACTIC ACID, VENOUS: 1.1 mmol/L (ref 0.5–1.9)

## 2018-01-23 LAB — AMMONIA: Ammonia: 10 umol/L (ref 9–35)

## 2018-01-23 LAB — FOLATE: FOLATE: 9.4 ng/mL (ref >5.9)

## 2018-01-23 LAB — TSH: TSH: 1.885 u[IU]/mL (ref 0.350–4.500)

## 2018-01-23 MED ORDER — ACETAMINOPHEN 650 MG RE SUPP: 650.0000 mg | Freq: Four times a day (QID) | RECTAL | Status: DC | PRN

## 2018-01-23 MED ORDER — SODIUM CHLORIDE 0.9 % IV SOLN
Freq: Once | INTRAVENOUS | Status: AC
  Administered 2018-01-23: 10:00:00 via INTRAVENOUS

## 2018-01-23 MED ORDER — ENOXAPARIN SODIUM 40 MG/0.4ML ~~LOC~~ SOLN: 40.0000 mg | SUBCUTANEOUS | Status: DC

## 2018-01-23 MED ORDER — ONDANSETRON HCL 4 MG/2ML IJ SOLN: 4.0000 mg | Freq: Four times a day (QID) | INTRAMUSCULAR | Status: DC | PRN

## 2018-01-23 MED ORDER — ACETAMINOPHEN 325 MG PO TABS: 650.0000 mg | ORAL_TABLET | Freq: Four times a day (QID) | ORAL | Status: DC | PRN

## 2018-01-23 MED ORDER — HYDRALAZINE HCL 20 MG/ML IJ SOLN
5.0000 mg | Freq: Four times a day (QID) | INTRAMUSCULAR | Status: DC | PRN
  Administered 2018-01-27: 5 mg via INTRAVENOUS
  Filled 2018-01-23: qty 1

## 2018-01-23 MED ORDER — ENOXAPARIN SODIUM 30 MG/0.3ML ~~LOC~~ SOLN
30.0000 mg | SUBCUTANEOUS | Status: DC
  Administered 2018-01-23: 30 mg via SUBCUTANEOUS
  Filled 2018-01-23: qty 0.3

## 2018-01-23 MED ORDER — SODIUM CHLORIDE 0.9 % IV SOLN
3.0000 g | Freq: Two times a day (BID) | INTRAVENOUS | Status: DC
  Administered 2018-01-23 – 2018-01-24 (×2): 3 g via INTRAVENOUS
  Filled 2018-01-23 (×3): qty 3

## 2018-01-23 MED ORDER — SODIUM CHLORIDE 0.9 % IV SOLN
1.0000 g | INTRAVENOUS | Status: DC
Start: 2018-01-23 — End: 2018-01-24
  Administered 2018-01-23: 1 g via INTRAVENOUS
  Filled 2018-01-23: qty 10

## 2018-01-23 MED ORDER — SODIUM CHLORIDE 0.9 % IV SOLN
INTRAVENOUS | Status: DC
Start: 2018-01-23 — End: 2018-01-24
  Administered 2018-01-23 – 2018-01-24 (×2): via INTRAVENOUS

## 2018-01-23 MED ORDER — ONDANSETRON HCL 4 MG PO TABS: 4.0000 mg | ORAL_TABLET | Freq: Four times a day (QID) | ORAL | Status: DC | PRN

## 2018-01-23 NOTE — ED Triage Notes (Signed)
PT brought in by [facility] due to continued difficulty walking and doing her own ADLs since last ED visit with dx of UTI. PT lives by herself and family stayed with her last night and called EMS this morning due to continued generalized weakness.

## 2018-01-23 NOTE — ED Notes (Signed)
Date and time results received: 01/23/18 10:58 AM    Test: troponin Critical Value: 0.06  Name of Provider Notified: Tammy Triplett  Orders Received?  No new orders

## 2018-01-23 NOTE — ED Provider Notes (Signed)
Medical screening examination/treatment/procedure(s) were conducted as a shared visit with non-physician practitioner(s) and myself.  I personally evaluated the patient during the encounter.  Clinical Impression:   Final diagnoses:  Generalized weakness  Dehydration    Elderly patient, recently diagnosed with urinary tract infection, lives by herself, unable to live by herself recently as she has significant difficulty with doing even the simplest of activities of daily living, this is all reported from family who unfortunately are not here at this time.  On exam she is in no distress, has a soft abdomen, clear heart and lung sounds, borderline tachycardia, no edema.  Labs, urinalysis, may need admission due to poor functional status and need for placement.   Lynn Chapel, MD 01/31/18 [phone]

## 2018-01-23 NOTE — Progress Notes (Signed)
CRITICAL VALUE ALERT  Critical Value:  Troponin 0.07  Date & Time Notied:  01/23/18 2034  Provider Notified: Dr. Maudie Mercury  Orders Received/Actions taken: Monitor

## 2018-01-23 NOTE — Progress Notes (Signed)
*  PRELIMINARY RESULTS* Echocardiogram 2D Echocardiogram has been performed.  Lynn Weaver 01/23/2018, 4:39 PM

## 2018-01-23 NOTE — H&P (Signed)
History and Physical  Lynn Weaver FHL:[id number] DOB: 1925/02/15 DOA: 01/23/2018   PCP: Redmond School, MD   Patient coming from: Home  Chief Complaint: generalized weakness  HPI:  Lynn Weaver is a 84 y.o. female with medical history of cognitive impairment, hypertension, diverticulosis, systolic CHF, coronary artery disease presenting with 4-day history of generalized weakness.  At baseline, the patient is independent with all her ADLs although the patient's niece states that she does have some memory problems.  In the past month, her niece has noted that the patient has had increasing gait instability.  However, because of her worsening generalized weakness, the patient was brought to emergency department on 01/22/2018.  Patient improved clinically after IV fluids in the emergency department.  She was sent home with cephalexin for treatment of UTI.  However, the patient continued to remain somnolent with worsening generalized weakness to the point she was not able to get out of bed on the morning of 01/23/2018.  As result, the patient was brought back to the emergency department for further evaluation.  Because of her somnolence and generalized weakness, the patient has had decreased oral intake.  There have been no reports of headaches, chest pain, dysuria, hematuria.  However, the patient's niece states that she has complained of intermittent shortness of breath.  In addition, the patient's niece has also noted that the patient has been dragging her left leg more than usual in the past day or 2.  Unfortunately, the patient is unable to provide any history secondary to recurrent encephalopathy.  In the emergency department, the patient was afebrile hemodynamically stable saturating 99% on room air.  WBC was 12.2 with hemoglobin 11.7.  BMP showed serum creatinine 1.00.  Otherwise electro lites were unremarkable.  Troponin was 0.06.  Urinalysis was negative for pyuria.  Chest x-ray was negative for  any acute findings.  CT of the brain was negative for acute findings.  Assessment/Plan: Acute metabolic encephalopathy -Concerned about possible stroke with left hemiparesis -Likely multifactorial including UTI and dehydration -Serum B12 -Check ammonia -Folic acid -TSH  Aspiration pneumonitis -The patient was noted to have a retained pill in her posterior oropharynx -Speech therapy evaluation -Start Unasyn  Dehydration -The patient is hemoconcentrated -Continue IV fluids  Pyuria -Start ceftriaxone pending urine culture data  Left hemiparesis -PT evaluation -Speech therapy eval -CT brain-- -MRI brain-- -Carotid Duplex-- -Echo-- -LDL-- -HbA1C-- -Antiplatelet--ASA supp  Essential Hypertension -allowing for permissive HTN -holding metoprolol and lisinopril  Elevated troponin -appears chronic likely due to cardiomyopathy with EF 20-25% -repeat echo -cycle troponins  Chronic systolic and diastolic CHF -Appears hypovolemic presently -Daily weights -11/18/2013 echo EF 20-25%, grade 1 DD     Past Medical History:  Diagnosis Date  . Anemia   . Diverticulosis   . GI bleed    diverticular/hemorrhoidal  . Hemorrhoids   . Hypertension    Past Surgical History:  Procedure Laterality Date  . CATARACT EXTRACTION W/PHACO  05/29/2012   Procedure: CATARACT EXTRACTION PHACO AND INTRAOCULAR LENS PLACEMENT (IOC);  Surgeon: Tonny Branch, MD;  Location: [facility];  Service: Ophthalmology;  Laterality: Right;  CDE:18.72  . CHOLECYSTECTOMY    . COLONOSCOPY N/A 06/24/2013   Significant diverticular disease. Large hemorrhoids. Terminal ileum normal. 2 rectosigmoid polyps (TA) removed. No old or fresh blood noted during exam. Likely had bleeding from diverticula and hemorrhoids. consider flex sig in 3 years.  Marland Kitchen LEFT AND RIGHT HEART CATHETERIZATION WITH CORONARY ANGIOGRAM N/A 11/23/2013  Procedure: LEFT AND RIGHT HEART CATHETERIZATION WITH CORONARY ANGIOGRAM;  Surgeon: Birdie Riddle, MD;   Location: [place] CATH LAB;  Service: Cardiovascular;  Laterality: N/A;   Social History:  reports that she has never smoked. She has never used smokeless tobacco. She reports that she does not drink alcohol or use drugs.   Family History  Problem Relation Age of Onset  . Heart disease Father   . Colon cancer Sister        age 8     Allergies  Allergen Reactions  . Fish Allergy Hives     Prior to Admission medications   Medication Sig Start Date End Date Taking? Authorizing Provider  albuterol (PROVENTIL HFA;VENTOLIN HFA) 108 (90 Base) MCG/ACT inhaler Inhale 1-2 puffs into the lungs every 6 (six) hours as needed for wheezing or shortness of breath.   Yes [provider]  albuterol (PROVENTIL) (2.5 MG/3ML) 0.083% nebulizer solution Take 2.5 mg by nebulization every 6 (six) hours as needed for wheezing or shortness of breath.   Yes [provider]  lisinopril (PRINIVIL,ZESTRIL) 10 MG tablet Take 10 mg by mouth daily.   Yes [provider]  meclizine (ANTIVERT) 25 MG tablet Take 1 tablet (25 mg total) by mouth 3 (three) times daily as needed for dizziness. 01/05/18  Yes Virgel Manifold, MD  metoprolol tartrate (LOPRESSOR) 12.5 mg TABS tablet Take 0.5 tablets (12.5 mg total) by mouth 2 (two) times daily. 11/24/13  Yes Dixie Dials, MD  potassium chloride SA (K-DUR,KLOR-CON) 20 MEQ tablet Take 1 tablet (20 mEq total) by mouth 2 (two) times daily. 01/05/18  Yes Virgel Manifold, MD  cephALEXin (KEFLEX) 500 MG capsule Take 1 capsule (500 mg total) by mouth 4 (four) times daily. 01/22/18   Mesner, Corene Cornea, MD    Review of Systems:  Unobtainable secondary to patient's encephalopathy Physical Exam: Vitals:   01/23/18 1030 01/23/18 1100 01/23/18 1130 01/23/18 1200  BP: (!) 175/85 (!) 149/51 130/72 (!) 160/64  Pulse: 88 69 72 90  Resp: 13 14 15 17   Temp:      TempSrc:      SpO2: 94% 94% 93% 91%  Weight:      Height:       General:  A&O x 1, NAD, nontoxic,  pleasant/cooperative Head/Eye: No conjunctival hemorrhage, no icterus, [place]/AT, No nystagmus ENT:  No icterus,  No thrush, good dentition, no pharyngeal exudate Neck:  No masses, no lymphadenpathy, no bruits CV:  RRR, no rub, no gallop, no S3 Lung: Poor respiratory effort with diminished breath sounds.  Bibasilar crackles but no wheezing.  Abdomen. Abdomen: soft/NT, +BS, nondistended, no peritoneal signs Ext: No cyanosis, No rashes, No petechiae, No lymphangitis, No edema Neuro: CNII-XII intact, strength 4-/5 in RUE, RLE; 3/5 LUE, LLE   Labs on Admission:  Basic Metabolic Panel: Recent Labs  Lab 01/22/18 1419 01/23/18 0934  NA 134* 136  K 4.2 4.4  CL 97* 102  CO2 26 25  GLUCOSE 95 85  BUN 16 15  CREATININE 1.20* 1.00  CALCIUM 9.4 8.9   Liver Function Tests: Recent Labs  Lab 01/22/18 1419  AST 40  ALT 20  ALKPHOS 65  BILITOT 1.3*  PROT 8.2*  ALBUMIN 3.8   No results for input(s): LIPASE, AMYLASE in the last 168 hours. No results for input(s): AMMONIA in the last 168 hours. CBC: Recent Labs  Lab 01/22/18 1419 01/23/18 0934  WBC 14.9* 12.2*  NEUTROABS 12.4* 9.9*  HGB 12.8 11.7*  HCT 38.5 35.0*  MCV 88.5 88.8  PLT 337 287   Coagulation Profile: No results for input(s): INR, PROTIME in the last 168 hours. Cardiac Enzymes: Recent Labs  Lab 01/22/18 1419 01/23/18 0934  TROPONINI 0.05* 0.06*   BNP: Invalid input(s): POCBNP CBG: Recent Labs  Lab 01/22/18 1305  GLUCAP 110*   Urine analysis:    Component Value Date/Time   COLORURINE AMBER (A) 01/23/2018 1001   APPEARANCEUR CLEAR 01/23/2018 1001   LABSPEC 1.016 01/23/2018 1001   PHURINE 5.0 01/23/2018 1001   GLUCOSEU NEGATIVE 01/23/2018 1001   HGBUR SMALL (A) 01/23/2018 1001   BILIRUBINUR NEGATIVE 01/23/2018 1001   KETONESUR 5 (A) 01/23/2018 1001   PROTEINUR NEGATIVE 01/23/2018 1001   UROBILINOGEN 0.2 11/17/2013 1123   NITRITE NEGATIVE 01/23/2018 1001   LEUKOCYTESUR NEGATIVE 01/23/2018 1001    Sepsis Labs: @LABRCNTIP (procalcitonin:4,lacticidven:4) )No results found for this or any previous visit (from the past 240 hour(s)).   Radiological Exams on Admission: Dg Chest 2 View  Result Date: 01/22/2018 CLINICAL DATA:  Loss of appetite, altered level of consciousness EXAM: CHEST - 2 VIEW COMPARISON:  11/29/2016 FINDINGS: The heart size and mediastinal contours are within normal limits. Both lungs are clear. The visualized skeletal structures are unremarkable. IMPRESSION: No active cardiopulmonary disease. Electronically Signed   By: Kathreen Devoid   On: 01/22/2018 13:33   Ct Head Wo Contrast  Result Date: 01/22/2018 CLINICAL DATA:  Altered mental status, dizziness and generalized weakness EXAM: CT HEAD WITHOUT CONTRAST TECHNIQUE: Contiguous axial images were obtained from the base of the skull through the vertex without intravenous contrast. COMPARISON:  Head CT 11/29/2016 FINDINGS: Brain: There is no mass, hemorrhage or extra-axial collection. There is generalized atrophy without lobar predilection. There is no acute or chronic infarction. There is hypoattenuation of the periventricular white matter, most commonly indicating chronic ischemic microangiopathy. Vascular: No abnormal hyperdensity of the major intracranial arteries or dural venous sinuses. No intracranial atherosclerosis. Skull: The visualized skull base, calvarium and extracranial soft tissues are normal. Sinuses/Orbits: No fluid levels or advanced mucosal thickening of the visualized paranasal sinuses. No mastoid or middle ear effusion. The orbits are normal. IMPRESSION: Generalized atrophy and chronic small vessel disease without acute intracranial abnormality. Electronically Signed   By: Ulyses Jarred M.D.   On: 01/22/2018 14:15    EKG: Independently reviewed. Sinus RBBB    Time spent:70 minutes Code Status:   DNR Family Communication:  Niece updated at bedside Disposition Plan: expect 2-3 day hospitalization Consults  called: none DVT Prophylaxis: [place] Lovenox  Orson Eva, DO  [facility] Pager [phone]  If 7PM-7AM, please contact night-coverage [email or web address] Password [email or web address] 01/23/2018, 12:17 PM

## 2018-01-23 NOTE — ED Provider Notes (Signed)
[facility] Provider Note   CSN: [id number] Arrival date & time: 01/23/18  0830     History   Chief Complaint Chief Complaint  Patient presents with  . Weakness    HPI [facility] is a 84 y.o. female.  HPI   [facility] is a 84 y.o. female returns to the Emergency Department complaining of continued generalized weakness and unable to care for herself at home.  She was determined to have a UTI yesterday during her ER stay, she was given IV rocephin, rx for Kelfex and discharged home.  She states she normally lives at home by herself, family stayed with her last night.  She returns this morning stating that she wants to be admitted.  She denies abdominal pain, vomiting, chest pain and shortness of breath.  Patient's niece, who is also her caregiver, states she is very weak and could not stand on her own this morning and has not eat or drink anything.  She has not taken any of her daytime medications.   Past Medical History:  Diagnosis Date  . Anemia   . Diverticulosis   . GI bleed    diverticular/hemorrhoidal  . Hemorrhoids   . Hypertension     Patient Active Problem List   Diagnosis Date Noted  . Dysphagia, idiopathic 09/12/2016  . Non-STEMI (non-ST elevated myocardial infarction) ([place]) 11/17/2013  . Acute systolic CHF (congestive heart failure) ([place]) 11/17/2013  . BRBPR (bright red blood per rectum) 06/23/2013  . Hypertension     Past Surgical History:  Procedure Laterality Date  . CATARACT EXTRACTION W/PHACO  05/29/2012   Procedure: CATARACT EXTRACTION PHACO AND INTRAOCULAR LENS PLACEMENT (IOC);  Surgeon: Tonny Branch, MD;  Location: [facility];  Service: Ophthalmology;  Laterality: Right;  CDE:18.72  . CHOLECYSTECTOMY    . COLONOSCOPY N/A 06/24/2013   Significant diverticular disease. Large hemorrhoids. Terminal ileum normal. 2 rectosigmoid polyps (TA) removed. No old or fresh blood noted during exam. Likely had bleeding from diverticula and  hemorrhoids. consider flex sig in 3 years.  Marland Kitchen LEFT AND RIGHT HEART CATHETERIZATION WITH CORONARY ANGIOGRAM N/A 11/23/2013   Procedure: LEFT AND RIGHT HEART CATHETERIZATION WITH CORONARY ANGIOGRAM;  Surgeon: Birdie Riddle, MD;  Location: [facility];  Service: Cardiovascular;  Laterality: N/A;     OB History   None      Home Medications    Prior to Admission medications   Medication Sig Start Date End Date Taking? Authorizing Provider  albuterol (PROVENTIL HFA;VENTOLIN HFA) 108 (90 Base) MCG/ACT inhaler Inhale 1-2 puffs into the lungs every 6 (six) hours as needed for wheezing or shortness of breath.    [provider]  albuterol (PROVENTIL) (2.5 MG/3ML) 0.083% nebulizer solution Take 2.5 mg by nebulization every 6 (six) hours as needed for wheezing or shortness of breath.    [provider]  cephALEXin (KEFLEX) 500 MG capsule Take 1 capsule (500 mg total) by mouth 4 (four) times daily. 01/22/18   Mesner, Corene Cornea, MD  lisinopril (PRINIVIL,ZESTRIL) 10 MG tablet Take 10 mg by mouth daily.    [provider]  meclizine (ANTIVERT) 25 MG tablet Take 1 tablet (25 mg total) by mouth 3 (three) times daily as needed for dizziness. 01/05/18   Virgel Manifold, MD  metoprolol tartrate (LOPRESSOR) 12.5 mg TABS tablet Take 0.5 tablets (12.5 mg total) by mouth 2 (two) times daily. 11/24/13   Dixie Dials, MD  potassium chloride SA (K-DUR,KLOR-CON) 20 MEQ tablet Take 1 tablet (20  mEq total) by mouth 2 (two) times daily. 01/05/18   Virgel Manifold, MD    Family History Family History  Problem Relation Age of Onset  . Heart disease Father   . Colon cancer Sister        age 61    Social History Social History   Tobacco Use  . Smoking status: Never Smoker  . Smokeless tobacco: Never Used  Substance Use Topics  . Alcohol use: No  . Drug use: No     Allergies   Fish allergy   Review of Systems Review of Systems  Constitutional: Positive for activity change and appetite  change. Negative for chills and fever.  HENT: Negative for trouble swallowing.   Eyes: Negative for visual disturbance.  Respiratory: Negative for shortness of breath.   Cardiovascular: Negative for chest pain.  Gastrointestinal: Negative for abdominal pain and vomiting.  Musculoskeletal: Negative for arthralgias and myalgias.  Skin: Negative for rash.  Neurological: Positive for weakness (Generalized weakness). Negative for dizziness and headaches.  Psychiatric/Behavioral: Negative for confusion.     Physical Exam Updated Vital Signs BP (!) 168/96 (BP Location: Left Arm)   Pulse 88   Temp 98.7 F (37.1 C) (Oral)   Resp 18   Ht 5\' 5"  (1.651 m)   Wt 44.5 kg (98 lb)   SpO2 99%   BMI 16.31 kg/m   Physical Exam  Constitutional: She appears lethargic.  Patient appears weak and frail  HENT:  Mucous membranes dry  Neck: Normal range of motion. No JVD present. No thyromegaly present.  Cardiovascular: Normal rate, regular rhythm and intact distal pulses.  Pulmonary/Chest: Effort normal and breath sounds normal. No respiratory distress.  Abdominal: Soft. There is no tenderness. There is no guarding.  Musculoskeletal: She exhibits no edema.  Neurological: She appears lethargic. No sensory deficit.  Patient is somewhat lethargic, but answers questions appropriately.  Alert to person and place.  No facial weakness or pronator drift.  Skin: Skin is warm. Capillary refill takes less than 2 seconds. No rash noted.  Nursing note and vitals reviewed.    ED Treatments / Results  Labs (all labs ordered are listed, but only abnormal results are displayed) Labs Reviewed  BASIC METABOLIC PANEL - Abnormal; Notable for the following components:      Result Value   GFR calc non Af Amer 48 (*)    GFR calc Af Amer 55 (*)    All other components within normal limits  CBC WITH DIFFERENTIAL/PLATELET - Abnormal; Notable for the following components:   WBC 12.2 (*)    Hemoglobin 11.7 (*)    HCT  35.0 (*)    Neutro Abs 9.9 (*)    Monocytes Absolute 1.2 (*)    All other components within normal limits  URINALYSIS, ROUTINE W REFLEX MICROSCOPIC - Abnormal; Notable for the following components:   Color, Urine AMBER (*)    Hgb urine dipstick SMALL (*)    Ketones, ur 5 (*)    Bacteria, UA RARE (*)    All other components within normal limits  TROPONIN I - Abnormal; Notable for the following components:   Troponin I 0.06 (*)    All other components within normal limits  I-STAT CG4 LACTIC ACID, ED    EKG EKG Interpretation  Date/Time:  Thursday January 23 2018 10:22:07 EDT Ventricular Rate:  74 PR Interval:    QRS Duration: 129 QT Interval:  390 QTC Calculation: 433 R Axis:   -60 Text Interpretation:  Sinus  rhythm Multiple premature complexes, vent & supraven RBBB and LAFB Left ventricular hypertrophy since last tracing no significant change Confirmed by Noemi Chapel [phone]) on 01/23/2018 10:36:40 AM   Radiology Dg Chest 2 View  Result Date: 01/22/2018 CLINICAL DATA:  Loss of appetite, altered level of consciousness EXAM: CHEST - 2 VIEW COMPARISON:  11/29/2016 FINDINGS: The heart size and mediastinal contours are within normal limits. Both lungs are clear. The visualized skeletal structures are unremarkable. IMPRESSION: No active cardiopulmonary disease. Electronically Signed   By: Kathreen Devoid   On: 01/22/2018 13:33   Ct Head Wo Contrast  Result Date: 01/22/2018 CLINICAL DATA:  Altered mental status, dizziness and generalized weakness EXAM: CT HEAD WITHOUT CONTRAST TECHNIQUE: Contiguous axial images were obtained from the base of the skull through the vertex without intravenous contrast. COMPARISON:  Head CT 11/29/2016 FINDINGS: Brain: There is no mass, hemorrhage or extra-axial collection. There is generalized atrophy without lobar predilection. There is no acute or chronic infarction. There is hypoattenuation of the periventricular white matter, most commonly indicating chronic  ischemic microangiopathy. Vascular: No abnormal hyperdensity of the major intracranial arteries or dural venous sinuses. No intracranial atherosclerosis. Skull: The visualized skull base, calvarium and extracranial soft tissues are normal. Sinuses/Orbits: No fluid levels or advanced mucosal thickening of the visualized paranasal sinuses. No mastoid or middle ear effusion. The orbits are normal. IMPRESSION: Generalized atrophy and chronic small vessel disease without acute intracranial abnormality. Electronically Signed   By: Ulyses Jarred M.D.   On: 01/22/2018 14:15    Procedures Procedures (including critical care time)  Medications Ordered in ED Medications  0.9 %  sodium chloride infusion (has no administration in time range)     Initial Impression / Assessment and Plan / ED Course  I have reviewed the triage vital signs and the nursing notes.  Pertinent labs & imaging results that were available during my care of the patient were reviewed by me and considered in my medical decision making (see chart for details).     1000 pt also seen by Dr. Sabra Heck care plan discussed.    1030 pt is resting comfortably, pt unable to stand without assistant.  Patient has generalized weakness without focal neuro deficit.  She follows commands appropriately.  Seen here yesterday and diagnosed with UTI was given IV Rocephin prior to discharge, but family has not started her Keflex.  Urine culture from yesterday pending.  Elevated troponin today is chronic.  84 year old female who lives at home by herself, possible UTI.  No clear origin of patient's weakness, CT of head from yesterday negative for acute findings.  Family requesting admit  42  Consulted Dr. Carles Collet and discussed pt findings, he agrees to arrange admit  Final Clinical Impressions(s) / ED Diagnoses   Final diagnoses:  Generalized weakness  Dehydration    ED Discharge Orders    None       Kem Parkinson, PA-C 01/23/18 1214      Noemi Chapel, MD 01/31/18 [phone]

## 2018-01-23 NOTE — Progress Notes (Signed)
Pharmacy Antibiotic Note  [facility] is a 84 y.o. female admitted on 01/23/2018 with aspiration pneumonia.  Pharmacy has been consulted for Unasyn dosing.  Plan: Unasyn 3 grams IV every 12 hours  Monitor labs, c/s, and patient improvement   Height: 5\' 5"  (165.1 cm) Weight: 98 lb (44.5 kg) IBW/kg (Calculated) : 57  Temp (24hrs), Avg:99.4 F (37.4 C), Min:98.7 F (37.1 C), Max:100 F (37.8 C)  Recent Labs  Lab 01/22/18 1419 01/23/18 0934  WBC 14.9* 12.2*  CREATININE 1.20* 1.00  LATICACIDVEN  --  1.10    Estimated Creatinine Clearance: 25.7 mL/min (by C-G formula based on SCr of 1 mg/dL).    Allergies  Allergen Reactions  . Fish Allergy Hives    Antimicrobials this admission: Unasyn 7/4 >>     Dose adjustments this admission: Unasyn -> q12 hour dosing  Microbiology results: N/A  Thank you for allowing pharmacy to be a part of this patient's care.  Ramond Craver 01/23/2018 12:36 PM

## 2018-01-24 ENCOUNTER — Inpatient Hospital Stay (HOSPITAL_COMMUNITY): Payer: Medicare PPO

## 2018-01-24 ENCOUNTER — Encounter (HOSPITAL_COMMUNITY): Payer: Self-pay | Admitting: Primary Care

## 2018-01-24 ENCOUNTER — Other Ambulatory Visit (HOSPITAL_COMMUNITY): Payer: Medicare PPO

## 2018-01-24 DIAGNOSIS — I4891 Unspecified atrial fibrillation: Secondary | ICD-10-CM

## 2018-01-24 DIAGNOSIS — Z515 Encounter for palliative care: Secondary | ICD-10-CM

## 2018-01-24 DIAGNOSIS — Z7189 Other specified counseling: Secondary | ICD-10-CM

## 2018-01-24 DIAGNOSIS — R531 Weakness: Secondary | ICD-10-CM

## 2018-01-24 LAB — LIPID PANEL
CHOL/HDL RATIO: 3 ratio
CHOLESTEROL: 167 mg/dL (ref 0–200)
HDL: 55 mg/dL (ref >40)
LDL Cholesterol: 94 mg/dL (ref 0–99)
TRIGLYCERIDES: 89 mg/dL (ref <150)
VLDL: 18 mg/dL (ref 0–40)

## 2018-01-24 LAB — CBC
HCT: 33.3 % — ABNORMAL LOW (ref 36.0–46.0)
HEMOGLOBIN: 10.5 g/dL — AB (ref 12.0–15.0)
MCH: 28.4 pg (ref 26.0–34.0)
MCHC: 31.5 g/dL (ref 30.0–36.0)
MCV: 90 fL (ref 78.0–100.0)
PLATELETS: 332 10*3/uL (ref 150–400)
RBC: 3.7 MIL/uL — AB (ref 3.87–5.11)
RDW: 14.8 % (ref 11.5–15.5)
WBC: 9 10*3/uL (ref 4.0–10.5)

## 2018-01-24 LAB — VITAMIN B12: Vitamin B-12: 220 pg/mL (ref 180–914)

## 2018-01-24 LAB — BASIC METABOLIC PANEL
ANION GAP: 12 (ref 5–15)
BUN: 13 mg/dL (ref 8–23)
CO2: 22 mmol/L (ref 22–32)
CREATININE: 0.82 mg/dL (ref 0.44–1.00)
Calcium: 8.4 mg/dL — ABNORMAL LOW (ref 8.9–10.3)
Chloride: 106 mmol/L (ref 98–111)
Glucose, Bld: 59 mg/dL — ABNORMAL LOW (ref 70–99)
Potassium: 3.1 mmol/L — ABNORMAL LOW (ref 3.5–5.1)
SODIUM: 140 mmol/L (ref 135–145)

## 2018-01-24 LAB — CBC WITH DIFFERENTIAL/PLATELET
Basophils Absolute: 0 10*3/uL (ref 0.0–0.1)
Basophils Relative: 0 %
Eosinophils Absolute: 0.1 10*3/uL (ref 0.0–0.7)
Eosinophils Relative: 1 %
HEMATOCRIT: 33 % — AB (ref 36.0–46.0)
Hemoglobin: 10.3 g/dL — ABNORMAL LOW (ref 12.0–15.0)
LYMPHS ABS: 3.5 10*3/uL (ref 0.7–4.0)
LYMPHS PCT: 35 %
MCH: 28.5 pg (ref 26.0–34.0)
MCHC: 31.2 g/dL (ref 30.0–36.0)
MCV: 91.2 fL (ref 78.0–100.0)
MONO ABS: 0.9 10*3/uL (ref 0.1–1.0)
MONOS PCT: 9 %
NEUTROS ABS: 5.5 10*3/uL (ref 1.7–7.7)
Neutrophils Relative %: 55 %
Platelets: 325 10*3/uL (ref 150–400)
RBC: 3.62 MIL/uL — ABNORMAL LOW (ref 3.87–5.11)
RDW: 14.7 % (ref 11.5–15.5)
WBC: 10.1 10*3/uL (ref 4.0–10.5)

## 2018-01-24 LAB — GLUCOSE, CAPILLARY: Glucose-Capillary: 206 mg/dL — ABNORMAL HIGH (ref 70–99)

## 2018-01-24 LAB — TROPONIN I: Troponin I: 0.13 ng/mL (ref <0.03)

## 2018-01-24 LAB — PHOSPHORUS: Phosphorus: 2.9 mg/dL (ref 2.5–4.6)

## 2018-01-24 LAB — MAGNESIUM: Magnesium: 1.8 mg/dL (ref 1.7–2.4)

## 2018-01-24 LAB — BRAIN NATRIURETIC PEPTIDE: B Natriuretic Peptide: 779 pg/mL — ABNORMAL HIGH (ref 0.0–100.0)

## 2018-01-24 LAB — HEMOGLOBIN A1C
HEMOGLOBIN A1C: 5.6 % (ref 4.8–5.6)
MEAN PLASMA GLUCOSE: 114.02 mg/dL

## 2018-01-24 LAB — T4, FREE: FREE T4: 0.93 ng/dL (ref 0.82–1.77)

## 2018-01-24 MED ORDER — METOPROLOL TARTRATE 5 MG/5ML IV SOLN
INTRAVENOUS | Status: AC
  Administered 2018-01-24: 5 mg via INTRAVENOUS
  Filled 2018-01-24: qty 5

## 2018-01-24 MED ORDER — MORPHINE SULFATE (PF) 2 MG/ML IV SOLN
2.0000 mg | Freq: Once | INTRAVENOUS | Status: AC
  Administered 2018-01-24: 2 mg via INTRAVENOUS
  Filled 2018-01-24: qty 1

## 2018-01-24 MED ORDER — FUROSEMIDE 10 MG/ML IJ SOLN
40.0000 mg | Freq: Once | INTRAMUSCULAR | Status: AC
  Administered 2018-01-24: 40 mg via INTRAVENOUS
  Filled 2018-01-24: qty 4

## 2018-01-24 MED ORDER — KCL IN DEXTROSE-NACL 20-5-0.9 MEQ/L-%-% IV SOLN
INTRAVENOUS | Status: DC
  Administered 2018-01-24: 13:00:00 via INTRAVENOUS

## 2018-01-24 MED ORDER — MAGNESIUM SULFATE 2 GM/50ML IV SOLN
2.0000 g | Freq: Once | INTRAVENOUS | Status: AC
  Administered 2018-01-24: 2 g via INTRAVENOUS
  Filled 2018-01-24: qty 50

## 2018-01-24 MED ORDER — METOPROLOL TARTRATE 5 MG/5ML IV SOLN
5.0000 mg | Freq: Once | INTRAVENOUS | Status: AC
  Administered 2018-01-24: 5 mg via INTRAVENOUS

## 2018-01-24 MED ORDER — NITROGLYCERIN 2 % TD OINT
1.0000 [in_us] | TOPICAL_OINTMENT | Freq: Four times a day (QID) | TRANSDERMAL | Status: DC
  Administered 2018-01-24: 1 [in_us] via TOPICAL
  Filled 2018-01-24: qty 1

## 2018-01-24 MED ORDER — METHYLPREDNISOLONE SODIUM SUCC 40 MG IJ SOLR
40.0000 mg | Freq: Once | INTRAMUSCULAR | Status: AC
  Administered 2018-01-24: 40 mg via INTRAVENOUS
  Filled 2018-01-24: qty 1

## 2018-01-24 MED ORDER — ONDANSETRON HCL 4 MG/2ML IJ SOLN
4.0000 mg | Freq: Once | INTRAMUSCULAR | Status: AC
  Administered 2018-01-24: 4 mg via INTRAVENOUS
  Filled 2018-01-24: qty 2

## 2018-01-24 MED ORDER — IPRATROPIUM-ALBUTEROL 0.5-2.5 (3) MG/3ML IN SOLN: 3.0000 mL | Freq: Four times a day (QID) | RESPIRATORY_TRACT | Status: DC | PRN

## 2018-01-24 MED ORDER — ENOXAPARIN SODIUM 40 MG/0.4ML ~~LOC~~ SOLN
40.0000 mg | SUBCUTANEOUS | Status: DC
  Administered 2018-01-24: 40 mg via SUBCUTANEOUS
  Filled 2018-01-24: qty 0.4

## 2018-01-24 MED ORDER — SODIUM CHLORIDE 0.9 % IV SOLN
3.0000 g | Freq: Three times a day (TID) | INTRAVENOUS | Status: DC
  Administered 2018-01-24 – 2018-01-25 (×4): 3 g via INTRAVENOUS
  Filled 2018-01-24 (×10): qty 3

## 2018-01-24 MED ORDER — DILTIAZEM HCL-DEXTROSE 100-5 MG/100ML-% IV SOLN (PREMIX)
5.0000 mg/h | INTRAVENOUS | Status: DC
  Administered 2018-01-24: 5 mg/h via INTRAVENOUS
  Filled 2018-01-24 (×2): qty 100

## 2018-01-24 MED ORDER — CYANOCOBALAMIN 1000 MCG/ML IJ SOLN
1000.0000 ug | Freq: Every day | INTRAMUSCULAR | Status: DC
  Administered 2018-01-25 – 2018-01-26 (×2): 1000 ug via INTRAMUSCULAR
  Filled 2018-01-24 (×2): qty 1

## 2018-01-24 MED ORDER — ORAL CARE MOUTH RINSE
15.0000 mL | Freq: Two times a day (BID) | OROMUCOSAL | Status: DC
  Administered 2018-01-24 – 2018-01-27 (×6): 15 mL via OROMUCOSAL

## 2018-01-24 MED ORDER — ASPIRIN 300 MG RE SUPP
300.0000 mg | Freq: Every day | RECTAL | Status: DC
  Administered 2018-01-24: 300 mg via RECTAL
  Filled 2018-01-24: qty 1

## 2018-01-24 NOTE — Progress Notes (Signed)
Modified Barium Swallow Progress Note  Patient Details  Name: [facility] MRN: [id number] Date of Birth: [birth year]  Today's Date: 01/24/2018  Modified Barium Swallow completed.  Full report located under Chart Review in the Imaging Section.  Brief recommendations include the following:  Clinical Impression  Pt presents with mild/mod oropharyngeal phase dysphagia and mod/severe pharyngoesophageal dysphagia due to large outpouching (suspect Zenker's but no radiologist present to confirm). Oral phase is characterized by oral transit delays, reduced bolus cohesiveness and premature spillage into pharynx; pharyngeal phase is marked by delay in swallow initiation with swallow trigger after spilling to the pyriforms with liquids and reduced cricopharyngeal relaxation with evidence of large outpouching suspicious for Zenker's diverticulum which collected thin, puree, and regular textures. Please see imaging tab for images of Zenker's. Pt protected airway reasonably well, but did have one episode of trace, silent aspiration of thin via straw sips. The presence of the large outpouching places Pt at significant risk of backflow into pharynx and eventual aspiration. Backflow from outpouching observed across consistencies and textures. Unfortunately, after discussion with MD, Pt is a poor candidate for Zenker's repair at this time given acute infarcts. [facility] is alert and responsive to questions today and tells her niece that she would not want a feeding tube and would like to eat by mouth. Recommend D3/mech soft with thin liquids via cup sips, avoid straws and encourage Pt to go slowly, take small bites, swallow several times for each swallow, and remain upright after meals for at least 30 minutes. Family understands that risk for aspiration is high at this time, in addition to dehydration/malnutrition. Recommend RD consult to assist with food choices. Suspect that Pt will tolerate liquids more efficiently than  solids and would recommend crushing pills as able in puree. SLP will follow during acute stay. Above to RN and MD.    Ricka Burdock Evaluation Recommendations   Recommended Consults: Consider esophageal assessment;Consider ENT evaluation   SLP Diet Recommendations: Dysphagia 3 (Mech soft) solids;Thin liquid(after discussion with family and Dr. Carles Collet)   Liquid Administration via: Cup   Medication Administration: Crushed with puree   Supervision: Patient able to self feed;Full supervision/cueing for compensatory strategies   Compensations: Slow rate;Small sips/bites;Multiple dry swallows after each bite/sip   Postural Changes: Remain semi-upright after after feeds/meals (Comment);Seated upright at 90 degrees   Oral Care Recommendations: Oral care BID;Staff/trained caregiver to provide oral care   Other Recommendations: Clarify dietary restrictions  Thank you,  Genene Churn, [place]   [place] 01/24/2018,4:13 PM

## 2018-01-24 NOTE — Care Management (Signed)
Noted CM consult for home health. Chart reviewed. Patient from home alone. MRI +. Per MD notes, anticipate need for SNF. PT eval pending. CM will follow for ongoing needs.

## 2018-01-24 NOTE — Progress Notes (Signed)
PROGRESS NOTE  [facility]:[id number] DOB: 04/13/1925 DOA: 01/23/2018 PCP: Redmond School, MD  Brief History:   84 y.o. female with medical history of cognitive impairment, hypertension, diverticulosis, systolic CHF, coronary artery disease presenting with 4-day history of generalized weakness.  At baseline, the patient is independent with all her ADLs although the patient's niece states that she does have some memory problems.  In the past month, her niece has noted that the patient has had increasing gait instability.  However, because of her worsening generalized weakness, the patient was brought to emergency department on 01/22/2018.  Patient improved clinically after IV fluids in the emergency department.  She was sent home with cephalexin for treatment of UTI.  However, the patient continued to remain somnolent with worsening generalized weakness to the point she was not able to get out of bed on the morning of 01/23/2018.  As result, the patient was brought back to the emergency department for further evaluation.  Because of her somnolence and generalized weakness, the patient has had decreased oral intake.  In the emergency department, the patient was afebrile hemodynamically stable saturating 99% on room air.  WBC was 12.2 with hemoglobin 11.7.  BMP showed serum creatinine 1.00.  Otherwise electro lites were unremarkable.  Troponin was 0.06.  Urinalysis was negative for pyuria.  Chest x-ray was negative for any acute findings.  CT of the brain was negative for acute findings.    Assessment/Plan: Acute metabolic encephalopathy -Concerned about possible stroke with left hemiparesis -Likely multifactorial including UTI and dehydration -more alert but remains confused -Serum B12--pending -Check NTIRWER--15 -Folic QMGQ--6.7 -YPP--5.093  Aspiration pneumonitis -The patient was noted to have a retained pill in her posterior oropharynx in ED -now with retained secretions in post  oropharynx -Speech therapy evaluation -Continue Unasyn  Dehydration -The patient is hemoconcentrated -Continue IV fluids  Pyuria -Continue unasyn pending urine culture data  Left hemiparesis -PT evaluation -Speech therapy eval -CT brain--neg for acute findings -MRI brain-- -Carotid Duplex-- -Echo-- -LDL--94 -HbA1C-- -Antiplatelet--ASA supp  Essential Hypertension -allowing for permissive HTN -holding metoprolol and lisinopril  Elevated troponin -appears chronic likely due to cardiomyopathy with EF 20-25% -repeat echo -cycle troponins--flat trend  Chronic systolic and diastolic CHF -Appears hypovolemic presently -Daily weights -11/18/2013 echo EF 20-25%, grade 1 DD  Hypokalemia -replete -check mag -add KCl to IVF  Goal of Care -DNR -palliative medicine consult -pt now with poor po intake and hypoglycemia   Disposition Plan:   SNF 2-3 days Family Communication:   Niece updated at bedside  Consultants:  Palliative medicine  Code Status:  DNR  DVT Prophylaxis:  [place] Lovenox   Procedures: As Listed in Progress Note Above  Antibiotics: unasyn 7/4>>>    Subjective: Pt remains pleasantly confused.  Denies cp, abd pain, sob, abdominal pain, headache.  Remainder review of systems unobtainable.  Objective: Vitals:   01/23/18 1513 01/23/18 2111 01/24/18 0500 01/24/18 0533  BP:  (!) 164/67  (!) 168/83  Pulse: 64 78  77  Resp:      Temp:  99 F (37.2 C)  98.8 F (37.1 C)  TempSrc:  Oral  Oral  SpO2: 98% 96%  97%  Weight:   46.9 kg (103 lb 8 oz)   Height:        Intake/Output Summary (Last 24 hours) at 01/24/2018 1055 Last data filed at 01/24/2018 0539 Gross per 24 hour  Intake 1428.75 ml  Output 475 ml  Net 953.75 ml   Weight change:  Exam:   General:  Pt is alert, does not follow commands appropriately, not in acute distress  HEENT: No icterus, No thrush, No neck mass, [place]/AT  Cardiovascular: Irregular, S1/S2, no rubs, no  gallops  Respiratory: Bibasilar rales.  No wheezing.  Abdomen: Soft/+BS, non tender, non distended, no guarding  Extremities: No edema, No lymphangitis, No petechiae, No rashes, no synovitis   Data Reviewed: I have personally reviewed following labs and imaging studies Basic Metabolic Panel: Recent Labs  Lab 01/22/18 1419 01/23/18 0934 01/24/18 0628  NA 134* 136 140  K 4.2 4.4 3.1*  CL 97* 102 106  CO2 26 25 22   GLUCOSE 95 85 59*  BUN 16 15 13   CREATININE 1.20* 1.00 0.82  CALCIUM 9.4 8.9 8.4*   Liver Function Tests: Recent Labs  Lab 01/22/18 1419  AST 40  ALT 20  ALKPHOS 65  BILITOT 1.3*  PROT 8.2*  ALBUMIN 3.8   No results for input(s): LIPASE, AMYLASE in the last 168 hours. Recent Labs  Lab 01/23/18 1345  AMMONIA 10   Coagulation Profile: No results for input(s): INR, PROTIME in the last 168 hours. CBC: Recent Labs  Lab 01/22/18 1419 01/23/18 0934 01/24/18 0628  WBC 14.9* 12.2* 9.0  NEUTROABS 12.4* 9.9*  --   HGB 12.8 11.7* 10.5*  HCT 38.5 35.0* 33.3*  MCV 88.5 88.8 90.0  PLT 337 287 332   Cardiac Enzymes: Recent Labs  Lab 01/22/18 1419 01/23/18 0934 01/23/18 1345 01/23/18 1905  TROPONINI 0.05* 0.06* 0.06* 0.07*   BNP: Invalid input(s): POCBNP CBG: Recent Labs  Lab 01/22/18 1305  GLUCAP 110*   HbA1C: No results for input(s): HGBA1C in the last 72 hours. Urine analysis:    Component Value Date/Time   COLORURINE AMBER (A) 01/23/2018 1001   APPEARANCEUR CLEAR 01/23/2018 1001   LABSPEC 1.016 01/23/2018 1001   PHURINE 5.0 01/23/2018 1001   GLUCOSEU NEGATIVE 01/23/2018 1001   HGBUR SMALL (A) 01/23/2018 1001   BILIRUBINUR NEGATIVE 01/23/2018 1001   KETONESUR 5 (A) 01/23/2018 1001   PROTEINUR NEGATIVE 01/23/2018 1001   UROBILINOGEN 0.2 11/17/2013 1123   NITRITE NEGATIVE 01/23/2018 1001   LEUKOCYTESUR NEGATIVE 01/23/2018 1001   Sepsis Labs: @LABRCNTIP (procalcitonin:4,lacticidven:4) )No results found for this or any previous visit  (from the past 240 hour(s)).   Scheduled Meds: . enoxaparin (LOVENOX) injection  40 mg Subcutaneous Q24H   Continuous Infusions: . sodium chloride 75 mL/hr at 01/24/18 0504  . ampicillin-sulbactam (UNASYN) IV Stopped (01/24/18 0917)    Procedures/Studies: Dg Chest 2 View  Result Date: 01/22/2018 CLINICAL DATA:  Loss of appetite, altered level of consciousness EXAM: CHEST - 2 VIEW COMPARISON:  11/29/2016 FINDINGS: The heart size and mediastinal contours are within normal limits. Both lungs are clear. The visualized skeletal structures are unremarkable. IMPRESSION: No active cardiopulmonary disease. Electronically Signed   By: Kathreen Devoid   On: 01/22/2018 13:33   Ct Head Wo Contrast  Result Date: 01/22/2018 CLINICAL DATA:  Altered mental status, dizziness and generalized weakness EXAM: CT HEAD WITHOUT CONTRAST TECHNIQUE: Contiguous axial images were obtained from the base of the skull through the vertex without intravenous contrast. COMPARISON:  Head CT 11/29/2016 FINDINGS: Brain: There is no mass, hemorrhage or extra-axial collection. There is generalized atrophy without lobar predilection. There is no acute or chronic infarction. There is hypoattenuation of the periventricular white matter, most commonly indicating chronic ischemic microangiopathy. Vascular: No abnormal hyperdensity of the major intracranial arteries or dural  venous sinuses. No intracranial atherosclerosis. Skull: The visualized skull base, calvarium and extracranial soft tissues are normal. Sinuses/Orbits: No fluid levels or advanced mucosal thickening of the visualized paranasal sinuses. No mastoid or middle ear effusion. The orbits are normal. IMPRESSION: Generalized atrophy and chronic small vessel disease without acute intracranial abnormality. Electronically Signed   By: Ulyses Jarred M.D.   On: 01/22/2018 14:15    Orson Eva, DO  [facility] Pager [phone]  If 7PM-7AM, please contact  night-coverage [email or web address] Password [email or web address] 01/24/2018, 10:55 AM   LOS: 1 day

## 2018-01-24 NOTE — Consult Note (Signed)
Consultation Note Date: 01/24/2018   Patient Name: Lynn Weaver  DOB: 1925/03/21  MRN: [id number]  Age / Sex: 84 y.o., female  PCP: Redmond School, MD Referring Physician: Orson Eva, MD  Reason for Consultation: Establishing goals of care  HPI/Patient Profile: 83 y.o. female  with past medical history of anemia, diverticulosis, GI bleeds, hemorrhoids, hypertension admitted on 01/23/2018 with acute metabolic encephalopathy, stroke right MCA bifurcation.   Clinical Assessment and Goals of Care: Mrs. Mccannon is sitting up in bed.  She greets me making and keeping eye contact.  She is calm and cooperative.  She is able to tell me her name, but not where we are stating "at your place".  She does however tell me that she is hungry.  Present today at bedside is great niece, caregiver, T.  We talked about Mrs. Hum's acute health concern of stroke.  We talked about the different ways people have strokes including bleeding and occlusion.  I share the medical team concerned that the stroke may continue to evolve/worsen.  Call to primary decision maker, niece, Amador Cunas.  No answer, left generic voicemail message.  We will follow-up on Monday.  Conference with nursing staff related to plan of care. Conference with speech therapy related to swallow study/diet. Conference with hospitalist related to plan of care, test results.  Healthcare power of attorney NEXT OF KIN -primary decision maker is Mrs. Wisher brother's daughter, niece, Amador Cunas.  Also participating in her care is her sisters grand daughter, "T".  Listed in chart as daughter, Osvaldo Human, is actually Mrs. Riccardi sister's daughter, who she raised.   SUMMARY OF RECOMMENDATIONS   At this point, continue to treat the treatable but no CPR, no intubation. 24 to 48 hours for outcomes.  Code Status/Advance Care Planning:  DNR  Symptom Management:     Per hospitalist, no additional needs at this time.  Palliative Prophylaxis:   Aspiration barium swallow today.  Additional Recommendations (Limitations, Scope, Preferences):  Continue to treat the treatable but no CPR, no intubation.  Psycho-social/Spiritual:   Desire for further Chaplaincy support:no  Additional Recommendations: Caregiving  Support/Resources and Education on Hospice  Prognosis:   Unable to determine based on outcomes, progression of stroke.  If Mrs. Gadsby is able to survive over the next several days, she may have better odds at longer survival.  6 months or less, regardless would not be surprising.  Discharge Planning: To be determined, based on outcomes.      Primary Diagnoses: Present on Admission: . Dehydration   I have reviewed the medical record, interviewed the patient and family, and examined the patient. The following aspects are pertinent.  Past Medical History:  Diagnosis Date  . Anemia   . Diverticulosis   . GI bleed    diverticular/hemorrhoidal  . Hemorrhoids   . Hypertension    Social History   Socioeconomic History  . Marital status: Widowed    Spouse name: Not on file  . Number of children: Not on file  . Years  of education: Not on file  . Highest education level: Not on file  Occupational History  . Not on file  Social Needs  . Financial resource strain: Not on file  . Food insecurity:    Worry: Not on file    Inability: Not on file  . Transportation needs:    Medical: Not on file    Non-medical: Not on file  Tobacco Use  . Smoking status: Never Smoker  . Smokeless tobacco: Never Used  Substance and Sexual Activity  . Alcohol use: No  . Drug use: No  . Sexual activity: Not on file  Lifestyle  . Physical activity:    Days per week: Not on file    Minutes per session: Not on file  . Stress: Not on file  Relationships  . Social connections:    Talks on phone: Not on file    Gets together: Not on file     Attends religious service: Not on file    Active member of club or organization: Not on file    Attends meetings of clubs or organizations: Not on file    Relationship status: Not on file  Other Topics Concern  . Not on file  Social History Narrative  . Not on file   Family History  Problem Relation Age of Onset  . Heart disease Father   . Colon cancer Sister        age 66   Scheduled Meds: . aspirin  300 mg Rectal Daily  . enoxaparin (LOVENOX) injection  40 mg Subcutaneous Q24H   Continuous Infusions: . ampicillin-sulbactam (UNASYN) IV Stopped (01/24/18 0917)  . dextrose 5 % and 0.9 % NaCl with KCl 20 mEq/L 75 mL/hr at 01/24/18 1252   PRN Meds:.acetaminophen **OR** acetaminophen, hydrALAZINE, ondansetron **OR** ondansetron (ZOFRAN) IV Medications Prior to Admission:  Prior to Admission medications   Medication Sig Start Date End Date Taking? Authorizing Provider  albuterol (PROVENTIL HFA;VENTOLIN HFA) 108 (90 Base) MCG/ACT inhaler Inhale 1-2 puffs into the lungs every 6 (six) hours as needed for wheezing or shortness of breath.   Yes [provider]  albuterol (PROVENTIL) (2.5 MG/3ML) 0.083% nebulizer solution Take 2.5 mg by nebulization every 6 (six) hours as needed for wheezing or shortness of breath.   Yes [provider]  lisinopril (PRINIVIL,ZESTRIL) 10 MG tablet Take 10 mg by mouth daily.   Yes [provider]  meclizine (ANTIVERT) 25 MG tablet Take 1 tablet (25 mg total) by mouth 3 (three) times daily as needed for dizziness. 01/05/18  Yes Virgel Manifold, MD  metoprolol tartrate (LOPRESSOR) 12.5 mg TABS tablet Take 0.5 tablets (12.5 mg total) by mouth 2 (two) times daily. 11/24/13  Yes Dixie Dials, MD  potassium chloride SA (K-DUR,KLOR-CON) 20 MEQ tablet Take 1 tablet (20 mEq total) by mouth 2 (two) times daily. 01/05/18  Yes Virgel Manifold, MD  cephALEXin (KEFLEX) 500 MG capsule Take 1 capsule (500 mg total) by mouth 4 (four) times daily. 01/22/18    Mesner, Corene Cornea, MD   Allergies  Allergen Reactions  . Fish Allergy Hives   Review of Systems  Unable to perform ROS: Age    Physical Exam  Constitutional: No distress.  Makes and briefly keeps eye contact, frail  HENT:  Head: Atraumatic.  Some temporal wasting  Cardiovascular: Normal rate.  Pulmonary/Chest: Effort normal. No respiratory distress.  Abdominal: Soft. She exhibits no distension.  Musculoskeletal: She exhibits no edema.  Frail  Neurological: She is alert.  Oriented to  self only, calm and cooperative  Skin: Skin is warm and dry.  Psychiatric:  Calm, not fearful  Nursing note and vitals reviewed.   Vital Signs: BP (!) 168/83 (BP Location: Left Arm)   Pulse 77   Temp 98.8 F (37.1 C) (Oral)   Resp 18   Ht 5\' 5"  (1.651 m)   Wt 46.9 kg (103 lb 8 oz)   SpO2 97%   BMI 17.22 kg/m  Pain Scale: 0-10   Pain Score: 0-No pain   SpO2: SpO2: 97 % O2 Device:SpO2: 97 % O2 Flow Rate: .   IO: Intake/output summary:   Intake/Output Summary (Last 24 hours) at 01/24/2018 1419 Last data filed at 01/24/2018 1300 Gross per 24 hour  Intake 1428.75 ml  Output 975 ml  Net 453.75 ml    LBM: Last BM Date: (unknown) Baseline Weight: Weight: 44.5 kg (98 lb) Most recent weight: Weight: 46.9 kg (103 lb 8 oz)     Palliative Assessment/Data:   Flowsheet Rows     Most Recent Value  Intake Tab  Referral Department  Hospitalist  Unit at Time of Referral  Cardiac/Telemetry Unit  Palliative Care Primary Diagnosis  Neurology  Date Notified  01/24/18  Palliative Care Type  New Palliative care  Reason for referral  Clarify Goals of Care  Date of Admission  01/23/18  Date first seen by Palliative Care  01/24/18  # of days Palliative referral response time  0 Day(s)  # of days IP prior to Palliative referral  1  Clinical Assessment  Palliative Performance Scale Score  30%  Pain Max last 24 hours  Not able to report  Pain Min Last 24 hours  Not able to report  Dyspnea Max  Last 24 Hours  Not able to report  Dyspnea Min Last 24 hours  Not able to report  Psychosocial & Spiritual Assessment  Palliative Care Outcomes  Patient/Family meeting held?  Yes  Who was at the meeting?  Patient and great-niece T at bedside, niece Antoinette via phone  Palliative Care Outcomes  Clarified goals of care, Provided psychosocial or spiritual support  Patient/Family wishes: Interventions discontinued/not started   Mechanical Ventilation      Time In: 1330 Time Out: 1420 Time Total: 50 minutes Greater than 50%  of this time was spent counseling and coordinating care related to the above assessment and plan.  Signed by: Drue Novel, NP   Please contact Palliative Medicine Team phone at [phone] for questions and concerns.  For individual provider: See Shea Evans

## 2018-01-24 NOTE — Progress Notes (Signed)
Case discussed with radiology, Dr. Nevada Crane -acute scattered R-MCA infarcts -concern for LVO of RMCA  Case discussed with neurology, Dr. Cheral Marker -pt is outside window for acute intervention as her symptoms of "generalized weakness" started at least 4 days prior to admission-->therefore, suspect this process has been evolving over past 4 days -Dr. Cheral Marker did not feel patient needed to be transferred to higher level of care, [facility], as stroke work up can be completed here -MRI, MRA brain and echo reviewed with Dr. Cheral Marker -unless carotid shows hemodynamically significant stenosis, transfer not indicated per neurology  Family updated and agree with present management.  DTat

## 2018-01-24 NOTE — Progress Notes (Addendum)
Night shift floor coverage note.  The patient was seen due to A. fib with RVR in the 170s and 180s with respiratory distress.  She was unable to out further details, as she was somnolent.  Total signs were 98.6 F, heart rate 173, respirations 28, blood pressure 175/85 mmHg and O2 sat 79% on room air.  O2 sat increased to 85% on nasal cannula, but subsequently put on NRB mask, which increase the O2 sat to 100%.   General in acute respiratory distress. HEENT normocephalic. Neck: Positive JVD. Lungs; decreased breath sounds bilaterally with bibasilar Rales and rhonchi. Heart:, S2, irregularly regular, no murmurs Abdomen: Soft nontender. Extremities: Trace edema.  EKG Vent. rate 178 BPM PR interval * ms QRS duration 122 ms QT/QTc 236/406 ms P-R-T axes * -67 111 Atrial fibrillation with rapid ventricular response with premature ventricular or aberrantly conducted complexes Right bundle branch block Left anterior fascicular block Bifascicular block Minimal voltage criteria for LVH, may be normal variant T wave abnormality, consider lateral ischemia Abnormal ECG  A new set of labs with obtaining an white count was 10.1 with a normal differential, hemoglobin 10.3 g/dL and platelets 325.  BNP was 779.0 pg/mL.  Troponin 0.13 ng/mL.  Than 35, potassium 3.0, chloride 104 and CO2 22 mmol/L.  BUN was 11, creatinine 0.96, calcium 8.2, glucose 199, magnesium 1.8 and phosphorus 2.9 mg/dL.  Chest radiograph showed increased cardiomegaly.  01/23/2018 echocardiogram ------------------------------------------------------------------- LV EF: 55%  ------------------------------------------------------------------- Indications:      Stroke 434.91.  ------------------------------------------------------------------- History:   PMH:  Left hemiparesis Acute metabolic encephalopathy. Acquired from the patient and from the patient&'s chart.  PMH: Non-STEMI (non-ST elevated myocardial infarction) Chronic  combined systolic and diastolic CHF (congestive heart failure)  Risk factors:  Hypertension.  ------------------------------------------------------------------- Study Conclusions  - Left ventricle: The cavity size was normal. Wall thickness was   increased in a pattern of mild LVH. Systolic function was normal.   The estimated ejection fraction was 55%. Possible hypokinesis of   the basalinferior myocardium. Doppler parameters are consistent   with abnormal left ventricular relaxation (grade 1 diastolic   dysfunction). - Aortic valve: Trileaflet; mildly thickened leaflets. - Mitral valve: Mildly thickened leaflets. There was moderate   regurgitation directed posteriorly. - Left atrium: The atrium was severely dilated. - Atrial septum: No defect or patent foramen ovale was identified. - Tricuspid valve: Mildly thickened leaflets. There was moderate   regurgitation. - Pulmonary arteries: Systolic pressure was severely increased. PA   peak pressure: 83 mm Hg (S). - Pericardium, extracardiac: There was no pericardial effusion.   Assessment:  Atrial fibrillation with aVR. Volume overload. Hypokalemia. Elevated troponin  Plan: Transfer to ICU. Prior to my assessment, the patient received metoprolol 5 mg IVP earlier without significant results. I will start the patient on diltiazem infusion to control heart rate better. Supplemental magnesium and potassium given.  Furosemide, Nitropaste and single dose of morphine 2 mg IVP given for volume overload. Trend troponin level.  Tennis Must, MD.  About 35 minutes of critical care time were spent during the process of this urgent event.  The patient required transfer to the intensive care unit.  This document was prepared using Dragon voice recognition software and may contain some unintended errors.

## 2018-01-24 NOTE — Progress Notes (Signed)
Report called to ICU RN. Patient transferred via bed by Elouise Munroe RN and Georgina Snell, [facility] RN. This RN called Langley Adie, Daughter, but just had a beeping response as if phone is off. Antionette Nori Riis, niece, called and notified of patient's change in condition and transfer to ICU room 6.

## 2018-01-24 NOTE — Evaluation (Signed)
Clinical/Bedside Swallow Evaluation Patient Details  Name: Lynn Weaver MRN: [id number] Date of Birth: 02/07/1925  Today's Date: 01/24/2018 Time: SLP Start Time (ACUTE ONLY): 1315 SLP Stop Time (ACUTE ONLY): 1338 SLP Time Calculation (min) (ACUTE ONLY): 23 min  Past Medical History:  Past Medical History:  Diagnosis Date  . Anemia   . Diverticulosis   . GI bleed    diverticular/hemorrhoidal  . Hemorrhoids   . Hypertension    Past Surgical History:  Past Surgical History:  Procedure Laterality Date  . CATARACT EXTRACTION W/PHACO  05/29/2012   Procedure: CATARACT EXTRACTION PHACO AND INTRAOCULAR LENS PLACEMENT (IOC);  Surgeon: Tonny Branch, MD;  Location: [facility];  Service: Ophthalmology;  Laterality: Right;  CDE:18.72  . CHOLECYSTECTOMY    . COLONOSCOPY N/A 06/24/2013   Significant diverticular disease. Large hemorrhoids. Terminal ileum normal. 2 rectosigmoid polyps (TA) removed. No old or fresh blood noted during exam. Likely had bleeding from diverticula and hemorrhoids. consider flex sig in 3 years.  Marland Kitchen LEFT AND RIGHT HEART CATHETERIZATION WITH CORONARY ANGIOGRAM N/A 11/23/2013   Procedure: LEFT AND RIGHT HEART CATHETERIZATION WITH CORONARY ANGIOGRAM;  Surgeon: Birdie Riddle, MD;  Location: [facility];  Service: Cardiovascular;  Laterality: N/A;   HPI:  84 y.o.femalewith medical history ofcognitive impairment, hypertension, diverticulosis, systolic CHF, coronary artery disease presenting with4-day history of generalized weakness. At baseline, the patient is independent with all her ADLs although the patient's niece states that she does have some memory problems. In the past month, her niece has noted that the patient has had increasing gait instability. However, because of her worsening generalized weakness, the patient was brought to emergency department on 01/22/2018. Patient improved clinically after IV fluids in the emergency department. She was sent home with cephalexin for  treatment of UTI. However, the patient continued to remain somnolent with worsening generalized weakness to the point she was not able to get out of bed on the morning of 01/23/2018. As result, the patient was brought back to the emergency department for further evaluation. Because of her somnolence and generalized weakness, the patient has had decreased oral intake. In the emergency department, the patient was afebrile hemodynamically stable saturating 99% on room air. WBC was 12.2 with hemoglobin 11.7. BMP showed serum creatinine 1.00. Otherwise electro lites were unremarkable. Troponin was 0.06. Urinalysis was negative for pyuria. Chest x-ray was negative for any acute findings. CT of the brain was negative for acute findings. MRI shows: Evidence of acute Large Vessel Occlusion at the Right MCA bifurcation. Decreased/absent flow in the anterior and middle right MCA divisions. Associated scattered acute small infarcts in the Right MCA territory, primarily the middle division. No associated hemorrhage or mass effect. Mild or at most moderate left MCA M1 stenosis, no other intracranial large vessel abnormality. Small chronic left PCA and right cerebellar territory infarcts. Moderately advanced chronic white matter disease. BSE requested.   Assessment / Plan / Recommendation Clinical Impression  Pt seen for clinical swallow evaluation with family present in room. Pt lives at home alone, but a niece comes by to check on her. Family reports that Pt is very independent prior to admission, but has frequently held phlegm in her mouth for unknown reasons. Pt exhibits signs and symptoms of aspiration this date with thin liquids- gurgling with multiple swallows and delayed strong coughing. Will proceed directly to MBSS given that it is Friday. Family in agreement. Results to follow.  SLP Visit Diagnosis: Dysphagia, unspecified (R13.10)    Aspiration Risk  Moderate aspiration risk;Risk for inadequate  nutrition/hydration    Diet Recommendation NPO        Other  Recommendations     Follow up Recommendations Skilled Nursing facility      Frequency and Duration min 2x/week  1 week       Prognosis Prognosis for Safe Diet Advancement: Guarded Barriers to Reach Goals: Severity of deficits      Swallow Study   General Date of Onset: 01/22/18 HPI: 84 y.o.femalewith medical history ofcognitive impairment, hypertension, diverticulosis, systolic CHF, coronary artery disease presenting with4-day history of generalized weakness. At baseline, the patient is independent with all her ADLs although the patient's niece states that she does have some memory problems. In the past month, her niece has noted that the patient has had increasing gait instability. However, because of her worsening generalized weakness, the patient was brought to emergency department on 01/22/2018. Patient improved clinically after IV fluids in the emergency department. She was sent home with cephalexin for treatment of UTI. However, the patient continued to remain somnolent with worsening generalized weakness to the point she was not able to get out of bed on the morning of 01/23/2018. As result, the patient was brought back to the emergency department for further evaluation. Because of her somnolence and generalized weakness, the patient has had decreased oral intake. In the emergency department, the patient was afebrile hemodynamically stable saturating 99% on room air. WBC was 12.2 with hemoglobin 11.7. BMP showed serum creatinine 1.00. Otherwise electro lites were unremarkable. Troponin was 0.06. Urinalysis was negative for pyuria. Chest x-ray was negative for any acute findings. CT of the brain was negative for acute findings. MRI shows: Evidence of acute Large Vessel Occlusion at the Right MCA bifurcation. Decreased/absent flow in the anterior and middle right MCA divisions. Associated scattered acute small  infarcts in the Right MCA territory, primarily the middle division. No associated hemorrhage or mass effect. Mild or at most moderate left MCA M1 stenosis, no other intracranial large vessel abnormality. Small chronic left PCA and right cerebellar territory infarcts. Moderately advanced chronic white matter disease. BSE requested. Type of Study: Bedside Swallow Evaluation Previous Swallow Assessment: None on record Diet Prior to this Study: NPO Temperature Spikes Noted: No Respiratory Status: Room air History of Recent Intubation: No Behavior/Cognition: Alert;Cooperative;Pleasant mood;Requires cueing Oral Cavity Assessment: Excessive secretions Oral Care Completed by SLP: Yes Oral Cavity - Dentition: Edentulous(has dentures) Vision: Impaired for self-feeding Self-Feeding Abilities: Able to feed self;Needs set up Patient Positioning: Upright in bed Baseline Vocal Quality: Normal;Low vocal intensity Volitional Cough: Weak Volitional Swallow: Able to elicit    Oral/Motor/Sensory Function Overall Oral Motor/Sensory Function: Mild impairment Facial ROM: Reduced left;Suspected CN VII (facial) dysfunction Facial Symmetry: Within Functional Limits Facial Strength: Reduced left Facial Sensation: Within Functional Limits Lingual ROM: Within Functional Limits Lingual Symmetry: Within Functional Limits Lingual Strength: Within Functional Limits Lingual Sensation: Within Functional Limits Velum: (could not visualize) Mandible: Within Functional Limits   Ice Chips Ice chips: Within functional limits Presentation: Spoon   Thin Liquid Thin Liquid: Impaired Presentation: Cup;Self Fed Pharyngeal  Phase Impairments: Suspected delayed Swallow;Decreased hyoid-laryngeal movement;Multiple swallows;Cough - Delayed(gurgling post swallow and one episode of strong coughing)    Nectar Thick Nectar Thick Liquid: Not tested   Honey Thick Honey Thick Liquid: Not tested   Puree Puree: Not tested    Solid   Thank you,  Genene Churn, CCC-SLP [phone]    Solid: Not tested        Lila Lufkin 01/24/2018,2:02  PM

## 2018-01-24 NOTE — Progress Notes (Signed)
Patient transferred form 300 for afib RVR. Patient alert and oriented to self only. HR in the 130s. Cardizem drip started. Will continue to monitor.  Patient has a ring with a patient sticker on it placed in the chart.

## 2018-01-25 DIAGNOSIS — I480 Paroxysmal atrial fibrillation: Secondary | ICD-10-CM

## 2018-01-25 DIAGNOSIS — I63511 Cerebral infarction due to unspecified occlusion or stenosis of right middle cerebral artery: Principal | ICD-10-CM

## 2018-01-25 LAB — BASIC METABOLIC PANEL
Anion gap: 9 (ref 5–15)
Anion gap: 12 (ref 5–15)
BUN: 11 mg/dL (ref 8–23)
BUN: 15 mg/dL (ref 8–23)
CALCIUM: 8.2 mg/dL — AB (ref 8.9–10.3)
CALCIUM: 8.6 mg/dL — AB (ref 8.9–10.3)
CO2: 22 mmol/L (ref 22–32)
CO2: 25 mmol/L (ref 22–32)
CREATININE: 0.96 mg/dL (ref 0.44–1.00)
CREATININE: 1.02 mg/dL — AB (ref 0.44–1.00)
Chloride: 104 mmol/L (ref 98–111)
Chloride: 100 mmol/L (ref 98–111)
GFR calc Af Amer: 58 mL/min — ABNORMAL LOW (ref >60)
GFR calc Af Amer: 54 mL/min — ABNORMAL LOW (ref >60)
GFR calc non Af Amer: 50 mL/min — ABNORMAL LOW (ref >60)
GFR calc non Af Amer: 47 mL/min — ABNORMAL LOW (ref >60)
GLUCOSE: 199 mg/dL — AB (ref 70–99)
GLUCOSE: 133 mg/dL — AB (ref 70–99)
Potassium: 3 mmol/L — ABNORMAL LOW (ref 3.5–5.1)
Potassium: 3.7 mmol/L (ref 3.5–5.1)
Sodium: 135 mmol/L (ref 135–145)
Sodium: 137 mmol/L (ref 135–145)

## 2018-01-25 LAB — TROPONIN I
TROPONIN I: 0.13 ng/mL — AB (ref <0.03)
TROPONIN I: 0.11 ng/mL — AB (ref <0.03)
Troponin I: 0.09 ng/mL (ref <0.03)

## 2018-01-25 LAB — URINE CULTURE

## 2018-01-25 LAB — MRSA PCR SCREENING: MRSA by PCR: NEGATIVE

## 2018-01-25 LAB — MAGNESIUM: MAGNESIUM: 2.1 mg/dL (ref 1.7–2.4)

## 2018-01-25 MED ORDER — POTASSIUM CHLORIDE 10 MEQ/100ML IV SOLN
10.0000 meq | INTRAVENOUS | Status: AC
  Administered 2018-01-25 (×2): 10 meq via INTRAVENOUS
  Filled 2018-01-25 (×2): qty 100

## 2018-01-25 MED ORDER — AMPICILLIN-SULBACTAM SODIUM 3 (2-1) G IJ SOLR
3.0000 g | Freq: Two times a day (BID) | INTRAMUSCULAR | Status: DC
Start: 2018-01-25 — End: 2018-01-27
  Administered 2018-01-25 – 2018-01-26 (×3): 3 g via INTRAVENOUS
  Filled 2018-01-25 (×4): qty 3

## 2018-01-25 MED ORDER — ALBUMIN HUMAN 25 % IV SOLN
25.0000 g | Freq: Once | INTRAVENOUS | Status: AC
  Administered 2018-01-25: 25 g via INTRAVENOUS
  Filled 2018-01-25: qty 100
  Filled 2018-01-25: qty 50

## 2018-01-25 MED ORDER — APIXABAN 2.5 MG PO TABS
2.5000 mg | ORAL_TABLET | Freq: Two times a day (BID) | ORAL | Status: DC
  Administered 2018-01-25 – 2018-01-27 (×4): 2.5 mg via ORAL
  Filled 2018-01-25 (×4): qty 1

## 2018-01-25 MED ORDER — SODIUM CHLORIDE 0.9% FLUSH
3.0000 mL | INTRAVENOUS | Status: DC | PRN
  Administered 2018-01-25: 3 mL via INTRAVENOUS
  Filled 2018-01-25: qty 3

## 2018-01-25 MED ORDER — POTASSIUM CHLORIDE 20 MEQ/15ML (10%) PO SOLN
40.0000 meq | Freq: Once | ORAL | Status: AC
  Administered 2018-01-25: 40 meq via ORAL
  Filled 2018-01-25: qty 30

## 2018-01-25 MED ORDER — ASPIRIN EC 81 MG PO TBEC
81.0000 mg | DELAYED_RELEASE_TABLET | Freq: Every day | ORAL | Status: DC
  Administered 2018-01-25 – 2018-01-27 (×3): 81 mg via ORAL
  Filled 2018-01-25 (×3): qty 1

## 2018-01-25 MED ORDER — SODIUM CHLORIDE 0.9 % IV SOLN
INTRAVENOUS | Status: DC
  Administered 2018-01-25 – 2018-01-26 (×2): via INTRAVENOUS

## 2018-01-25 MED ORDER — SODIUM CHLORIDE 0.9% FLUSH
3.0000 mL | Freq: Two times a day (BID) | INTRAVENOUS | Status: DC
  Administered 2018-01-25 – 2018-01-27 (×5): 3 mL via INTRAVENOUS

## 2018-01-25 NOTE — Progress Notes (Signed)
ANTICOAGULATION CONSULT NOTE - Initial Consult  Pharmacy Consult for apixaban Indication: atrial fibrillation  Allergies  Allergen Reactions  . Fish Allergy Hives    Patient Measurements: Height: 5\' 5"  (165.1 cm) Weight: 102 lb 11.8 oz (46.6 kg) IBW/kg (Calculated) : 57   Vital Signs: Temp: 97.3 F (36.3 C) (07/06 0756) Temp Source: Oral (07/06 0756) BP: 85/51 (07/06 0600) Pulse Rate: 63 (07/06 0756)  Labs: Recent Labs    01/23/18 0934  01/23/18 1905 01/24/18 0628 01/24/18 2244 01/25/18 0451  HGB 11.7*  --   --  10.5* 10.3*  --   HCT 35.0*  --   --  33.3* 33.0*  --   PLT 287  --   --  332 325  --   CREATININE 1.00  --   --  0.82 0.96  --   TROPONINI 0.06*   < > 0.07*  --  0.13* 0.13*   < > = values in this interval not displayed.    Estimated Creatinine Clearance: 28.1 mL/min (by C-G formula based on SCr of 0.96 mg/dL).   Medical History: Past Medical History:  Diagnosis Date  . Anemia   . Diverticulosis   . GI bleed    diverticular/hemorrhoidal  . Hemorrhoids   . Hypertension     Medications:  Medications Prior to Admission  Medication Sig Dispense Refill Last Dose  . albuterol (PROVENTIL HFA;VENTOLIN HFA) 108 (90 Base) MCG/ACT inhaler Inhale 1-2 puffs into the lungs every 6 (six) hours as needed for wheezing or shortness of breath.   01/22/2018 at Unknown time  . albuterol (PROVENTIL) (2.5 MG/3ML) 0.083% nebulizer solution Take 2.5 mg by nebulization every 6 (six) hours as needed for wheezing or shortness of breath.   unknown  . lisinopril (PRINIVIL,ZESTRIL) 10 MG tablet Take 10 mg by mouth daily.   01/22/2018 at Unknown time  . meclizine (ANTIVERT) 25 MG tablet Take 1 tablet (25 mg total) by mouth 3 (three) times daily as needed for dizziness. 30 tablet 0 01/22/2018 at Unknown time  . metoprolol tartrate (LOPRESSOR) 12.5 mg TABS tablet Take 0.5 tablets (12.5 mg total) by mouth 2 (two) times daily. 30 tablet 3 01/22/2018 at 0800  . potassium chloride SA  (K-DUR,KLOR-CON) 20 MEQ tablet Take 1 tablet (20 mEq total) by mouth 2 (two) times daily. 10 tablet 0 01/22/2018 at Unknown time  . cephALEXin (KEFLEX) 500 MG capsule Take 1 capsule (500 mg total) by mouth 4 (four) times daily. 28 capsule 0 hasn't started yet    Assessment: Pharmacy consulted to dose apixaban in patient with atrial fibrillation.  Patient previously had lovenox 7/5 1800 so will start apixaban 24 hours from that. Patient's apxiaban needs renally adjusted due to weight <60 kg and age >29. Goal of Therapy:   Monitor platelets by anticoagulation protocol: Yes   Plan:  Apixaban 2.5 mg BID Monitor labs and s/s of bleeding  Ramond Craver 01/25/2018,8:53 AM

## 2018-01-25 NOTE — Progress Notes (Signed)
At 2201, MT called this RN stating that patient's HR had increased to 190s and still elevated. When this RN entered room, patient was in bed with respiratory distress. HOB was already at 30 degrees d/t risk of aspiration. Patient stated that she had gotten "strangled", but no liquids were given to patient. Rhonchi audible, wheezes auscultated also. VS per flow sheet. Patient A&O to person only, same as shift assessment. Bodenheimer, NP was paged for notification. O2 2L [place] applied to patient. Metoprolol given IV per orders. EKG ordered. Larena Glassman, Charge RN notified. Olevia Bowens, MD was then notified. NRB mask was eventually applied to patient d/t poor SpO2 increase. Olevia Bowens, MD assessed patient. Lasix, Morphine, and Zofran given IV per orders. Nitro paste applied to patient's left shoulder. IV Mag started after lab draw per orders. Patient being prepared for transfer to ICU. Will continue to monitor.

## 2018-01-25 NOTE — Progress Notes (Signed)
Dr. Carles Collet notified of troponins as soon as results received today.  Troponins were 0.09 and 0.11

## 2018-01-25 NOTE — Plan of Care (Signed)
Patient is confused to place, time and situation.  She is cooperative but is unable to follow teaching.  She follows short, simple commands.

## 2018-01-25 NOTE — Progress Notes (Signed)
PT Cancellation Note  Patient Details Name: Lynn Weaver MRN: [id number] DOB: [birth year]   Cancelled Treatment:    Reason Eval/Treat Not Completed: Other (comment)(Patient transferred to the ICU from the third floor)  Reason Eval/Treat Not Completed: Medical issues which prohibited therapy (Pt transferred to ICU from 503, per [facility] policy pt has had a change in medical status and will require a new order for rehab services. Thank you for this referral. )  Clarene Critchley PT, DPT 9:19 AM, 01/25/18 [phone]

## 2018-01-25 NOTE — Progress Notes (Signed)
PROGRESS NOTE  Lynn Weaver ZHG:[id number] DOB: [birth year] DOA: 01/23/2018 PCP: Redmond School, MD  Brief History:  84 y.o.femalewith medical history ofcognitive impairment, hypertension, diverticulosis, systolic CHF, coronary artery disease presenting with4-day history of generalized weakness. At baseline, the patient is independent with all her ADLs although the patient's niece states that she does have some memory problems. In the past month, her niece has noted that the patient has had increasing gait instability. However, because of her worsening generalized weakness, the patient was brought to emergency department on 01/22/2018. Patient improved clinically after IV fluids in the emergency department. She was sent home with cephalexin for treatment of UTI. However, the patient continued to remain somnolent with worsening generalized weakness to the point she was not able to get out of bed on the morning of 01/23/2018. As result, the patient was brought back to the emergency department for further evaluation. Because of her somnolence and generalized weakness, the patient has had decreased oral intake.  In the emergency department, the patient was afebrile hemodynamically stable saturating 99% on room air. WBC was 12.2 with hemoglobin 11.7. BMP showed serum creatinine 1.00. Otherwise electro lites were unremarkable. Troponin was 0.06. Urinalysis was negative for pyuria. Chest x-ray was negative for any acute findings. CT of the brain was negative for acute findings.    Assessment/Plan: Acute metabolic encephalopathy -multifactorial including stroke, aspiration pneumonitis and dehydration -more alert  -Serum B12--220 -Check HDQQIWL--79 -Folic GXQJ--1.9 -ERD--4.081  Aspiration pneumonitis -The patient was noted to have a retained pill in her posterior oropharynx in ED -now with retained secretions in post oropharynx -Speech therapy evaluation -Continue  Unasyn  Dehydration -The patient is hemoconcentrated -Continue IV fluids  Pyuria -Continue unasyn pending urine culture data  Right MCA ischemic stroke -PT evaluation -Speech therapy eval--> revealed a large Zenker's diverticulum; dysphagia 3 diet -CT brain--neg for acute findings -MRI Brain in the right MCA territory -MRA brain--large vessel occlusion in the right MCA -After discussion with radiology and neurology, patient was not a candidate for invasive intervention as she was outside the window of time -Carotid Duplex--negative for hemodynamically significant stenosis -Echo--EF 55%, grade 1 DD, moderate MR/TR, PASP 83, no PFO -LDL--94 -HbA1C--5.6 -Antiplatelet--ASA supp  Paroxysmal atrial fibrillation with RVR -01/24/2018 evening--given metoprolol IV and started on diltiazem -Spontaneously converted back to sinus rhythm -Minimizing antihypertensive agents temporarily to allow for permissive hypertension -Case discussed with neurology, Dr. Bing Plume start anticoagulation 72 hours after onset of stroke symptoms -start apixaban  Essential Hypertension -allowing for permissive HTN -holding metoprolol and lisinopril  Elevated troponin -due to demand ischemia -repeat echo--EF 55%, grade 1 DD, moderate MR/TR, PASP 83, no PFO -cycle troponins--flat trend  Chronic systolic and diastolic CHF -Daily weights -11/18/2013 echo EF 20-25%, grade 1 DD  Hypokalemia -repleted -check mag 2.1  Goal of Care -DNR -palliative medicine consult -pt now with poor po intake and hypoglycemia   Disposition Plan:   SNF 1-2 days Family Communication:   Niece updated on phone  Consultants:  Palliative medicine  Code Status:  DNR  DVT Prophylaxis:  [place] Lovenox   Procedures: As Listed in Progress Note Above  Antibiotics: unasyn 7/4>>>       Subjective: Patient denies fevers, chills, headache, chest pain, dyspnea, nausea, vomiting, diarrhea, abdominal pain,  dysuria, hematuria, hematochezia, and melena.   Objective: Vitals:   01/25/18 0545 01/25/18 0600 01/25/18 0700 01/25/18 0756  BP: 115/68 (!) 85/51    Pulse:  74  62 63  Resp: 19 (!) 23 17 15   Temp:    (!) 97.3 F (36.3 C)  TempSrc:    Oral  SpO2:  99%  99%  Weight:      Height:        Intake/Output Summary (Last 24 hours) at 01/25/2018 0848 Last data filed at 01/25/2018 0533 Gross per 24 hour  Intake 1103.79 ml  Output 1750 ml  Net -646.21 ml   Weight change: 2.147 kg (4 lb 11.8 oz) Exam:   General:  Pt is alert, follows commands appropriately, not in acute distress  HEENT: No icterus, No thrush, No neck mass, [place]/AT  Cardiovascular: RRR, S1/S2, no rubs, no gallops  Respiratory: Bibasilar crackles but no wheezing.  Good abdomen.  Abdomen: Soft/+BS, non tender, non distended, no guarding  Extremities: No edema, No lymphangitis, No petechiae, No rashes, no synovitis  Neuro:  CN II-XII intact, strength 4/5 in RUE, RLE, strength 3+/5 LUE, LLE; sensation intact bilateral; no dysmetria;      Data Reviewed: I have personally reviewed following labs and imaging studies Basic Metabolic Panel: Recent Labs  Lab 01/22/18 1419 01/23/18 0934 01/24/18 0628 01/24/18 2244 01/24/18 2245 01/25/18 0451  NA 134* 136 140 135  --   --   K 4.2 4.4 3.1* 3.0*  --   --   CL 97* 102 106 104  --   --   CO2 26 25 22 22   --   --   GLUCOSE 95 85 59* 199*  --   --   BUN 16 15 13 11   --   --   CREATININE 1.20* 1.00 0.82 0.96  --   --   CALCIUM 9.4 8.9 8.4* 8.2*  --   --   MG  --   --   --  1.8  --  2.1  PHOS  --   --   --   --  2.9  --    Liver Function Tests: Recent Labs  Lab 01/22/18 1419  AST 40  ALT 20  ALKPHOS 65  BILITOT 1.3*  PROT 8.2*  ALBUMIN 3.8   No results for input(s): LIPASE, AMYLASE in the last 168 hours. Recent Labs  Lab 01/23/18 1345  AMMONIA 10   Coagulation Profile: No results for input(s): INR, PROTIME in the last 168 hours. CBC: Recent Labs  Lab  01/22/18 1419 01/23/18 0934 01/24/18 0628 01/24/18 2244  WBC 14.9* 12.2* 9.0 10.1  NEUTROABS 12.4* 9.9*  --  5.5  HGB 12.8 11.7* 10.5* 10.3*  HCT 38.5 35.0* 33.3* 33.0*  MCV 88.5 88.8 90.0 91.2  PLT 337 287 332 325   Cardiac Enzymes: Recent Labs  Lab 01/23/18 0934 01/23/18 1345 01/23/18 1905 01/24/18 2244 01/25/18 0451  TROPONINI 0.06* 0.06* 0.07* 0.13* 0.13*   BNP: Invalid input(s): POCBNP CBG: Recent Labs  Lab 01/22/18 1305 01/24/18 2238  GLUCAP 110* 206*   HbA1C: Recent Labs    01/23/18 1345  HGBA1C 5.6   Urine analysis:    Component Value Date/Time   COLORURINE AMBER (A) 01/23/2018 1001   APPEARANCEUR CLEAR 01/23/2018 1001   LABSPEC 1.016 01/23/2018 1001   PHURINE 5.0 01/23/2018 1001   GLUCOSEU NEGATIVE 01/23/2018 1001   HGBUR SMALL (A) 01/23/2018 1001   BILIRUBINUR NEGATIVE 01/23/2018 1001   KETONESUR 5 (A) 01/23/2018 1001   PROTEINUR NEGATIVE 01/23/2018 1001   UROBILINOGEN 0.2 11/17/2013 1123   NITRITE NEGATIVE 01/23/2018 1001   LEUKOCYTESUR NEGATIVE 01/23/2018 1001   Sepsis Labs: @LABRCNTIP (procalcitonin:4,lacticidven:4) )  Recent Results (from the past 240 hour(s))  MRSA PCR Screening     Status: None   Collection Time: 01/24/18 10:56 PM  Result Value Ref Range Status   MRSA by PCR NEGATIVE NEGATIVE Final    Comment:        The GeneXpert MRSA Assay (FDA approved for NASAL specimens only), is one component of a comprehensive MRSA colonization surveillance program. It is not intended to diagnose MRSA infection nor to guide or monitor treatment for MRSA infections. Performed at [facility], [address] [place], [place] [address]      Scheduled Meds: . aspirin EC  81 mg Oral Daily  . cyanocobalamin  1,000 mcg Intramuscular Daily  . enoxaparin (LOVENOX) injection  40 mg Subcutaneous Q24H  . mouth rinse  15 mL Mouth Rinse BID   Continuous Infusions: . albumin human    . ampicillin-sulbactam (UNASYN) IV Stopped (01/25/18 0141)  .  diltiazem (CARDIZEM) infusion Stopped (01/25/18 0326)    Procedures/Studies: Dg Chest 2 View  Result Date: 01/22/2018 CLINICAL DATA:  Loss of appetite, altered level of consciousness EXAM: CHEST - 2 VIEW COMPARISON:  11/29/2016 FINDINGS: The heart size and mediastinal contours are within normal limits. Both lungs are clear. The visualized skeletal structures are unremarkable. IMPRESSION: No active cardiopulmonary disease. Electronically Signed   By: Kathreen Devoid   On: 01/22/2018 13:33   Ct Head Wo Contrast  Result Date: 01/22/2018 CLINICAL DATA:  Altered mental status, dizziness and generalized weakness EXAM: CT HEAD WITHOUT CONTRAST TECHNIQUE: Contiguous axial images were obtained from the base of the skull through the vertex without intravenous contrast. COMPARISON:  Head CT 11/29/2016 FINDINGS: Brain: There is no mass, hemorrhage or extra-axial collection. There is generalized atrophy without lobar predilection. There is no acute or chronic infarction. There is hypoattenuation of the periventricular white matter, most commonly indicating chronic ischemic microangiopathy. Vascular: No abnormal hyperdensity of the major intracranial arteries or dural venous sinuses. No intracranial atherosclerosis. Skull: The visualized skull base, calvarium and extracranial soft tissues are normal. Sinuses/Orbits: No fluid levels or advanced mucosal thickening of the visualized paranasal sinuses. No mastoid or middle ear effusion. The orbits are normal. IMPRESSION: Generalized atrophy and chronic small vessel disease without acute intracranial abnormality. Electronically Signed   By: Ulyses Jarred M.D.   On: 01/22/2018 14:15   Mr Jodene Nam Head Wo Contrast  Addendum Date: 01/24/2018   ADDENDUM REPORT: 01/24/2018 12:43 ADDENDUM: Study discussed by telephone with Dr. Shanon Brow Markevius Trombetta on 01/24/2018 at 1235 hours. He advises that the patient initially presented on 01/22/2018, and therefore could be outside of the 24 hour window for  consideration of revascularization. Electronically Signed   By: Genevie Ann M.D.   On: 01/24/2018 12:43   Result Date: 01/24/2018 CLINICAL DATA:  84 year old female with dizziness, generalized weakness and altered mental status. EXAM: MRI HEAD WITHOUT CONTRAST MRA HEAD WITHOUT CONTRAST TECHNIQUE: Multiplanar, multiecho pulse sequences of the brain and surrounding structures were obtained without intravenous contrast. Angiographic images of the head were obtained using MRA technique without contrast. COMPARISON:  Head CT without contrast 01/22/2018 and earlier. FINDINGS: Study is intermittently degraded by motion artifact despite repeated imaging attempts. MRI HEAD FINDINGS Brain: Scattered small cortical and white matter infarcts are present throughout the middle right MCA territory. Operculum region cortex mostly is affected. Scattered white matter involvement from an area anterior to the insula as far back as the periatrial white matter. The lateral peri rolandic cortex is affected. The basal ganglia are relatively spared.  No contralateral or posterior fossa restricted diffusion. No intracranial hemorrhage identified. There is Patchy and confluent periventricular white matter T2 and FLAIR hyperintensity in both hemispheres, with asymmetric involvement of the anterior right temporal lobe white matter. There is a small area of chronic cortical encephalomalacia in the medial left occipital lobe. The deep gray matter nuclei and brainstem are normal for age. There are small chronic infarcts in the right cerebellum. No midline shift, mass effect, evidence of mass lesion, ventriculomegaly, extra-axial collection. Cervicomedullary junction and pituitary are within normal limits. Vascular: Major intracranial vascular flow voids are preserved. Skull and upper cervical spine: Negative visible cervical spine. Normal bone marrow signal. Sinuses/Orbits: Postoperative changes to the right globe. Otherwise negative orbits soft  tissues. Moderate maxillary sinus mucosal thickening. Other: Mastoid air cells are clear. Scalp and face soft tissues appear negative. MRA HEAD FINDINGS Antegrade flow in the posterior circulation with no vertebral or basilar artery stenosis. Patent PICA origins. Patent SCA and left PCA origins. Fetal type right PCA origin, the left posterior communicating artery is diminutive or absent. Symmetric PCA branch enhancement. Antegrade flow in both ICA siphons. No siphon stenosis. Patent carotid termini. Patent MCA and ACA origins. Visible ACA branches are within normal limits. There is mild to moderate irregularity and stenosis of the left MCA M1 segment. Visible left MCA branches are within normal limits. The right MCA M1 is patent proximally but there is a segment of absent flow at the right MCA bifurcation. The posterior right MCA division appears patent while right middle/anterior M2 branches are absent. IMPRESSION: 1. Evidence of acute Large Vessel Occlusion at the Right MCA bifurcation. Decreased/absent flow in the anterior and middle right MCA divisions. 2. Associated scattered acute small infarcts in the Right MCA territory, primarily the middle division. No associated hemorrhage or mass effect. 3. Mild or at most moderate left MCA M1 stenosis, no other intracranial large vessel abnormality. 4. Small chronic left PCA and right cerebellar territory infarcts. Moderately advanced chronic white matter disease. Electronically Signed: By: Genevie Ann M.D. On: 01/24/2018 12:31   Mr Brain Wo Contrast  Addendum Date: 01/24/2018   ADDENDUM REPORT: 01/24/2018 12:43 ADDENDUM: Study discussed by telephone with Dr. Shanon Brow Nikira Kushnir on 01/24/2018 at 1235 hours. He advises that the patient initially presented on 01/22/2018, and therefore could be outside of the 24 hour window for consideration of revascularization. Electronically Signed   By: Genevie Ann M.D.   On: 01/24/2018 12:43   Result Date: 01/24/2018 CLINICAL DATA:  84 year old  female with dizziness, generalized weakness and altered mental status. EXAM: MRI HEAD WITHOUT CONTRAST MRA HEAD WITHOUT CONTRAST TECHNIQUE: Multiplanar, multiecho pulse sequences of the brain and surrounding structures were obtained without intravenous contrast. Angiographic images of the head were obtained using MRA technique without contrast. COMPARISON:  Head CT without contrast 01/22/2018 and earlier. FINDINGS: Study is intermittently degraded by motion artifact despite repeated imaging attempts. MRI HEAD FINDINGS Brain: Scattered small cortical and white matter infarcts are present throughout the middle right MCA territory. Operculum region cortex mostly is affected. Scattered white matter involvement from an area anterior to the insula as far back as the periatrial white matter. The lateral peri rolandic cortex is affected. The basal ganglia are relatively spared. No contralateral or posterior fossa restricted diffusion. No intracranial hemorrhage identified. There is Patchy and confluent periventricular white matter T2 and FLAIR hyperintensity in both hemispheres, with asymmetric involvement of the anterior right temporal lobe white matter. There is a small area of chronic cortical encephalomalacia  in the medial left occipital lobe. The deep gray matter nuclei and brainstem are normal for age. There are small chronic infarcts in the right cerebellum. No midline shift, mass effect, evidence of mass lesion, ventriculomegaly, extra-axial collection. Cervicomedullary junction and pituitary are within normal limits. Vascular: Major intracranial vascular flow voids are preserved. Skull and upper cervical spine: Negative visible cervical spine. Normal bone marrow signal. Sinuses/Orbits: Postoperative changes to the right globe. Otherwise negative orbits soft tissues. Moderate maxillary sinus mucosal thickening. Other: Mastoid air cells are clear. Scalp and face soft tissues appear negative. MRA HEAD FINDINGS  Antegrade flow in the posterior circulation with no vertebral or basilar artery stenosis. Patent PICA origins. Patent SCA and left PCA origins. Fetal type right PCA origin, the left posterior communicating artery is diminutive or absent. Symmetric PCA branch enhancement. Antegrade flow in both ICA siphons. No siphon stenosis. Patent carotid termini. Patent MCA and ACA origins. Visible ACA branches are within normal limits. There is mild to moderate irregularity and stenosis of the left MCA M1 segment. Visible left MCA branches are within normal limits. The right MCA M1 is patent proximally but there is a segment of absent flow at the right MCA bifurcation. The posterior right MCA division appears patent while right middle/anterior M2 branches are absent. IMPRESSION: 1. Evidence of acute Large Vessel Occlusion at the Right MCA bifurcation. Decreased/absent flow in the anterior and middle right MCA divisions. 2. Associated scattered acute small infarcts in the Right MCA territory, primarily the middle division. No associated hemorrhage or mass effect. 3. Mild or at most moderate left MCA M1 stenosis, no other intracranial large vessel abnormality. 4. Small chronic left PCA and right cerebellar territory infarcts. Moderately advanced chronic white matter disease. Electronically Signed: By: Genevie Ann M.D. On: 01/24/2018 12:31   US Carotid Bilateral  Result Date: 01/24/2018 CLINICAL DATA:  84 year old female with dizziness, weakness and altered mental status as well as evidence of acute right EXAM: BILATERAL CAROTID DUPLEX ULTRASOUND TECHNIQUE: Pearline Cables scale imaging, color Doppler and duplex ultrasound were performed of bilateral carotid and vertebral arteries in the neck. COMPARISON:  None. FINDINGS: Criteria: Quantification of carotid stenosis is based on velocity parameters that correlate the residual internal carotid diameter with NASCET-based stenosis levels, using the diameter of the distal internal carotid lumen as  the denominator for stenosis measurement. The following velocity measurements were obtained: RIGHT ICA: 113/22 cm/sec CCA: 201/00 cm/sec SYSTOLIC ICA/CCA RATIO:  1.1 ECA:  85 cm/sec LEFT ICA: 149/26 cm/sec CCA: 71/21 cm/sec SYSTOLIC ICA/CCA RATIO:  1.6 ECA:  96 cm/sec RIGHT CAROTID ARTERY: Mild heterogeneous atherosclerotic plaque in the proximal internal carotid artery. By peak systolic velocity criteria, the stenosis remains less than 50%. RIGHT VERTEBRAL ARTERY:  Patent with normal antegrade flow. LEFT CAROTID ARTERY: Mild focal heterogeneous atherosclerotic plaque in the proximal internal carotid artery. By peak systolic velocity criteria, the estimated stenosis remains less than 50%. There is spurious elevation of the peak systolic velocity in the more distal internal carotid artery in a region of tortuosity. LEFT VERTEBRAL ARTERY:  Patent with normal antegrade flow. Other: Pulsus bisferious waveform. IMPRESSION: 1. Mild (1-49%) stenosis proximal right internal carotid artery secondary to heterogenous atherosclerotic plaque. 2. Mild (1-49%) stenosis proximal left internal carotid artery secondary to heterogenous atherosclerotic plaque. 3. Pulsus bisferious waveform suggests underlying aortic valvular disease such as aortic regurgitation or aortic stenosis with regurgitation. 4. Vertebral arteries are patent with antegrade flow. Signed, Criselda Peaches, MD [facility] Electronically  Signed   By: Jacqulynn Cadet M.D.   On: 01/24/2018 14:10   Dg Chest Port 1 View  Result Date: 01/24/2018 CLINICAL DATA:  Respiratory distress EXAM: PORTABLE CHEST 1 VIEW COMPARISON:  01/22/2018, 11/29/2016 FINDINGS: No acute airspace disease or pleural effusion. Mild cardiomegaly. No pneumothorax. IMPRESSION: No acute airspace disease. Mild cardiomegaly, increased compared to prior. Electronically Signed   By: Donavan Foil M.D.   On: 01/24/2018 22:47   Dg  Swallowing Func-speech Pathology  Result Date: 01/24/2018 Objective Swallowing Evaluation: Type of Study: MBS-Modified Barium Swallow Study  Patient Details Name: TAKEYLA MILLION MRN: [id number] Date of Birth: [birth year] Today's Date: 01/24/2018 Time: SLP Start Time (ACUTE ONLY): 1415 -SLP Stop Time (ACUTE ONLY): 1500 SLP Time Calculation (min) (ACUTE ONLY): 45 min Past Medical History: Past Medical History: Diagnosis Date . Anemia  . Diverticulosis  . GI bleed   diverticular/hemorrhoidal . Hemorrhoids  . Hypertension  Past Surgical History: Past Surgical History: Procedure Laterality Date . CATARACT EXTRACTION W/PHACO  05/29/2012  Procedure: CATARACT EXTRACTION PHACO AND INTRAOCULAR LENS PLACEMENT (IOC);  Surgeon: Tonny Branch, MD;  Location: [facility];  Service: Ophthalmology;  Laterality: Right;  CDE:18.72 . CHOLECYSTECTOMY   . COLONOSCOPY N/A 06/24/2013  Significant diverticular disease. Large hemorrhoids. Terminal ileum normal. 2 rectosigmoid polyps (TA) removed. No old or fresh blood noted during exam. Likely had bleeding from diverticula and hemorrhoids. consider flex sig in 3 years. Marland Kitchen LEFT AND RIGHT HEART CATHETERIZATION WITH CORONARY ANGIOGRAM N/A 11/23/2013  Procedure: LEFT AND RIGHT HEART CATHETERIZATION WITH CORONARY ANGIOGRAM;  Surgeon: Birdie Riddle, MD;  Location: [facility];  Service: Cardiovascular;  Laterality: N/A; HPI: 84 y.o.femalewith medical history ofcognitive impairment, hypertension, diverticulosis, systolic CHF, coronary artery disease presenting with4-day history of generalized weakness. At baseline, the patient is independent with all her ADLs although the patient's niece states that she does have some memory problems. In the past month, her niece has noted that the patient has had increasing gait instability. However, because of her worsening generalized weakness, the patient was brought to emergency department on 01/22/2018. Patient improved clinically after IV fluids in the emergency  department. She was sent home with cephalexin for treatment of UTI. However, the patient continued to remain somnolent with worsening generalized weakness to the point she was not able to get out of bed on the morning of 01/23/2018. As result, the patient was brought back to the emergency department for further evaluation. Because of her somnolence and generalized weakness, the patient has had decreased oral intake. In the emergency department, the patient was afebrile hemodynamically stable saturating 99% on room air. WBC was 12.2 with hemoglobin 11.7. BMP showed serum creatinine 1.00. Otherwise electro lites were unremarkable. Troponin was 0.06. Urinalysis was negative for pyuria. Chest x-ray was negative for any acute findings. CT of the brain was negative for acute findings. MRI shows: Evidence of acute Large Vessel Occlusion at the Right MCA bifurcation. Decreased/absent flow in the anterior and middle right MCA divisions. Associated scattered acute small infarcts in the Right MCA territory, primarily the middle division. No associated hemorrhage or mass effect. Mild or at most moderate left MCA M1 stenosis, no other intracranial large vessel abnormality. Small chronic left PCA and right cerebellar territory infarcts. Moderately advanced chronic white matter disease. BSE requested.  Subjective: "She's been holding phlegm in her mouth for a while now." Assessment / Plan / Recommendation CHL IP CLINICAL IMPRESSIONS 01/24/2018 Clinical Impression Pt presents with mild/mod oropharyngeal phase dysphagia and mod/severe pharyngoesophageal dysphagia  due to large outpouching (suspect Zenker's but no radiologist present to confirm). Oral phase is characterized by oral transit delays, reduced bolus cohesiveness and premature spillage into pharynx; pharyngeal phase is marked by delay in swallow initiation with swallow trigger after spilling to the pyriforms with liquids and reduced cricopharyngeal relaxation with  evidence of large outpouching suspicious for Zenker's diverticulum which collected thin, puree, and regular textures. Please see imaging tab for images of Zenker's. Pt protected airway reasonably well, but did have one episode of trace, silent aspiration of thin via straw sips. The presence of the large outpouching places Pt at significant risk of backflow into pharynx and eventual aspiration. Backflow from outpouching observed across consistencies and textures. Unfortunately, after discussion with MD, Pt is a poor candidate for Zenker's repair at this time given acute infarcts. Mrs. Holzworth is alert and responsive to questions today and tells her niece that she would not want a feeding tube and would like to eat by mouth. Recommend D3/mech soft with thin liquids via cup sips, avoid straws and encourage Pt to go slowly, take small bites, swallow several times for each swallow, and remain upright after meals for at least 30 minutes. Family understands that risk for aspiration is high at this time, in addition to dehydration/malnutrition. Recommend RD consult to assist with food choices. Suspect that Pt will tolerate liquids more efficiently than solids and would recommend crushing pills as able in puree. SLP will follow during acute stay. Above to RN and MD.   SLP Visit Diagnosis Dysphagia, pharyngoesophageal phase (R13.14);Dysphagia, oropharyngeal phase (R13.12) Attention and concentration deficit following -- Frontal lobe and executive function deficit following -- Impact on safety and function --   [facility] IP TREATMENT RECOMMENDATION 01/24/2018 Treatment Recommendations Therapy as outlined in treatment plan below   Prognosis 01/24/2018 Prognosis for Safe Diet Advancement Guarded Barriers to Reach Goals Severity of deficits Barriers/Prognosis Comment -- [facility] IP DIET RECOMMENDATION 01/24/2018 SLP Diet Recommendations Dysphagia 3 (Mech soft) solids;Thin liquid Liquid Administration via Cup Medication Administration Crushed with  puree Compensations Slow rate;Small sips/bites;Multiple dry swallows after each bite/sip Postural Changes Remain semi-upright after after feeds/meals (Comment);Seated upright at 90 degrees   [facility] IP OTHER RECOMMENDATIONS 01/24/2018 Recommended Consults Consider esophageal assessment;Consider ENT evaluation Oral Care Recommendations Oral care BID;Staff/trained caregiver to provide oral care Other Recommendations Clarify dietary restrictions   [facility] IP FOLLOW UP RECOMMENDATIONS 01/24/2018 Follow up Recommendations Skilled Nursing facility   [facility] IP FREQUENCY AND DURATION 01/24/2018 Speech Therapy Frequency (ACUTE ONLY) min 2x/week Treatment Duration 1 week      [facility] IP ORAL PHASE 01/24/2018 Oral Phase Impaired Oral - Pudding Teaspoon -- Oral - Pudding Cup -- Oral - Honey Teaspoon -- Oral - Honey Cup -- Oral - Nectar Teaspoon -- Oral - Nectar Cup -- Oral - Nectar Straw -- Oral - Thin Teaspoon Premature spillage;Decreased bolus cohesion Oral - Thin Cup Premature spillage;Decreased bolus cohesion Oral - Thin Straw Premature spillage Oral - Puree Lingual/palatal residue;Holding of bolus;Piecemeal swallowing;Delayed oral transit;Decreased bolus cohesion Oral - Mech Soft -- Oral - Regular Delayed oral transit Oral - Multi-Consistency -- Oral - Pill NT Oral Phase - Comment --  [facility] IP PHARYNGEAL PHASE 01/24/2018 Pharyngeal Phase Impaired Pharyngeal- Pudding Teaspoon -- Pharyngeal -- Pharyngeal- Pudding Cup -- Pharyngeal -- Pharyngeal- Honey Teaspoon -- Pharyngeal -- Pharyngeal- Honey Cup -- Pharyngeal -- Pharyngeal- Nectar Teaspoon -- Pharyngeal -- Pharyngeal- Nectar Cup -- Pharyngeal -- Pharyngeal- Nectar Straw -- Pharyngeal -- Pharyngeal- Thin Teaspoon Delayed swallow initiation-pyriform sinuses;Pharyngeal residue - pyriform;Pharyngeal  residue - cp segment Pharyngeal -- Pharyngeal- Thin Cup Delayed swallow initiation-pyriform sinuses;Penetration/Aspiration during swallow;Pharyngeal residue - pyriform;Pharyngeal residue - cp segment  Pharyngeal Material does not enter airway;Material enters airway, remains ABOVE vocal cords then ejected out Pharyngeal- Thin Straw Delayed swallow initiation-pyriform sinuses;Reduced airway/laryngeal closure;Penetration/Apiration after swallow;Trace aspiration;Pharyngeal residue - valleculae;Pharyngeal residue - pyriform;Pharyngeal residue - cp segment Pharyngeal Material does not enter airway;Material enters airway, remains ABOVE vocal cords then ejected out;Material enters airway, passes BELOW cords then ejected out Pharyngeal- Puree Delayed swallow initiation-vallecula;Pharyngeal residue - valleculae;Pharyngeal residue - posterior pharnyx;Pharyngeal residue - cp segment;Pharyngeal residue - pyriform Pharyngeal -- Pharyngeal- Mechanical Soft -- Pharyngeal -- Pharyngeal- Regular Pharyngeal residue - pyriform Pharyngeal -- Pharyngeal- Multi-consistency -- Pharyngeal -- Pharyngeal- Pill -- Pharyngeal -- Pharyngeal Comment Pharyngeal phase negatively impacted by suspected Zenker's diverticulum and backflow into pharynx  [facility]L IP CERVICAL ESOPHAGEAL PHASE 01/24/2018 Cervical Esophageal Phase Impaired Pudding Teaspoon -- Pudding Cup -- Honey Teaspoon -- Honey Cup -- Nectar Teaspoon -- Nectar Cup -- Nectar Straw -- Thin Teaspoon Reduced cricopharyngeal relaxation;Prominent cricopharyngeal segment;Esophageal backflow into the pharynx Thin Cup -- Thin Straw -- Puree -- Mechanical Soft -- Regular Reduced cricopharyngeal relaxation;Prominent cricopharyngeal segment;Esophageal backflow into the pharynx Multi-consistency -- Pill -- Cervical Esophageal Comment Please see imaging for substantial outpouching near UES (suspect Zenker's diverticulum) Thank you, Genene Churn, [address] No flowsheet data found. PORTER,DABNEY 01/24/2018, 4:29 PM               Orson Eva, DO  [facility] Pager [phone]  If 7PM-7AM, please contact night-coverage [email or web address] Password [email or web address] 01/25/2018, 8:48 AM   LOS: 2 days

## 2018-01-26 LAB — BASIC METABOLIC PANEL
Anion gap: 8 (ref 5–15)
BUN: 19 mg/dL (ref 8–23)
CALCIUM: 8.8 mg/dL — AB (ref 8.9–10.3)
CO2: 27 mmol/L (ref 22–32)
CREATININE: 1.04 mg/dL — AB (ref 0.44–1.00)
Chloride: 104 mmol/L (ref 98–111)
GFR calc non Af Amer: 46 mL/min — ABNORMAL LOW (ref >60)
GFR, EST AFRICAN AMERICAN: 53 mL/min — AB (ref >60)
Glucose, Bld: 111 mg/dL — ABNORMAL HIGH (ref 70–99)
Potassium: 4.2 mmol/L (ref 3.5–5.1)
SODIUM: 139 mmol/L (ref 135–145)

## 2018-01-26 LAB — CBC
HCT: 32.8 % — ABNORMAL LOW (ref 36.0–46.0)
Hemoglobin: 10.4 g/dL — ABNORMAL LOW (ref 12.0–15.0)
MCH: 28.5 pg (ref 26.0–34.0)
MCHC: 31.7 g/dL (ref 30.0–36.0)
MCV: 89.9 fL (ref 78.0–100.0)
PLATELETS: 352 10*3/uL (ref 150–400)
RBC: 3.65 MIL/uL — AB (ref 3.87–5.11)
RDW: 14.5 % (ref 11.5–15.5)
WBC: 7.8 10*3/uL (ref 4.0–10.5)

## 2018-01-26 MED ORDER — SENNA 8.6 MG PO TABS
2.0000 | ORAL_TABLET | Freq: Every day | ORAL | Status: DC
  Administered 2018-01-26: 17.2 mg via ORAL
  Filled 2018-01-26: qty 2

## 2018-01-26 MED ORDER — POLYETHYLENE GLYCOL 3350 17 G PO PACK
17.0000 g | PACK | Freq: Every day | ORAL | Status: DC
  Administered 2018-01-26: 17 g via ORAL
  Filled 2018-01-26: qty 1

## 2018-01-26 MED ORDER — VITAMIN B-12 1000 MCG PO TABS
500.0000 ug | ORAL_TABLET | Freq: Every day | ORAL | Status: DC
Start: 2018-01-26 — End: 2018-01-26

## 2018-01-26 MED ORDER — ASPIRIN 81 MG PO TBEC: 81.0000 mg | DELAYED_RELEASE_TABLET | Freq: Every day | ORAL | Status: AC

## 2018-01-26 MED ORDER — CYANOCOBALAMIN 500 MCG PO TABS: 500.0000 ug | ORAL_TABLET | Freq: Every day | ORAL | Status: AC

## 2018-01-26 MED ORDER — APIXABAN 2.5 MG PO TABS: 2.5000 mg | ORAL_TABLET | Freq: Two times a day (BID) | ORAL | 0 refills | Status: AC

## 2018-01-26 MED ORDER — VITAMIN B-12 1000 MCG PO TABS
500.0000 ug | ORAL_TABLET | Freq: Every day | ORAL | Status: DC
  Administered 2018-01-27: 500 ug via ORAL
  Filled 2018-01-26: qty 1

## 2018-01-26 NOTE — Evaluation (Addendum)
Physical Therapy Evaluation Patient Details Name: Lynn Weaver MRN: [id number] DOB: 03-27-1925 Today's Date: 01/26/2018   History of Present Illness  84 y.o. female with medical history of cognitive impairment, hypertension, diverticulosis, systolic CHF, coronary artery disease presenting with 4-day history of generalized weakness.  At baseline, the patient is independent with all her ADLs although the patient's niece states that she does have some memory problems.  In the past month, her niece has noted that the patient has had increasing gait instability.  However, because of her worsening generalized weakness, the patient was brought to emergency department on 01/22/2018.  Patient improved clinically after IV fluids in the emergency department.  She was sent home with cephalexin for treatment of UTI.  However, the patient continued to remain somnolent with worsening generalized weakness to the point she was not able to get out of bed on the morning of 01/23/2018.  As result, the patient was brought back to the emergency department for further evaluation.  Because of her somnolence and generalized weakness, the patient has had decreased oral intake.     Clinical Impression  Spoke to nurse before entering room, and she consented to having therapy work with patient. Patient was found awake and alert in bed upon entering. Patient's O2 saturation was found to be 96% before beginning therapy while  Patient was on 2 L O2 via nasal cannula. Patient's niece entered and was able to answer questions about patient's living situation and PLOF. Patient required moderate assistance to perform supine to sit and begin to scoot to the edge of the bed. Patient required assistance at her trunk to maintain sitting balance and at lower extremities to scoot to the edge. At this time, patient's O2 saturation began to decrease and reached 82%, therapist returned patient to laying in bed with the head of bed elevated and got the  nurse. After checking patient's oxygen saturation again while sitting up in bed it was 95%. Further therapy was held after this. Patient would benefit from skilled physical therapy as patient was independent prior to admission to the hospital and could benefit from skilled services to improve patient's strength, independence with transfers, balance, ambulation, and overall functional mobility.     Follow Up Recommendations SNF    Equipment Recommendations  Other (comment)(Determine at next venue of care)    Recommendations for Other Services       Precautions / Restrictions Precautions Precautions: Fall Restrictions Weight Bearing Restrictions: No      Mobility  Bed Mobility Overal bed mobility: Needs Assistance Bed Mobility: Supine to Sit     Supine to sit: Mod assist     General bed mobility comments: Patient required moderate assistance to perform supine to sit and to try to scoot to the edge of bed.   Transfers Overall transfer level: (Deferred due to safety)                  Ambulation/Gait                Stairs            Wheelchair Mobility    Modified Rankin (Stroke Patients Only)       Balance Overall balance assessment: Needs assistance Sitting-balance support: Bilateral upper extremity supported Sitting balance-Leahy Scale: Fair Sitting balance - Comments: Patient required assistance at posterior trunk to maintain sitting balance.  Pertinent Vitals/Pain Pain Assessment: No/denies pain    Home Living Family/patient expects to be discharged to:: Private residence Living Arrangements: Alone Available Help at Discharge: Family;Available PRN/intermittently(Niece) Type of Home: House Home Access: Stairs to enter Entrance Stairs-Rails: Right;Left;Can reach both Entrance Stairs-Number of Steps: 6 Home Layout: One level Home Equipment: Cane - single point      Prior Function Level  of Independence: Independent               Hand Dominance        Extremity/Trunk Assessment   Upper Extremity Assessment Upper Extremity Assessment: Defer to OT evaluation    Lower Extremity Assessment Lower Extremity Assessment: Difficult to assess due to impaired cognition;Generalized weakness(Difficult to assess as patient had difficulty to follow commands)    Cervical / Trunk Assessment Cervical / Trunk Assessment: Kyphotic  Communication   Communication: Other (comment)(Patient was able to state her name and date of birth, but otherwise was confused with responses)  Cognition Arousal/Alertness: Awake/alert Behavior During Therapy: WFL for tasks assessed/performed Overall Cognitive Status: History of cognitive impairments - at baseline                                 General Comments: Patient was able to state name and date of birth, but otherwise was confused.       General Comments      Exercises     Assessment/Plan    PT Assessment Patient needs continued PT services  PT Problem List Decreased strength;Decreased cognition;Decreased activity tolerance;Decreased balance;Decreased mobility       PT Treatment Interventions DME instruction;Gait training;Stair training;Functional mobility training;Therapeutic activities;Therapeutic exercise;Balance training;Neuromuscular re-education;Patient/family education    PT Goals (Current goals can be found in the Care Plan section)  Acute Rehab PT Goals Patient Stated Goal: To get therapy at a SNF before returning home.  PT Goal Formulation: With patient/family Time For Goal Achievement: 02/02/18 Potential to Achieve Goals: Fair    Frequency Min 6X/week   Barriers to discharge        Co-evaluation               AM-PAC PT "6 Clicks" Daily Activity  Outcome Measure Difficulty turning over in bed (including adjusting bedclothes, sheets and blankets)?: A Lot Difficulty moving from lying on  back to sitting on the side of the bed? : A Lot Difficulty sitting down on and standing up from a chair with arms (e.g., wheelchair, bedside commode, etc,.)?: A Lot Help needed moving to and from a bed to chair (including a wheelchair)?: A Lot Help needed walking in hospital room?: A Lot Help needed climbing 3-5 steps with a railing? : A Lot 6 Click Score: 12    End of Session Equipment Utilized During Treatment: Oxygen(2 LPM via nasal cannula) Activity Tolerance: Other (comment)(Patient limited by oxygen saturation which began to decrease once coming to the side of the bed. ) Patient left: in bed;with call bell/phone within reach;with family/visitor present Nurse Communication: Mobility status PT Visit Diagnosis: Other abnormalities of gait and mobility (R26.89);Muscle weakness (generalized) (M62.81);Repeated falls (R29.6)    Time: 0840-0900 PT Time Calculation (min) (ACUTE ONLY): 20 min   Charges:   PT Evaluation $PT Eval Low Complexity: 1 Low     PT G Codes:       Clarene Critchley PT, DPT 9:17 AM, 01/26/18 [phone]

## 2018-01-26 NOTE — Progress Notes (Signed)
Dr. Carles Collet notified of burst of Vtach this morning and non-sustained atrial fib with RVR topping out at 179.  HR 83 now.  Patient had no complaints of discomfort or other symptoms.

## 2018-01-26 NOTE — Discharge Summary (Signed)
Physician Discharge Summary  Lynn Weaver OFB:[id number] DOB: 08-Apr-1925 DOA: 01/23/2018  PCP: Redmond School, MD  Admit date: 01/23/2018 Discharge date: 01/27/2018  Admitted From: Home Disposition:  SNF  Recommendations for Outpatient Follow-up:  1. Follow up with PCP in 1-2 weeks 2. Please obtain BMP/CBC in one week    Discharge Condition: Stable CODE STATUS: DNR Diet recommendation:  Dysphagia 3 with thin liquids   Brief/Interim Summary: 84 y.o.femalewith medical history ofcognitive impairment, hypertension, diverticulosis, systolic CHF, coronary artery disease presenting with4-day history of generalized weakness. At baseline, the patient is independent with all her ADLs although the patient's niece states that she does have some memory problems. In the past month, her niece has noted that the patient has had increasing gait instability. However, because of her worsening generalized weakness, the patient was brought to emergency department on 01/22/2018. Patient improved clinically after IV fluids in the emergency department. She was sent home with cephalexin for treatment of UTI. However, the patient continued to remain somnolent with worsening generalized weakness to the point she was not able to get out of bed on the morning of 01/23/2018. As result, the patient was brought back to the emergency department for further evaluation. Because of her somnolence and generalized weakness, the patient has had decreased oral intake.  In the emergency department, the patient was afebrile hemodynamically stable saturating 99% on room air. WBC was 12.2 with hemoglobin 11.7. BMP showed serum creatinine 1.00. Otherwise electro lites were unremarkable. Troponin was 0.06. Urinalysis was negative for pyuria. Chest x-ray was negative for any acute findings. CT of the brain was negative for acute findings.  Ultimately, MRI of the brain was obtained and showed scattered infarcts in the R-MCA  territory.      Discharge Diagnoses:  Acute metabolic encephalopathy -multifactorial includingstroke, aspiration pneumonitisand dehydration -at baseline at time of discharge, pleasantly confused -Serum B12--220-->started supplementation -Check POEUMPN--36 -Folic RWER--1.5 -QMG--8.676  Aspiration pneumonitis -The patient was noted to have a retained pill in her posterior oropharynxin ED -now with retained secretions in post oropharynx -Speech therapy evaluation-->dysphagia 3 diet with thin diet -ContinueUnasyn>>>amox/clav x 2 more days to complete 1 week of tx  Zenker's Diverticulum -not a candidate for surgery presently -speech to help with mitigating strategies  Dehydration -The patient was hemoconcentrated -Continue IV fluids -improved  Right MCA ischemic stroke -PT evaluation-->SNF -Speech therapy eval-->revealed a large Zenker's diverticulum; dysphagia 3 diet -CT brain--neg for acute findings -MRIBrain--scattered acute infarctsin the right MCA territory -MRAbrain--large vessel occlusion in the right MCA -After discussion with radiology and neurology, patient was not a candidate for invasive intervention as she was outside the window of time -Carotid Duplex--negative for hemodynamically significant stenosis -Echo--EF 55%, grade 1 DD, moderate MR/TR, PASP 83, no PFO -LDL--94 -HbA1C--5.6 -Antiplatelet--ASA supp -start statin  Paroxysmal atrial fibrillation with RVR -01/24/2018 evening--given metoprolol IV and started on diltiazem -pt continues to go in and out of Afib intermittently -Minimizing antihypertensive agents temporarily to allow for permissive hypertension initially -Case discussed with neurology, Dr. Bing Plume start anticoagulation 72 hours after onset of stroke symptoms -started apixaban 7/6 -restart metoprolol tartrate 12.5 mg bid  Essential Hypertension -allowing for permissive HTN -holding metoprolol initially to allow for permissive  hypertension -restart metoprolol tartrate  Elevated troponin -due to demand ischemia -repeat echo--EF 55%, grade 1 DD, moderate MR/TR, PASP 83, no PFO -cycle troponins--flat trend  Chronic systolic and diastolic CHF -Daily weights -11/18/2013 echo EF 20-25%, grade 1 DD -01/23/18-Echo--EF 55%, grade 1 DD, moderate MR/TR, PASP 83,  no PFO -holding lisinopril temporarily allowing for permissive HTN  Hypokalemia -repleted -check mag2.1  Goal of Care -DNR -palliative medicine consult -pt now with poor po intake and hypoglycemia     Discharge Instructions   Allergies as of 01/27/2018      Reactions   Fish Allergy Hives      Medication List    STOP taking these medications   cephALEXin 500 MG capsule Commonly known as:  KEFLEX   lisinopril 10 MG tablet Commonly known as:  PRINIVIL,ZESTRIL   meclizine 25 MG tablet Commonly known as:  ANTIVERT     TAKE these medications   albuterol (2.5 MG/3ML) 0.083% nebulizer solution Commonly known as:  PROVENTIL Take 2.5 mg by nebulization every 6 (six) hours as needed for wheezing or shortness of breath.   albuterol 108 (90 Base) MCG/ACT inhaler Commonly known as:  PROVENTIL HFA;VENTOLIN HFA Inhale 1-2 puffs into the lungs every 6 (six) hours as needed for wheezing or shortness of breath.   amoxicillin-clavulanate 875-125 MG tablet Commonly known as:  AUGMENTIN Take 1 tablet by mouth every 12 (twelve) hours.   apixaban 2.5 MG Tabs tablet Commonly known as:  ELIQUIS Take 1 tablet (2.5 mg total) by mouth 2 (two) times daily.   aspirin 81 MG EC tablet Take 1 tablet (81 mg total) by mouth daily.   atorvastatin 20 MG tablet Commonly known as:  LIPITOR Take 1 tablet (20 mg total) by mouth daily at 6 PM.   metoprolol tartrate 12.5 mg Tabs tablet Commonly known as:  LOPRESSOR Take 0.5 tablets (12.5 mg total) by mouth 2 (two) times daily.   potassium chloride 10 MEQ tablet Commonly known as:  K-DUR,KLOR-CON Take 1 tablet  (10 mEq total) by mouth 2 (two) times daily. What changed:    medication strength  how much to take   vitamin B-12 500 MCG tablet Commonly known as:  CYANOCOBALAMIN Take 1 tablet (500 mcg total) by mouth daily.       Allergies  Allergen Reactions  . Fish Allergy Hives    Consultations:  neurology   Procedures/Studies: Dg Chest 2 View  Result Date: 01/22/2018 CLINICAL DATA:  Loss of appetite, altered level of consciousness EXAM: CHEST - 2 VIEW COMPARISON:  11/29/2016 FINDINGS: The heart size and mediastinal contours are within normal limits. Both lungs are clear. The visualized skeletal structures are unremarkable. IMPRESSION: No active cardiopulmonary disease. Electronically Signed   By: Kathreen Devoid   On: 01/22/2018 13:33   Ct Head Wo Contrast  Result Date: 01/22/2018 CLINICAL DATA:  Altered mental status, dizziness and generalized weakness EXAM: CT HEAD WITHOUT CONTRAST TECHNIQUE: Contiguous axial images were obtained from the base of the skull through the vertex without intravenous contrast. COMPARISON:  Head CT 11/29/2016 FINDINGS: Brain: There is no mass, hemorrhage or extra-axial collection. There is generalized atrophy without lobar predilection. There is no acute or chronic infarction. There is hypoattenuation of the periventricular white matter, most commonly indicating chronic ischemic microangiopathy. Vascular: No abnormal hyperdensity of the major intracranial arteries or dural venous sinuses. No intracranial atherosclerosis. Skull: The visualized skull base, calvarium and extracranial soft tissues are normal. Sinuses/Orbits: No fluid levels or advanced mucosal thickening of the visualized paranasal sinuses. No mastoid or middle ear effusion. The orbits are normal. IMPRESSION: Generalized atrophy and chronic small vessel disease without acute intracranial abnormality. Electronically Signed   By: Ulyses Jarred M.D.   On: 01/22/2018 14:15   Mr Jodene Nam Head Wo Contrast  Addendum  Date: 01/24/2018  ADDENDUM REPORT: 01/24/2018 12:43 ADDENDUM: Study discussed by telephone with Dr. Shanon Brow Hanaa Payes on 01/24/2018 at 1235 hours. He advises that the patient initially presented on 01/22/2018, and therefore could be outside of the 24 hour window for consideration of revascularization. Electronically Signed   By: Genevie Ann M.D.   On: 01/24/2018 12:43   Result Date: 01/24/2018 CLINICAL DATA:  84 year old female with dizziness, generalized weakness and altered mental status. EXAM: MRI HEAD WITHOUT CONTRAST MRA HEAD WITHOUT CONTRAST TECHNIQUE: Multiplanar, multiecho pulse sequences of the brain and surrounding structures were obtained without intravenous contrast. Angiographic images of the head were obtained using MRA technique without contrast. COMPARISON:  Head CT without contrast 01/22/2018 and earlier. FINDINGS: Study is intermittently degraded by motion artifact despite repeated imaging attempts. MRI HEAD FINDINGS Brain: Scattered small cortical and white matter infarcts are present throughout the middle right MCA territory. Operculum region cortex mostly is affected. Scattered white matter involvement from an area anterior to the insula as far back as the periatrial white matter. The lateral peri rolandic cortex is affected. The basal ganglia are relatively spared. No contralateral or posterior fossa restricted diffusion. No intracranial hemorrhage identified. There is Patchy and confluent periventricular white matter T2 and FLAIR hyperintensity in both hemispheres, with asymmetric involvement of the anterior right temporal lobe white matter. There is a small area of chronic cortical encephalomalacia in the medial left occipital lobe. The deep gray matter nuclei and brainstem are normal for age. There are small chronic infarcts in the right cerebellum. No midline shift, mass effect, evidence of mass lesion, ventriculomegaly, extra-axial collection. Cervicomedullary junction and pituitary are within normal  limits. Vascular: Major intracranial vascular flow voids are preserved. Skull and upper cervical spine: Negative visible cervical spine. Normal bone marrow signal. Sinuses/Orbits: Postoperative changes to the right globe. Otherwise negative orbits soft tissues. Moderate maxillary sinus mucosal thickening. Other: Mastoid air cells are clear. Scalp and face soft tissues appear negative. MRA HEAD FINDINGS Antegrade flow in the posterior circulation with no vertebral or basilar artery stenosis. Patent PICA origins. Patent SCA and left PCA origins. Fetal type right PCA origin, the left posterior communicating artery is diminutive or absent. Symmetric PCA branch enhancement. Antegrade flow in both ICA siphons. No siphon stenosis. Patent carotid termini. Patent MCA and ACA origins. Visible ACA branches are within normal limits. There is mild to moderate irregularity and stenosis of the left MCA M1 segment. Visible left MCA branches are within normal limits. The right MCA M1 is patent proximally but there is a segment of absent flow at the right MCA bifurcation. The posterior right MCA division appears patent while right middle/anterior M2 branches are absent. IMPRESSION: 1. Evidence of acute Large Vessel Occlusion at the Right MCA bifurcation. Decreased/absent flow in the anterior and middle right MCA divisions. 2. Associated scattered acute small infarcts in the Right MCA territory, primarily the middle division. No associated hemorrhage or mass effect. 3. Mild or at most moderate left MCA M1 stenosis, no other intracranial large vessel abnormality. 4. Small chronic left PCA and right cerebellar territory infarcts. Moderately advanced chronic white matter disease. Electronically Signed: By: Genevie Ann M.D. On: 01/24/2018 12:31   Mr Brain Wo Contrast  Addendum Date: 01/24/2018   ADDENDUM REPORT: 01/24/2018 12:43 ADDENDUM: Study discussed by telephone with Dr. Shanon Brow Jacquelyn Antony on 01/24/2018 at 1235 hours. He advises that the patient  initially presented on 01/22/2018, and therefore could be outside of the 24 hour window for consideration of revascularization. Electronically Signed   By:  Genevie Ann M.D.   On: 01/24/2018 12:43   Result Date: 01/24/2018 CLINICAL DATA:  84 year old female with dizziness, generalized weakness and altered mental status. EXAM: MRI HEAD WITHOUT CONTRAST MRA HEAD WITHOUT CONTRAST TECHNIQUE: Multiplanar, multiecho pulse sequences of the brain and surrounding structures were obtained without intravenous contrast. Angiographic images of the head were obtained using MRA technique without contrast. COMPARISON:  Head CT without contrast 01/22/2018 and earlier. FINDINGS: Study is intermittently degraded by motion artifact despite repeated imaging attempts. MRI HEAD FINDINGS Brain: Scattered small cortical and white matter infarcts are present throughout the middle right MCA territory. Operculum region cortex mostly is affected. Scattered white matter involvement from an area anterior to the insula as far back as the periatrial white matter. The lateral peri rolandic cortex is affected. The basal ganglia are relatively spared. No contralateral or posterior fossa restricted diffusion. No intracranial hemorrhage identified. There is Patchy and confluent periventricular white matter T2 and FLAIR hyperintensity in both hemispheres, with asymmetric involvement of the anterior right temporal lobe white matter. There is a small area of chronic cortical encephalomalacia in the medial left occipital lobe. The deep gray matter nuclei and brainstem are normal for age. There are small chronic infarcts in the right cerebellum. No midline shift, mass effect, evidence of mass lesion, ventriculomegaly, extra-axial collection. Cervicomedullary junction and pituitary are within normal limits. Vascular: Major intracranial vascular flow voids are preserved. Skull and upper cervical spine: Negative visible cervical spine. Normal bone marrow signal.  Sinuses/Orbits: Postoperative changes to the right globe. Otherwise negative orbits soft tissues. Moderate maxillary sinus mucosal thickening. Other: Mastoid air cells are clear. Scalp and face soft tissues appear negative. MRA HEAD FINDINGS Antegrade flow in the posterior circulation with no vertebral or basilar artery stenosis. Patent PICA origins. Patent SCA and left PCA origins. Fetal type right PCA origin, the left posterior communicating artery is diminutive or absent. Symmetric PCA branch enhancement. Antegrade flow in both ICA siphons. No siphon stenosis. Patent carotid termini. Patent MCA and ACA origins. Visible ACA branches are within normal limits. There is mild to moderate irregularity and stenosis of the left MCA M1 segment. Visible left MCA branches are within normal limits. The right MCA M1 is patent proximally but there is a segment of absent flow at the right MCA bifurcation. The posterior right MCA division appears patent while right middle/anterior M2 branches are absent. IMPRESSION: 1. Evidence of acute Large Vessel Occlusion at the Right MCA bifurcation. Decreased/absent flow in the anterior and middle right MCA divisions. 2. Associated scattered acute small infarcts in the Right MCA territory, primarily the middle division. No associated hemorrhage or mass effect. 3. Mild or at most moderate left MCA M1 stenosis, no other intracranial large vessel abnormality. 4. Small chronic left PCA and right cerebellar territory infarcts. Moderately advanced chronic white matter disease. Electronically Signed: By: Genevie Ann M.D. On: 01/24/2018 12:31   US Carotid Bilateral  Result Date: 01/24/2018 CLINICAL DATA:  84 year old female with dizziness, weakness and altered mental status as well as evidence of acute right EXAM: BILATERAL CAROTID DUPLEX ULTRASOUND TECHNIQUE: Pearline Cables scale imaging, color Doppler and duplex ultrasound were performed of bilateral carotid and vertebral arteries in the neck. COMPARISON:   None. FINDINGS: Criteria: Quantification of carotid stenosis is based on velocity parameters that correlate the residual internal carotid diameter with NASCET-based stenosis levels, using the diameter of the distal internal carotid lumen as the denominator for stenosis measurement. The following velocity measurements were obtained: RIGHT ICA: 113/22 cm/sec CCA:  536/64 cm/sec SYSTOLIC ICA/CCA RATIO:  1.1 ECA:  85 cm/sec LEFT ICA: 149/26 cm/sec CCA: 40/34 cm/sec SYSTOLIC ICA/CCA RATIO:  1.6 ECA:  96 cm/sec RIGHT CAROTID ARTERY: Mild heterogeneous atherosclerotic plaque in the proximal internal carotid artery. By peak systolic velocity criteria, the stenosis remains less than 50%. RIGHT VERTEBRAL ARTERY:  Patent with normal antegrade flow. LEFT CAROTID ARTERY: Mild focal heterogeneous atherosclerotic plaque in the proximal internal carotid artery. By peak systolic velocity criteria, the estimated stenosis remains less than 50%. There is spurious elevation of the peak systolic velocity in the more distal internal carotid artery in a region of tortuosity. LEFT VERTEBRAL ARTERY:  Patent with normal antegrade flow. Other: Pulsus bisferious waveform. IMPRESSION: 1. Mild (1-49%) stenosis proximal right internal carotid artery secondary to heterogenous atherosclerotic plaque. 2. Mild (1-49%) stenosis proximal left internal carotid artery secondary to heterogenous atherosclerotic plaque. 3. Pulsus bisferious waveform suggests underlying aortic valvular disease such as aortic regurgitation or aortic stenosis with regurgitation. 4. Vertebral arteries are patent with antegrade flow. Signed, Criselda Peaches, MD [facility] Electronically Signed   By: Jacqulynn Cadet M.D.   On: 01/24/2018 14:10   Dg Chest Port 1 View  Result Date: 01/24/2018 CLINICAL DATA:  Respiratory distress EXAM: PORTABLE CHEST 1 VIEW COMPARISON:  01/22/2018, 11/29/2016 FINDINGS: No acute  airspace disease or pleural effusion. Mild cardiomegaly. No pneumothorax. IMPRESSION: No acute airspace disease. Mild cardiomegaly, increased compared to prior. Electronically Signed   By: Donavan Foil M.D.   On: 01/24/2018 22:47   Dg Swallowing Func-speech Pathology  Result Date: 01/24/2018 Objective Swallowing Evaluation: Type of Study: MBS-Modified Barium Swallow Study  Patient Details Name: ODEAL WELDEN MRN: [id number] Date of Birth: 1924-12-14 Today's Date: 01/24/2018 Time: SLP Start Time (ACUTE ONLY): 1415 -SLP Stop Time (ACUTE ONLY): 1500 SLP Time Calculation (min) (ACUTE ONLY): 45 min Past Medical History: Past Medical History: Diagnosis Date . Anemia  . Diverticulosis  . GI bleed   diverticular/hemorrhoidal . Hemorrhoids  . Hypertension  Past Surgical History: Past Surgical History: Procedure Laterality Date . CATARACT EXTRACTION W/PHACO  05/29/2012  Procedure: CATARACT EXTRACTION PHACO AND INTRAOCULAR LENS PLACEMENT (IOC);  Surgeon: Tonny Branch, MD;  Location: [facility];  Service: Ophthalmology;  Laterality: Right;  CDE:18.72 . CHOLECYSTECTOMY   . COLONOSCOPY N/A 06/24/2013  Significant diverticular disease. Large hemorrhoids. Terminal ileum normal. 2 rectosigmoid polyps (TA) removed. No old or fresh blood noted during exam. Likely had bleeding from diverticula and hemorrhoids. consider flex sig in 3 years. Marland Kitchen LEFT AND RIGHT HEART CATHETERIZATION WITH CORONARY ANGIOGRAM N/A 11/23/2013  Procedure: LEFT AND RIGHT HEART CATHETERIZATION WITH CORONARY ANGIOGRAM;  Surgeon: Birdie Riddle, MD;  Location: [facility];  Service: Cardiovascular;  Laterality: N/A; HPI: 84 y.o.femalewith medical history ofcognitive impairment, hypertension, diverticulosis, systolic CHF, coronary artery disease presenting with4-day history of generalized weakness. At baseline, the patient is independent with all her ADLs although the patient's niece states that she does have some memory problems. In the past month, her niece has noted  that the patient has had increasing gait instability. However, because of her worsening generalized weakness, the patient was brought to emergency department on 01/22/2018. Patient improved clinically after IV fluids in the emergency department. She was sent home with cephalexin for treatment of UTI. However, the patient continued to remain somnolent with worsening generalized weakness to the point she was not able to get out of bed on the morning of 01/23/2018. As result, the patient was brought back  to the emergency department for further evaluation. Because of her somnolence and generalized weakness, the patient has had decreased oral intake. In the emergency department, the patient was afebrile hemodynamically stable saturating 99% on room air. WBC was 12.2 with hemoglobin 11.7. BMP showed serum creatinine 1.00. Otherwise electro lites were unremarkable. Troponin was 0.06. Urinalysis was negative for pyuria. Chest x-ray was negative for any acute findings. CT of the brain was negative for acute findings. MRI shows: Evidence of acute Large Vessel Occlusion at the Right MCA bifurcation. Decreased/absent flow in the anterior and middle right MCA divisions. Associated scattered acute small infarcts in the Right MCA territory, primarily the middle division. No associated hemorrhage or mass effect. Mild or at most moderate left MCA M1 stenosis, no other intracranial large vessel abnormality. Small chronic left PCA and right cerebellar territory infarcts. Moderately advanced chronic white matter disease. BSE requested.  Subjective: "She's been holding phlegm in her mouth for a while now." Assessment / Plan / Recommendation CHL IP CLINICAL IMPRESSIONS 01/24/2018 Clinical Impression Pt presents with mild/mod oropharyngeal phase dysphagia and mod/severe pharyngoesophageal dysphagia due to large outpouching (suspect Zenker's but no radiologist present to confirm). Oral phase is characterized by oral transit delays,  reduced bolus cohesiveness and premature spillage into pharynx; pharyngeal phase is marked by delay in swallow initiation with swallow trigger after spilling to the pyriforms with liquids and reduced cricopharyngeal relaxation with evidence of large outpouching suspicious for Zenker's diverticulum which collected thin, puree, and regular textures. Please see imaging tab for images of Zenker's. Pt protected airway reasonably well, but did have one episode of trace, silent aspiration of thin via straw sips. The presence of the large outpouching places Pt at significant risk of backflow into pharynx and eventual aspiration. Backflow from outpouching observed across consistencies and textures. Unfortunately, after discussion with MD, Pt is a poor candidate for Zenker's repair at this time given acute infarcts. Mrs. Ungerer is alert and responsive to questions today and tells her niece that she would not want a feeding tube and would like to eat by mouth. Recommend D3/mech soft with thin liquids via cup sips, avoid straws and encourage Pt to go slowly, take small bites, swallow several times for each swallow, and remain upright after meals for at least 30 minutes. Family understands that risk for aspiration is high at this time, in addition to dehydration/malnutrition. Recommend RD consult to assist with food choices. Suspect that Pt will tolerate liquids more efficiently than solids and would recommend crushing pills as able in puree. SLP will follow during acute stay. Above to RN and MD.   SLP Visit Diagnosis Dysphagia, pharyngoesophageal phase (R13.14);Dysphagia, oropharyngeal phase (R13.12) Attention and concentration deficit following -- Frontal lobe and executive function deficit following -- Impact on safety and function --   CHL IP TREATMENT RECOMMENDATION 01/24/2018 Treatment Recommendations Therapy as outlined in treatment plan below   Prognosis 01/24/2018 Prognosis for Safe Diet Advancement Guarded Barriers to Reach  Goals Severity of deficits Barriers/Prognosis Comment -- CHL IP DIET RECOMMENDATION 01/24/2018 SLP Diet Recommendations Dysphagia 3 (Mech soft) solids;Thin liquid Liquid Administration via Cup Medication Administration Crushed with puree Compensations Slow rate;Small sips/bites;Multiple dry swallows after each bite/sip Postural Changes Remain semi-upright after after feeds/meals (Comment);Seated upright at 90 degrees   CHL IP OTHER RECOMMENDATIONS 01/24/2018 Recommended Consults Consider esophageal assessment;Consider ENT evaluation Oral Care Recommendations Oral care BID;Staff/trained caregiver to provide oral care Other Recommendations Clarify dietary restrictions   [facility] IP FOLLOW UP RECOMMENDATIONS 01/24/2018 Follow up Recommendations Skilled  Nursing facility   [facility] IP FREQUENCY AND DURATION 01/24/2018 Speech Therapy Frequency (ACUTE ONLY) min 2x/week Treatment Duration 1 week      [facility] IP ORAL PHASE 01/24/2018 Oral Phase Impaired Oral - Pudding Teaspoon -- Oral - Pudding Cup -- Oral - Honey Teaspoon -- Oral - Honey Cup -- Oral - Nectar Teaspoon -- Oral - Nectar Cup -- Oral - Nectar Straw -- Oral - Thin Teaspoon Premature spillage;Decreased bolus cohesion Oral - Thin Cup Premature spillage;Decreased bolus cohesion Oral - Thin Straw Premature spillage Oral - Puree Lingual/palatal residue;Holding of bolus;Piecemeal swallowing;Delayed oral transit;Decreased bolus cohesion Oral - Mech Soft -- Oral - Regular Delayed oral transit Oral - Multi-Consistency -- Oral - Pill NT Oral Phase - Comment --  [facility] IP PHARYNGEAL PHASE 01/24/2018 Pharyngeal Phase Impaired Pharyngeal- Pudding Teaspoon -- Pharyngeal -- Pharyngeal- Pudding Cup -- Pharyngeal -- Pharyngeal- Honey Teaspoon -- Pharyngeal -- Pharyngeal- Honey Cup -- Pharyngeal -- Pharyngeal- Nectar Teaspoon -- Pharyngeal -- Pharyngeal- Nectar Cup -- Pharyngeal -- Pharyngeal- Nectar Straw -- Pharyngeal -- Pharyngeal- Thin Teaspoon Delayed swallow initiation-pyriform sinuses;Pharyngeal  residue - pyriform;Pharyngeal residue - cp segment Pharyngeal -- Pharyngeal- Thin Cup Delayed swallow initiation-pyriform sinuses;Penetration/Aspiration during swallow;Pharyngeal residue - pyriform;Pharyngeal residue - cp segment Pharyngeal Material does not enter airway;Material enters airway, remains ABOVE vocal cords then ejected out Pharyngeal- Thin Straw Delayed swallow initiation-pyriform sinuses;Reduced airway/laryngeal closure;Penetration/Apiration after swallow;Trace aspiration;Pharyngeal residue - valleculae;Pharyngeal residue - pyriform;Pharyngeal residue - cp segment Pharyngeal Material does not enter airway;Material enters airway, remains ABOVE vocal cords then ejected out;Material enters airway, passes BELOW cords then ejected out Pharyngeal- Puree Delayed swallow initiation-vallecula;Pharyngeal residue - valleculae;Pharyngeal residue - posterior pharnyx;Pharyngeal residue - cp segment;Pharyngeal residue - pyriform Pharyngeal -- Pharyngeal- Mechanical Soft -- Pharyngeal -- Pharyngeal- Regular Pharyngeal residue - pyriform Pharyngeal -- Pharyngeal- Multi-consistency -- Pharyngeal -- Pharyngeal- Pill -- Pharyngeal -- Pharyngeal Comment Pharyngeal phase negatively impacted by suspected Zenker's diverticulum and backflow into pharynx  [facility] IP CERVICAL ESOPHAGEAL PHASE 01/24/2018 Cervical Esophageal Phase Impaired Pudding Teaspoon -- Pudding Cup -- Honey Teaspoon -- Honey Cup -- Nectar Teaspoon -- Nectar Cup -- Nectar Straw -- Thin Teaspoon Reduced cricopharyngeal relaxation;Prominent cricopharyngeal segment;Esophageal backflow into the pharynx Thin Cup -- Thin Straw -- Puree -- Mechanical Soft -- Regular Reduced cricopharyngeal relaxation;Prominent cricopharyngeal segment;Esophageal backflow into the pharynx Multi-consistency -- Pill -- Cervical Esophageal Comment Please see imaging for substantial outpouching near UES (suspect Zenker's diverticulum) Thank you, Genene Churn, [place] No flowsheet  data found. PORTER,DABNEY 01/24/2018, 4:29 PM                   Discharge Exam: Vitals:   01/27/18 0600 01/27/18 0700  BP: (!) 173/92 (!) 169/92  Pulse: 85 67  Resp: 15 15  Temp:    SpO2: 93% 97%   Vitals:   01/27/18 0400 01/27/18 0500 01/27/18 0600 01/27/18 0700  BP: (!) 164/64 (!) 165/60 (!) 173/92 (!) 169/92  Pulse: 67 61 85 67  Resp: 12 12 15 15   Temp: 97.6 F (36.4 C)     TempSrc: Axillary     SpO2: 93% 98% 93% 97%  Weight: 47.2 kg (104 lb 0.9 oz)     Height:        General: Pt is alert, awake, not in acute distress Cardiovascular: RRR, S1/S2 +, no rubs, no gallops Respiratory: CTA bilaterally, no wheezing, no rhonchi Abdominal: Soft, NT, ND, bowel sounds + Extremities: no edema, no cyanosis   The results of significant diagnostics from this hospitalization (including imaging,  microbiology, ancillary and laboratory) are listed below for reference.    Significant Diagnostic Studies: Dg Chest 2 View  Result Date: 01/22/2018 CLINICAL DATA:  Loss of appetite, altered level of consciousness EXAM: CHEST - 2 VIEW COMPARISON:  11/29/2016 FINDINGS: The heart size and mediastinal contours are within normal limits. Both lungs are clear. The visualized skeletal structures are unremarkable. IMPRESSION: No active cardiopulmonary disease. Electronically Signed   By: Kathreen Devoid   On: 01/22/2018 13:33   Ct Head Wo Contrast  Result Date: 01/22/2018 CLINICAL DATA:  Altered mental status, dizziness and generalized weakness EXAM: CT HEAD WITHOUT CONTRAST TECHNIQUE: Contiguous axial images were obtained from the base of the skull through the vertex without intravenous contrast. COMPARISON:  Head CT 11/29/2016 FINDINGS: Brain: There is no mass, hemorrhage or extra-axial collection. There is generalized atrophy without lobar predilection. There is no acute or chronic infarction. There is hypoattenuation of the periventricular white matter, most commonly indicating chronic ischemic  microangiopathy. Vascular: No abnormal hyperdensity of the major intracranial arteries or dural venous sinuses. No intracranial atherosclerosis. Skull: The visualized skull base, calvarium and extracranial soft tissues are normal. Sinuses/Orbits: No fluid levels or advanced mucosal thickening of the visualized paranasal sinuses. No mastoid or middle ear effusion. The orbits are normal. IMPRESSION: Generalized atrophy and chronic small vessel disease without acute intracranial abnormality. Electronically Signed   By: Ulyses Jarred M.D.   On: 01/22/2018 14:15   Mr Jodene Nam Head Wo Contrast  Addendum Date: 01/24/2018   ADDENDUM REPORT: 01/24/2018 12:43 ADDENDUM: Study discussed by telephone with Dr. Shanon Brow Kita Neace on 01/24/2018 at 1235 hours. He advises that the patient initially presented on 01/22/2018, and therefore could be outside of the 24 hour window for consideration of revascularization. Electronically Signed   By: Genevie Ann M.D.   On: 01/24/2018 12:43   Result Date: 01/24/2018 CLINICAL DATA:  84 year old female with dizziness, generalized weakness and altered mental status. EXAM: MRI HEAD WITHOUT CONTRAST MRA HEAD WITHOUT CONTRAST TECHNIQUE: Multiplanar, multiecho pulse sequences of the brain and surrounding structures were obtained without intravenous contrast. Angiographic images of the head were obtained using MRA technique without contrast. COMPARISON:  Head CT without contrast 01/22/2018 and earlier. FINDINGS: Study is intermittently degraded by motion artifact despite repeated imaging attempts. MRI HEAD FINDINGS Brain: Scattered small cortical and white matter infarcts are present throughout the middle right MCA territory. Operculum region cortex mostly is affected. Scattered white matter involvement from an area anterior to the insula as far back as the periatrial white matter. The lateral peri rolandic cortex is affected. The basal ganglia are relatively spared. No contralateral or posterior fossa restricted  diffusion. No intracranial hemorrhage identified. There is Patchy and confluent periventricular white matter T2 and FLAIR hyperintensity in both hemispheres, with asymmetric involvement of the anterior right temporal lobe white matter. There is a small area of chronic cortical encephalomalacia in the medial left occipital lobe. The deep gray matter nuclei and brainstem are normal for age. There are small chronic infarcts in the right cerebellum. No midline shift, mass effect, evidence of mass lesion, ventriculomegaly, extra-axial collection. Cervicomedullary junction and pituitary are within normal limits. Vascular: Major intracranial vascular flow voids are preserved. Skull and upper cervical spine: Negative visible cervical spine. Normal bone marrow signal. Sinuses/Orbits: Postoperative changes to the right globe. Otherwise negative orbits soft tissues. Moderate maxillary sinus mucosal thickening. Other: Mastoid air cells are clear. Scalp and face soft tissues appear negative. MRA HEAD FINDINGS Antegrade flow in the posterior circulation with no  vertebral or basilar artery stenosis. Patent PICA origins. Patent SCA and left PCA origins. Fetal type right PCA origin, the left posterior communicating artery is diminutive or absent. Symmetric PCA branch enhancement. Antegrade flow in both ICA siphons. No siphon stenosis. Patent carotid termini. Patent MCA and ACA origins. Visible ACA branches are within normal limits. There is mild to moderate irregularity and stenosis of the left MCA M1 segment. Visible left MCA branches are within normal limits. The right MCA M1 is patent proximally but there is a segment of absent flow at the right MCA bifurcation. The posterior right MCA division appears patent while right middle/anterior M2 branches are absent. IMPRESSION: 1. Evidence of acute Large Vessel Occlusion at the Right MCA bifurcation. Decreased/absent flow in the anterior and middle right MCA divisions. 2. Associated  scattered acute small infarcts in the Right MCA territory, primarily the middle division. No associated hemorrhage or mass effect. 3. Mild or at most moderate left MCA M1 stenosis, no other intracranial large vessel abnormality. 4. Small chronic left PCA and right cerebellar territory infarcts. Moderately advanced chronic white matter disease. Electronically Signed: By: Genevie Ann M.D. On: 01/24/2018 12:31   Mr Brain Wo Contrast  Addendum Date: 01/24/2018   ADDENDUM REPORT: 01/24/2018 12:43 ADDENDUM: Study discussed by telephone with Dr. Shanon Brow Waleed Dettman on 01/24/2018 at 1235 hours. He advises that the patient initially presented on 01/22/2018, and therefore could be outside of the 24 hour window for consideration of revascularization. Electronically Signed   By: Genevie Ann M.D.   On: 01/24/2018 12:43   Result Date: 01/24/2018 CLINICAL DATA:  84 year old female with dizziness, generalized weakness and altered mental status. EXAM: MRI HEAD WITHOUT CONTRAST MRA HEAD WITHOUT CONTRAST TECHNIQUE: Multiplanar, multiecho pulse sequences of the brain and surrounding structures were obtained without intravenous contrast. Angiographic images of the head were obtained using MRA technique without contrast. COMPARISON:  Head CT without contrast 01/22/2018 and earlier. FINDINGS: Study is intermittently degraded by motion artifact despite repeated imaging attempts. MRI HEAD FINDINGS Brain: Scattered small cortical and white matter infarcts are present throughout the middle right MCA territory. Operculum region cortex mostly is affected. Scattered white matter involvement from an area anterior to the insula as far back as the periatrial white matter. The lateral peri rolandic cortex is affected. The basal ganglia are relatively spared. No contralateral or posterior fossa restricted diffusion. No intracranial hemorrhage identified. There is Patchy and confluent periventricular white matter T2 and FLAIR hyperintensity in both hemispheres, with  asymmetric involvement of the anterior right temporal lobe white matter. There is a small area of chronic cortical encephalomalacia in the medial left occipital lobe. The deep gray matter nuclei and brainstem are normal for age. There are small chronic infarcts in the right cerebellum. No midline shift, mass effect, evidence of mass lesion, ventriculomegaly, extra-axial collection. Cervicomedullary junction and pituitary are within normal limits. Vascular: Major intracranial vascular flow voids are preserved. Skull and upper cervical spine: Negative visible cervical spine. Normal bone marrow signal. Sinuses/Orbits: Postoperative changes to the right globe. Otherwise negative orbits soft tissues. Moderate maxillary sinus mucosal thickening. Other: Mastoid air cells are clear. Scalp and face soft tissues appear negative. MRA HEAD FINDINGS Antegrade flow in the posterior circulation with no vertebral or basilar artery stenosis. Patent PICA origins. Patent SCA and left PCA origins. Fetal type right PCA origin, the left posterior communicating artery is diminutive or absent. Symmetric PCA branch enhancement. Antegrade flow in both ICA siphons. No siphon stenosis. Patent carotid termini. Patent MCA  and ACA origins. Visible ACA branches are within normal limits. There is mild to moderate irregularity and stenosis of the left MCA M1 segment. Visible left MCA branches are within normal limits. The right MCA M1 is patent proximally but there is a segment of absent flow at the right MCA bifurcation. The posterior right MCA division appears patent while right middle/anterior M2 branches are absent. IMPRESSION: 1. Evidence of acute Large Vessel Occlusion at the Right MCA bifurcation. Decreased/absent flow in the anterior and middle right MCA divisions. 2. Associated scattered acute small infarcts in the Right MCA territory, primarily the middle division. No associated hemorrhage or mass effect. 3. Mild or at most moderate left  MCA M1 stenosis, no other intracranial large vessel abnormality. 4. Small chronic left PCA and right cerebellar territory infarcts. Moderately advanced chronic white matter disease. Electronically Signed: By: Genevie Ann M.D. On: 01/24/2018 12:31   US Carotid Bilateral  Result Date: 01/24/2018 CLINICAL DATA:  84 year old female with dizziness, weakness and altered mental status as well as evidence of acute right EXAM: BILATERAL CAROTID DUPLEX ULTRASOUND TECHNIQUE: Pearline Cables scale imaging, color Doppler and duplex ultrasound were performed of bilateral carotid and vertebral arteries in the neck. COMPARISON:  None. FINDINGS: Criteria: Quantification of carotid stenosis is based on velocity parameters that correlate the residual internal carotid diameter with NASCET-based stenosis levels, using the diameter of the distal internal carotid lumen as the denominator for stenosis measurement. The following velocity measurements were obtained: RIGHT ICA: 113/22 cm/sec CCA: 132/44 cm/sec SYSTOLIC ICA/CCA RATIO:  1.1 ECA:  85 cm/sec LEFT ICA: 149/26 cm/sec CCA: 01/02 cm/sec SYSTOLIC ICA/CCA RATIO:  1.6 ECA:  96 cm/sec RIGHT CAROTID ARTERY: Mild heterogeneous atherosclerotic plaque in the proximal internal carotid artery. By peak systolic velocity criteria, the stenosis remains less than 50%. RIGHT VERTEBRAL ARTERY:  Patent with normal antegrade flow. LEFT CAROTID ARTERY: Mild focal heterogeneous atherosclerotic plaque in the proximal internal carotid artery. By peak systolic velocity criteria, the estimated stenosis remains less than 50%. There is spurious elevation of the peak systolic velocity in the more distal internal carotid artery in a region of tortuosity. LEFT VERTEBRAL ARTERY:  Patent with normal antegrade flow. Other: Pulsus bisferious waveform. IMPRESSION: 1. Mild (1-49%) stenosis proximal right internal carotid artery secondary to heterogenous atherosclerotic plaque. 2. Mild (1-49%) stenosis proximal left internal  carotid artery secondary to heterogenous atherosclerotic plaque. 3. Pulsus bisferious waveform suggests underlying aortic valvular disease such as aortic regurgitation or aortic stenosis with regurgitation. 4. Vertebral arteries are patent with antegrade flow. Signed, Criselda Peaches, MD [facility] Radiology Electronically Signed   By: Jacqulynn Cadet M.D.   On: 01/24/2018 14:10   Dg Chest Port 1 View  Result Date: 01/24/2018 CLINICAL DATA:  Respiratory distress EXAM: PORTABLE CHEST 1 VIEW COMPARISON:  01/22/2018, 11/29/2016 FINDINGS: No acute airspace disease or pleural effusion. Mild cardiomegaly. No pneumothorax. IMPRESSION: No acute airspace disease. Mild cardiomegaly, increased compared to prior. Electronically Signed   By: Donavan Foil M.D.   On: 01/24/2018 22:47   Dg Swallowing Func-speech Pathology  Result Date: 01/24/2018 Objective Swallowing Evaluation: Type of Study: MBS-Modified Barium Swallow Study  Patient Details Name: DONISHA HOCH MRN: [id number] Date of Birth: 07-07-1925 Today's Date: 01/24/2018 Time: SLP Start Time (ACUTE ONLY): 1415 -SLP Stop Time (ACUTE ONLY): 1500 SLP Time Calculation (min) (ACUTE ONLY): 45 min Past Medical History: Past Medical History: Diagnosis Date . Anemia  . Diverticulosis  . GI bleed   diverticular/hemorrhoidal . Hemorrhoids  .  Hypertension  Past Surgical History: Past Surgical History: Procedure Laterality Date . CATARACT EXTRACTION W/PHACO  05/29/2012  Procedure: CATARACT EXTRACTION PHACO AND INTRAOCULAR LENS PLACEMENT (IOC);  Surgeon: Tonny Branch, MD;  Location: [facility];  Service: Ophthalmology;  Laterality: Right;  CDE:18.72 . CHOLECYSTECTOMY   . COLONOSCOPY N/A 06/24/2013  Significant diverticular disease. Large hemorrhoids. Terminal ileum normal. 2 rectosigmoid polyps (TA) removed. No old or fresh blood noted during exam. Likely had bleeding from diverticula and hemorrhoids. consider flex sig in 3 years. Marland Kitchen LEFT  AND RIGHT HEART CATHETERIZATION WITH CORONARY ANGIOGRAM N/A 11/23/2013  Procedure: LEFT AND RIGHT HEART CATHETERIZATION WITH CORONARY ANGIOGRAM;  Surgeon: Birdie Riddle, MD;  Location: [facility];  Service: Cardiovascular;  Laterality: N/A; HPI: 84 y.o.femalewith medical history ofcognitive impairment, hypertension, diverticulosis, systolic CHF, coronary artery disease presenting with4-day history of generalized weakness. At baseline, the patient is independent with all her ADLs although the patient's niece states that she does have some memory problems. In the past month, her niece has noted that the patient has had increasing gait instability. However, because of her worsening generalized weakness, the patient was brought to emergency department on 01/22/2018. Patient improved clinically after IV fluids in the emergency department. She was sent home with cephalexin for treatment of UTI. However, the patient continued to remain somnolent with worsening generalized weakness to the point she was not able to get out of bed on the morning of 01/23/2018. As result, the patient was brought back to the emergency department for further evaluation. Because of her somnolence and generalized weakness, the patient has had decreased oral intake. In the emergency department, the patient was afebrile hemodynamically stable saturating 99% on room air. WBC was 12.2 with hemoglobin 11.7. BMP showed serum creatinine 1.00. Otherwise electro lites were unremarkable. Troponin was 0.06. Urinalysis was negative for pyuria. Chest x-ray was negative for any acute findings. CT of the brain was negative for acute findings. MRI shows: Evidence of acute Large Vessel Occlusion at the Right MCA bifurcation. Decreased/absent flow in the anterior and middle right MCA divisions. Associated scattered acute small infarcts in the Right MCA territory, primarily the middle division. No associated hemorrhage or mass effect. Mild or at most  moderate left MCA M1 stenosis, no other intracranial large vessel abnormality. Small chronic left PCA and right cerebellar territory infarcts. Moderately advanced chronic white matter disease. BSE requested.  Subjective: "She's been holding phlegm in her mouth for a while now." Assessment / Plan / Recommendation [facility] IP CLINICAL IMPRESSIONS 01/24/2018 Clinical Impression Pt presents with mild/mod oropharyngeal phase dysphagia and mod/severe pharyngoesophageal dysphagia due to large outpouching (suspect Zenker's but no radiologist present to confirm). Oral phase is characterized by oral transit delays, reduced bolus cohesiveness and premature spillage into pharynx; pharyngeal phase is marked by delay in swallow initiation with swallow trigger after spilling to the pyriforms with liquids and reduced cricopharyngeal relaxation with evidence of large outpouching suspicious for Zenker's diverticulum which collected thin, puree, and regular textures. Please see imaging tab for images of Zenker's. Pt protected airway reasonably well, but did have one episode of trace, silent aspiration of thin via straw sips. The presence of the large outpouching places Pt at significant risk of backflow into pharynx and eventual aspiration. Backflow from outpouching observed across consistencies and textures. Unfortunately, after discussion with MD, Pt is a poor candidate for Zenker's repair at this time given acute infarcts. Mrs. Rottman is alert and responsive to questions today and tells her niece that she would not  want a feeding tube and would like to eat by mouth. Recommend D3/mech soft with thin liquids via cup sips, avoid straws and encourage Pt to go slowly, take small bites, swallow several times for each swallow, and remain upright after meals for at least 30 minutes. Family understands that risk for aspiration is high at this time, in addition to dehydration/malnutrition. Recommend RD consult to assist with food choices. Suspect  that Pt will tolerate liquids more efficiently than solids and would recommend crushing pills as able in puree. SLP will follow during acute stay. Above to RN and MD.   SLP Visit Diagnosis Dysphagia, pharyngoesophageal phase (R13.14);Dysphagia, oropharyngeal phase (R13.12) Attention and concentration deficit following -- Frontal lobe and executive function deficit following -- Impact on safety and function --   [facility] IP TREATMENT RECOMMENDATION 01/24/2018 Treatment Recommendations Therapy as outlined in treatment plan below   Prognosis 01/24/2018 Prognosis for Safe Diet Advancement Guarded Barriers to Reach Goals Severity of deficits Barriers/Prognosis Comment -- [facility] IP DIET RECOMMENDATION 01/24/2018 SLP Diet Recommendations Dysphagia 3 (Mech soft) solids;Thin liquid Liquid Administration via Cup Medication Administration Crushed with puree Compensations Slow rate;Small sips/bites;Multiple dry swallows after each bite/sip Postural Changes Remain semi-upright after after feeds/meals (Comment);Seated upright at 90 degrees   [facility] IP OTHER RECOMMENDATIONS 01/24/2018 Recommended Consults Consider esophageal assessment;Consider ENT evaluation Oral Care Recommendations Oral care BID;Staff/trained caregiver to provide oral care Other Recommendations Clarify dietary restrictions   [facility] IP FOLLOW UP RECOMMENDATIONS 01/24/2018 Follow up Recommendations Skilled Nursing facility   [facility] IP FREQUENCY AND DURATION 01/24/2018 Speech Therapy Frequency (ACUTE ONLY) min 2x/week Treatment Duration 1 week      [facility] IP ORAL PHASE 01/24/2018 Oral Phase Impaired Oral - Pudding Teaspoon -- Oral - Pudding Cup -- Oral - Honey Teaspoon -- Oral - Honey Cup -- Oral - Nectar Teaspoon -- Oral - Nectar Cup -- Oral - Nectar Straw -- Oral - Thin Teaspoon Premature spillage;Decreased bolus cohesion Oral - Thin Cup Premature spillage;Decreased bolus cohesion Oral - Thin Straw Premature spillage Oral - Puree Lingual/palatal residue;Holding of bolus;Piecemeal  swallowing;Delayed oral transit;Decreased bolus cohesion Oral - Mech Soft -- Oral - Regular Delayed oral transit Oral - Multi-Consistency -- Oral - Pill NT Oral Phase - Comment --  [facility] IP PHARYNGEAL PHASE 01/24/2018 Pharyngeal Phase Impaired Pharyngeal- Pudding Teaspoon -- Pharyngeal -- Pharyngeal- Pudding Cup -- Pharyngeal -- Pharyngeal- Honey Teaspoon -- Pharyngeal -- Pharyngeal- Honey Cup -- Pharyngeal -- Pharyngeal- Nectar Teaspoon -- Pharyngeal -- Pharyngeal- Nectar Cup -- Pharyngeal -- Pharyngeal- Nectar Straw -- Pharyngeal -- Pharyngeal- Thin Teaspoon Delayed swallow initiation-pyriform sinuses;Pharyngeal residue - pyriform;Pharyngeal residue - cp segment Pharyngeal -- Pharyngeal- Thin Cup Delayed swallow initiation-pyriform sinuses;Penetration/Aspiration during swallow;Pharyngeal residue - pyriform;Pharyngeal residue - cp segment Pharyngeal Material does not enter airway;Material enters airway, remains ABOVE vocal cords then ejected out Pharyngeal- Thin Straw Delayed swallow initiation-pyriform sinuses;Reduced airway/laryngeal closure;Penetration/Apiration after swallow;Trace aspiration;Pharyngeal residue - valleculae;Pharyngeal residue - pyriform;Pharyngeal residue - cp segment Pharyngeal Material does not enter airway;Material enters airway, remains ABOVE vocal cords then ejected out;Material enters airway, passes BELOW cords then ejected out Pharyngeal- Puree Delayed swallow initiation-vallecula;Pharyngeal residue - valleculae;Pharyngeal residue - posterior pharnyx;Pharyngeal residue - cp segment;Pharyngeal residue - pyriform Pharyngeal -- Pharyngeal- Mechanical Soft -- Pharyngeal -- Pharyngeal- Regular Pharyngeal residue - pyriform Pharyngeal -- Pharyngeal- Multi-consistency -- Pharyngeal -- Pharyngeal- Pill -- Pharyngeal -- Pharyngeal Comment Pharyngeal phase negatively impacted by suspected Zenker's diverticulum and backflow into pharynx  CHL IP CERVICAL ESOPHAGEAL PHASE 01/24/2018 Cervical Esophageal  Phase Impaired Pudding Teaspoon --  Pudding Cup -- Honey Teaspoon -- Honey Cup -- Nectar Teaspoon -- Nectar Cup -- Nectar Straw -- Thin Teaspoon Reduced cricopharyngeal relaxation;Prominent cricopharyngeal segment;Esophageal backflow into the pharynx Thin Cup -- Thin Straw -- Puree -- Mechanical Soft -- Regular Reduced cricopharyngeal relaxation;Prominent cricopharyngeal segment;Esophageal backflow into the pharynx Multi-consistency -- Pill -- Cervical Esophageal Comment Please see imaging for substantial outpouching near UES (suspect Zenker's diverticulum) Thank you, Genene Churn, [place] No flowsheet data found. PORTER,DABNEY 01/24/2018, 4:29 PM                Microbiology: Recent Results (from the past 240 hour(s))  Urine culture     Status: Abnormal   Collection Time: 01/22/18 12:54 PM  Result Value Ref Range Status   Specimen Description   Final    URINE, RANDOM Performed at [facility], [address] [place], [place] [address]    Special Requests   Final    NONE Performed at [facility], [address] [place], [place] [address]    Culture MULTIPLE SPECIES PRESENT, SUGGEST RECOLLECTION (A)  Final   Report Status 01/25/2018 FINAL  Final  MRSA PCR Screening     Status: None   Collection Time: 01/24/18 10:56 PM  Result Value Ref Range Status   MRSA by PCR NEGATIVE NEGATIVE Final    Comment:        The GeneXpert MRSA Assay (FDA approved for NASAL specimens only), is one component of a comprehensive MRSA colonization surveillance program. It is not intended to diagnose MRSA infection nor to guide or monitor treatment for MRSA infections. Performed at [facility], [address] [place], [place] [address]      Labs: Basic Metabolic Panel: Recent Labs  Lab 01/23/18 [phone] 01/24/18 0628 01/24/18 2244 01/24/18 2245 01/25/18 0451 01/25/18 1041 01/26/18 0600  NA 136 140 135  --   --  137 139  K 4.4 3.1* 3.0*  --   --  3.7 4.2  CL 102 106 104  --   --  100 104   CO2 25 22 22   --   --  25 27  GLUCOSE 85 59* 199*  --   --  133* 111*  BUN 15 13 11   --   --  15 19  CREATININE 1.00 0.82 0.96  --   --  1.02* 1.04*  CALCIUM 8.9 8.4* 8.2*  --   --  8.6* 8.8*  MG  --   --  1.8  --  2.1  --   --   PHOS  --   --   --  2.9  --   --   --    Liver Function Tests: Recent Labs  Lab 01/22/18 1419  AST 40  ALT 20  ALKPHOS 65  BILITOT 1.3*  PROT 8.2*  ALBUMIN 3.8   No results for input(s): LIPASE, AMYLASE in the last 168 hours. Recent Labs  Lab 01/23/18 1345  AMMONIA 10   CBC: Recent Labs  Lab 01/22/18 1419 01/23/18 0934 01/24/18 0628 01/24/18 2244 01/26/18 0600  WBC 14.9* 12.2* 9.0 10.1 7.8  NEUTROABS 12.4* 9.9*  --  5.5  --   HGB 12.8 11.7* 10.5* 10.3* 10.4*  HCT 38.5 35.0* 33.3* 33.0* 32.8*  MCV 88.5 88.8 90.0 91.2 89.9  PLT 337 287 332 325 352   Cardiac Enzymes: Recent Labs  Lab 01/23/18 1905 01/24/18 2244 01/25/18 0451 01/25/18 1041 01/25/18 1600  TROPONINI 0.07* 0.13* 0.13* 0.09* 0.11*   BNP: Invalid input(s): POCBNP  CBG: Recent Labs  Lab 01/22/18 1305 01/24/18 2238  GLUCAP 110* 206*    Time coordinating discharge:  36 minutes  Signed:  Orson Eva, DO [facility] Pager: [phone] 01/27/2018, 7:40 AM

## 2018-01-26 NOTE — Plan of Care (Signed)
  Problem: Acute Rehab PT Goals(only PT should resolve) Goal: Pt Will Go Supine/Side To Sit Outcome: Progressing Flowsheets (Taken 01/26/2018 0918) Pt will go Supine/Side to Sit: with modified independence Goal: Patient Will Perform Sitting Balance Outcome: Progressing Flowsheets (Taken 01/26/2018 0918) Patient will perform sitting balance: with modified independence Goal: Patient Will Transfer Sit To/From Stand Outcome: Progressing Flowsheets (Taken 01/26/2018 0918) Patient will transfer sit to/from stand: with supervision Goal: Pt Will Transfer Bed To Chair/Chair To Bed Outcome: Progressing Flowsheets (Taken 01/26/2018 0918) Pt will Transfer Bed to Chair/Chair to Bed: with supervision Goal: Pt Will Ambulate Outcome: Progressing Flowsheets (Taken 01/26/2018 0918) Pt will Ambulate: 25 feet;with supervision;with least restrictive assistive device Goal: Pt Will Go Up/Down Stairs Outcome: Progressing Flowsheets (Taken 01/26/2018 0918) Pt will Go Up / Down Stairs: 6-9 stairs;with supervision  Clarene Critchley PT, DPT 9:20 AM, 01/26/18 [phone]

## 2018-01-26 NOTE — Progress Notes (Signed)
PROGRESS NOTE  Lynn Weaver TSV:[id number] DOB: 20-May-1925 DOA: 01/23/2018 PCP: Redmond School, MD   Brief History: 84 y.o.femalewith medical history ofcognitive impairment, hypertension, diverticulosis, systolic CHF, coronary artery disease presenting with4-day history of generalized weakness. At baseline, the patient is independent with all her ADLs although the patient's niece states that she does have some memory problems. In the past month, her niece has noted that the patient has had increasing gait instability. However, because of her worsening generalized weakness, the patient was brought to emergency department on 01/22/2018. Patient improved clinically after IV fluids in the emergency department. She was sent home with cephalexin for treatment of UTI. However, the patient continued to remain somnolent with worsening generalized weakness to the point she was not able to get out of bed on the morning of 01/23/2018. As result, the patient was brought back to the emergency department for further evaluation. Because of her somnolence and generalized weakness, the patient has had decreased oral intake.  In the emergency department, the patient was afebrile hemodynamically stable saturating 99% on room air. WBC was 12.2 with hemoglobin 11.7. BMP showed serum creatinine 1.00. Otherwise electro lites were unremarkable. Troponin was 0.06. Urinalysis was negative for pyuria. Chest x-ray was negative for any acute findings. CT of the brain was negative for acute findings.    Assessment/Plan: Acute metabolic encephalopathy -multifactorial including stroke, aspiration pneumonitis and dehydration -near baseline, pleasantly confused -Serum B12--220 -Check PQZRAQT--62 -Folic UQJF--3.5 -KTG--2.563  Aspiration pneumonitis -The patient was noted to have a retained pill in her posterior oropharynxin ED -now with retained secretions in post oropharynx -Speech therapy  evaluation-->dysphagia 3 diet with thin diet -ContinueUnasyn  Zenker's Diverticulum -not a candidate for surgery presently -speech to help with mitigating strategies  Dehydration -The patient was hemoconcentrated -Continue IV fluids  Right MCA ischemic stroke -PT evaluation-->SNF -Speech therapy eval--> revealed a large Zenker's diverticulum; dysphagia 3 diet -CT brain--neg for acute findings -MRI Brain--scattered acute infarcts in the right MCA territory -MRA brain--large vessel occlusion in the right MCA -After discussion with radiology and neurology, patient was not a candidate for invasive intervention as she was outside the window of time -Carotid Duplex--negative for hemodynamically significant stenosis -Echo--EF 55%, grade 1 DD, moderate MR/TR, PASP 83, no PFO -LDL--94 -HbA1C--5.6 -Antiplatelet--ASA supp  Paroxysmal atrial fibrillation with RVR -01/24/2018 evening--given metoprolol IV and started on diltiazem -pt continues to go in and out of Afib -Minimizing antihypertensive agents temporarily to allow for permissive hypertension -Case discussed with neurology, Dr. Bing Plume start anticoagulation 72 hours after onset of stroke symptoms -started apixaban 7/6  Essential Hypertension -allowing for permissive HTN -holding metoprolol and lisinopril  Elevated troponin -due to demand ischemia -repeat echo--EF 55%, grade 1 DD, moderate MR/TR, PASP 83, no PFO -cycle troponins--flat trend  Chronic systolic and diastolic CHF -Daily weights -11/18/2013 echo EF 20-25%, grade 1 DD -01/23/18-Echo--EF 55%, grade 1 DD, moderate MR/TR, PASP 83, no PFO -holding lisinopril temporarily allowing for permissive HTN  Hypokalemia -repleted -check mag 2.1  Goal of Care -DNR -palliative medicine consult -pt now with poor po intake and hypoglycemia   Disposition Plan:SNF 7/8 if stable Family Communication:Niece updatedon phone 7/7  Consultants:Palliative  medicine  Code Status: DNR  DVT Prophylaxis: apixaban   Procedures: As Listed in Progress Note Above  Antibiotics: unasyn 7/4>>>         Subjective: Patient denies fevers, chills, headache, chest pain, dyspnea, nausea, vomiting, diarrhea, abdominal pain, dysuria, hematuria, hematochezia,  and melena.   Objective: Vitals:   01/26/18 0300 01/26/18 0400 01/26/18 0737 01/26/18 1116  BP: (!) 104/50 (!) 137/57    Pulse: (!) 56 (!) 49 (!) 54 72  Resp: 12 (!) 9 (!) 9 12  Temp:  (!) 97.4 F (36.3 C) 97.6 F (36.4 C) 97.6 F (36.4 C)  TempSrc:   Axillary Oral  SpO2: 100% 100% 99% 97%  Weight:  46.9 kg (103 lb 6.3 oz)    Height:        Intake/Output Summary (Last 24 hours) at 01/26/2018 1340 Last data filed at 01/26/2018 1300 Gross per 24 hour  Intake 823 ml  Output 825 ml  Net -2 ml   Weight change: 0.3 kg (10.6 oz) Exam:   General:  Pt is alert, follows commands appropriately, not in acute distress  HEENT: No icterus, No thrush, No neck mass, [place]/AT  Cardiovascular: RRR, S1/S2, no rubs, no gallops  Respiratory: bibasilar rales, no wheeze  Abdomen: Soft/+BS, non tender, non distended, no guarding  Extremities: No edema, No lymphangitis, No petechiae, No rashes, no synovitis   Data Reviewed: I have personally reviewed following labs and imaging studies Basic Metabolic Panel: Recent Labs  Lab 01/23/18 0934 01/24/18 0628 01/24/18 2244 01/24/18 2245 01/25/18 0451 01/25/18 1041 01/26/18 0600  NA 136 140 135  --   --  137 139  K 4.4 3.1* 3.0*  --   --  3.7 4.2  CL 102 106 104  --   --  100 104  CO2 25 22 22   --   --  25 27  GLUCOSE 85 59* 199*  --   --  133* 111*  BUN 15 13 11   --   --  15 19  CREATININE 1.00 0.82 0.96  --   --  1.02* 1.04*  CALCIUM 8.9 8.4* 8.2*  --   --  8.6* 8.8*  MG  --   --  1.8  --  2.1  --   --   PHOS  --   --   --  2.9  --   --   --    Liver Function Tests: Recent Labs  Lab 01/22/18 1419  AST 40  ALT 20  ALKPHOS  65  BILITOT 1.3*  PROT 8.2*  ALBUMIN 3.8   No results for input(s): LIPASE, AMYLASE in the last 168 hours. Recent Labs  Lab 01/23/18 1345  AMMONIA 10   Coagulation Profile: No results for input(s): INR, PROTIME in the last 168 hours. CBC: Recent Labs  Lab 01/22/18 1419 01/23/18 0934 01/24/18 0628 01/24/18 2244 01/26/18 0600  WBC 14.9* 12.2* 9.0 10.1 7.8  NEUTROABS 12.4* 9.9*  --  5.5  --   HGB 12.8 11.7* 10.5* 10.3* 10.4*  HCT 38.5 35.0* 33.3* 33.0* 32.8*  MCV 88.5 88.8 90.0 91.2 89.9  PLT 337 287 332 325 352   Cardiac Enzymes: Recent Labs  Lab 01/23/18 1905 01/24/18 2244 01/25/18 0451 01/25/18 1041 01/25/18 1600  TROPONINI 0.07* 0.13* 0.13* 0.09* 0.11*   BNP: Invalid input(s): POCBNP CBG: Recent Labs  Lab 01/22/18 1305 01/24/18 2238  GLUCAP 110* 206*   HbA1C: Recent Labs    01/23/18 1345  HGBA1C 5.6   Urine analysis:    Component Value Date/Time   COLORURINE AMBER (A) 01/23/2018 1001   APPEARANCEUR CLEAR 01/23/2018 1001   LABSPEC 1.016 01/23/2018 1001   PHURINE 5.0 01/23/2018 1001   GLUCOSEU NEGATIVE 01/23/2018 1001   HGBUR SMALL (A) 01/23/2018 1001  BILIRUBINUR NEGATIVE 01/23/2018 1001   KETONESUR 5 (A) 01/23/2018 1001   PROTEINUR NEGATIVE 01/23/2018 1001   UROBILINOGEN 0.2 11/17/2013 1123   NITRITE NEGATIVE 01/23/2018 1001   LEUKOCYTESUR NEGATIVE 01/23/2018 1001   Sepsis Labs: @LABRCNTIP (procalcitonin:4,lacticidven:4) ) Recent Results (from the past 240 hour(s))  Urine culture     Status: Abnormal   Collection Time: 01/22/18 12:54 PM  Result Value Ref Range Status   Specimen Description   Final    URINE, RANDOM Performed at [facility], [address] [place], [place] [address]    Special Requests   Final    NONE Performed at [facility], [address] [place], [place] [address]    Culture MULTIPLE SPECIES PRESENT, SUGGEST RECOLLECTION (A)  Final   Report Status 01/25/2018 FINAL  Final  MRSA PCR Screening     Status: None    Collection Time: 01/24/18 10:56 PM  Result Value Ref Range Status   MRSA by PCR NEGATIVE NEGATIVE Final    Comment:        The GeneXpert MRSA Assay (FDA approved for NASAL specimens only), is one component of a comprehensive MRSA colonization surveillance program. It is not intended to diagnose MRSA infection nor to guide or monitor treatment for MRSA infections. Performed at [facility], [address] [place], [place] [address]      Scheduled Meds: . apixaban  2.5 mg Oral BID  . aspirin EC  81 mg Oral Daily  . mouth rinse  15 mL Mouth Rinse BID  . sodium chloride flush  3 mL Intravenous Q12H  . [START ON 01/27/2018] vitamin B-12  500 mcg Oral Daily   Continuous Infusions: . sodium chloride 10 mL/hr at 01/26/18 0950  . ampicillin-sulbactam (UNASYN) IV Stopped (01/26/18 1038)  . diltiazem (CARDIZEM) infusion Stopped (01/25/18 0326)    Procedures/Studies: Dg Chest 2 View  Result Date: 01/22/2018 CLINICAL DATA:  Loss of appetite, altered level of consciousness EXAM: CHEST - 2 VIEW COMPARISON:  11/29/2016 FINDINGS: The heart size and mediastinal contours are within normal limits. Both lungs are clear. The visualized skeletal structures are unremarkable. IMPRESSION: No active cardiopulmonary disease. Electronically Signed   By: Kathreen Devoid   On: 01/22/2018 13:33   Ct Head Wo Contrast  Result Date: 01/22/2018 CLINICAL DATA:  Altered mental status, dizziness and generalized weakness EXAM: CT HEAD WITHOUT CONTRAST TECHNIQUE: Contiguous axial images were obtained from the base of the skull through the vertex without intravenous contrast. COMPARISON:  Head CT 11/29/2016 FINDINGS: Brain: There is no mass, hemorrhage or extra-axial collection. There is generalized atrophy without lobar predilection. There is no acute or chronic infarction. There is hypoattenuation of the periventricular white matter, most commonly indicating chronic ischemic microangiopathy. Vascular: No abnormal hyperdensity  of the major intracranial arteries or dural venous sinuses. No intracranial atherosclerosis. Skull: The visualized skull base, calvarium and extracranial soft tissues are normal. Sinuses/Orbits: No fluid levels or advanced mucosal thickening of the visualized paranasal sinuses. No mastoid or middle ear effusion. The orbits are normal. IMPRESSION: Generalized atrophy and chronic small vessel disease without acute intracranial abnormality. Electronically Signed   By: Ulyses Jarred M.D.   On: 01/22/2018 14:15   Mr Jodene Nam Head Wo Contrast  Addendum Date: 01/24/2018   ADDENDUM REPORT: 01/24/2018 12:43 ADDENDUM: Study discussed by telephone with Dr. Shanon Brow Kimble Hitchens on 01/24/2018 at 1235 hours. He advises that the patient initially presented on 01/22/2018, and therefore could be outside of the 24 hour window for consideration of revascularization. Electronically Signed  By: Genevie Ann M.D.   On: 01/24/2018 12:43   Result Date: 01/24/2018 CLINICAL DATA:  84 year old female with dizziness, generalized weakness and altered mental status. EXAM: MRI HEAD WITHOUT CONTRAST MRA HEAD WITHOUT CONTRAST TECHNIQUE: Multiplanar, multiecho pulse sequences of the brain and surrounding structures were obtained without intravenous contrast. Angiographic images of the head were obtained using MRA technique without contrast. COMPARISON:  Head CT without contrast 01/22/2018 and earlier. FINDINGS: Study is intermittently degraded by motion artifact despite repeated imaging attempts. MRI HEAD FINDINGS Brain: Scattered small cortical and white matter infarcts are present throughout the middle right MCA territory. Operculum region cortex mostly is affected. Scattered white matter involvement from an area anterior to the insula as far back as the periatrial white matter. The lateral peri rolandic cortex is affected. The basal ganglia are relatively spared. No contralateral or posterior fossa restricted diffusion. No intracranial hemorrhage identified. There  is Patchy and confluent periventricular white matter T2 and FLAIR hyperintensity in both hemispheres, with asymmetric involvement of the anterior right temporal lobe white matter. There is a small area of chronic cortical encephalomalacia in the medial left occipital lobe. The deep gray matter nuclei and brainstem are normal for age. There are small chronic infarcts in the right cerebellum. No midline shift, mass effect, evidence of mass lesion, ventriculomegaly, extra-axial collection. Cervicomedullary junction and pituitary are within normal limits. Vascular: Major intracranial vascular flow voids are preserved. Skull and upper cervical spine: Negative visible cervical spine. Normal bone marrow signal. Sinuses/Orbits: Postoperative changes to the right globe. Otherwise negative orbits soft tissues. Moderate maxillary sinus mucosal thickening. Other: Mastoid air cells are clear. Scalp and face soft tissues appear negative. MRA HEAD FINDINGS Antegrade flow in the posterior circulation with no vertebral or basilar artery stenosis. Patent PICA origins. Patent SCA and left PCA origins. Fetal type right PCA origin, the left posterior communicating artery is diminutive or absent. Symmetric PCA branch enhancement. Antegrade flow in both ICA siphons. No siphon stenosis. Patent carotid termini. Patent MCA and ACA origins. Visible ACA branches are within normal limits. There is mild to moderate irregularity and stenosis of the left MCA M1 segment. Visible left MCA branches are within normal limits. The right MCA M1 is patent proximally but there is a segment of absent flow at the right MCA bifurcation. The posterior right MCA division appears patent while right middle/anterior M2 branches are absent. IMPRESSION: 1. Evidence of acute Large Vessel Occlusion at the Right MCA bifurcation. Decreased/absent flow in the anterior and middle right MCA divisions. 2. Associated scattered acute small infarcts in the Right MCA territory,  primarily the middle division. No associated hemorrhage or mass effect. 3. Mild or at most moderate left MCA M1 stenosis, no other intracranial large vessel abnormality. 4. Small chronic left PCA and right cerebellar territory infarcts. Moderately advanced chronic white matter disease. Electronically Signed: By: Genevie Ann M.D. On: 01/24/2018 12:31   Mr Brain Wo Contrast  Addendum Date: 01/24/2018   ADDENDUM REPORT: 01/24/2018 12:43 ADDENDUM: Study discussed by telephone with Dr. Shanon Brow Lawarence Meek on 01/24/2018 at 1235 hours. He advises that the patient initially presented on 01/22/2018, and therefore could be outside of the 24 hour window for consideration of revascularization. Electronically Signed   By: Genevie Ann M.D.   On: 01/24/2018 12:43   Result Date: 01/24/2018 CLINICAL DATA:  84 year old female with dizziness, generalized weakness and altered mental status. EXAM: MRI HEAD WITHOUT CONTRAST MRA HEAD WITHOUT CONTRAST TECHNIQUE: Multiplanar, multiecho pulse sequences of the brain and  surrounding structures were obtained without intravenous contrast. Angiographic images of the head were obtained using MRA technique without contrast. COMPARISON:  Head CT without contrast 01/22/2018 and earlier. FINDINGS: Study is intermittently degraded by motion artifact despite repeated imaging attempts. MRI HEAD FINDINGS Brain: Scattered small cortical and white matter infarcts are present throughout the middle right MCA territory. Operculum region cortex mostly is affected. Scattered white matter involvement from an area anterior to the insula as far back as the periatrial white matter. The lateral peri rolandic cortex is affected. The basal ganglia are relatively spared. No contralateral or posterior fossa restricted diffusion. No intracranial hemorrhage identified. There is Patchy and confluent periventricular white matter T2 and FLAIR hyperintensity in both hemispheres, with asymmetric involvement of the anterior right temporal lobe  white matter. There is a small area of chronic cortical encephalomalacia in the medial left occipital lobe. The deep gray matter nuclei and brainstem are normal for age. There are small chronic infarcts in the right cerebellum. No midline shift, mass effect, evidence of mass lesion, ventriculomegaly, extra-axial collection. Cervicomedullary junction and pituitary are within normal limits. Vascular: Major intracranial vascular flow voids are preserved. Skull and upper cervical spine: Negative visible cervical spine. Normal bone marrow signal. Sinuses/Orbits: Postoperative changes to the right globe. Otherwise negative orbits soft tissues. Moderate maxillary sinus mucosal thickening. Other: Mastoid air cells are clear. Scalp and face soft tissues appear negative. MRA HEAD FINDINGS Antegrade flow in the posterior circulation with no vertebral or basilar artery stenosis. Patent PICA origins. Patent SCA and left PCA origins. Fetal type right PCA origin, the left posterior communicating artery is diminutive or absent. Symmetric PCA branch enhancement. Antegrade flow in both ICA siphons. No siphon stenosis. Patent carotid termini. Patent MCA and ACA origins. Visible ACA branches are within normal limits. There is mild to moderate irregularity and stenosis of the left MCA M1 segment. Visible left MCA branches are within normal limits. The right MCA M1 is patent proximally but there is a segment of absent flow at the right MCA bifurcation. The posterior right MCA division appears patent while right middle/anterior M2 branches are absent. IMPRESSION: 1. Evidence of acute Large Vessel Occlusion at the Right MCA bifurcation. Decreased/absent flow in the anterior and middle right MCA divisions. 2. Associated scattered acute small infarcts in the Right MCA territory, primarily the middle division. No associated hemorrhage or mass effect. 3. Mild or at most moderate left MCA M1 stenosis, no other intracranial large vessel  abnormality. 4. Small chronic left PCA and right cerebellar territory infarcts. Moderately advanced chronic white matter disease. Electronically Signed: By: Genevie Ann M.D. On: 01/24/2018 12:31   US Carotid Bilateral  Result Date: 01/24/2018 CLINICAL DATA:  84 year old female with dizziness, weakness and altered mental status as well as evidence of acute right EXAM: BILATERAL CAROTID DUPLEX ULTRASOUND TECHNIQUE: Pearline Cables scale imaging, color Doppler and duplex ultrasound were performed of bilateral carotid and vertebral arteries in the neck. COMPARISON:  None. FINDINGS: Criteria: Quantification of carotid stenosis is based on velocity parameters that correlate the residual internal carotid diameter with NASCET-based stenosis levels, using the diameter of the distal internal carotid lumen as the denominator for stenosis measurement. The following velocity measurements were obtained: RIGHT ICA: 113/22 cm/sec CCA: 185/63 cm/sec SYSTOLIC ICA/CCA RATIO:  1.1 ECA:  85 cm/sec LEFT ICA: 149/26 cm/sec CCA: 14/97 cm/sec SYSTOLIC ICA/CCA RATIO:  1.6 ECA:  96 cm/sec RIGHT CAROTID ARTERY: Mild heterogeneous atherosclerotic plaque in the proximal internal carotid artery. By peak systolic velocity criteria,  the stenosis remains less than 50%. RIGHT VERTEBRAL ARTERY:  Patent with normal antegrade flow. LEFT CAROTID ARTERY: Mild focal heterogeneous atherosclerotic plaque in the proximal internal carotid artery. By peak systolic velocity criteria, the estimated stenosis remains less than 50%. There is spurious elevation of the peak systolic velocity in the more distal internal carotid artery in a region of tortuosity. LEFT VERTEBRAL ARTERY:  Patent with normal antegrade flow. Other: Pulsus bisferious waveform. IMPRESSION: 1. Mild (1-49%) stenosis proximal right internal carotid artery secondary to heterogenous atherosclerotic plaque. 2. Mild (1-49%) stenosis proximal left internal carotid artery secondary to heterogenous atherosclerotic  plaque. 3. Pulsus bisferious waveform suggests underlying aortic valvular disease such as aortic regurgitation or aortic stenosis with regurgitation. 4. Vertebral arteries are patent with antegrade flow. Signed, Criselda Peaches, MD [facility] Radiology Electronically Signed   By: Jacqulynn Cadet M.D.   On: 01/24/2018 14:10   Dg Chest Port 1 View  Result Date: 01/24/2018 CLINICAL DATA:  Respiratory distress EXAM: PORTABLE CHEST 1 VIEW COMPARISON:  01/22/2018, 11/29/2016 FINDINGS: No acute airspace disease or pleural effusion. Mild cardiomegaly. No pneumothorax. IMPRESSION: No acute airspace disease. Mild cardiomegaly, increased compared to prior. Electronically Signed   By: Donavan Foil M.D.   On: 01/24/2018 22:47   Dg Swallowing Func-speech Pathology  Result Date: 01/24/2018 Objective Swallowing Evaluation: Type of Study: MBS-Modified Barium Swallow Study  Patient Details Name: PAUL TORPEY MRN: [id number] Date of Birth: 1924/11/05 Today's Date: 01/24/2018 Time: SLP Start Time (ACUTE ONLY): 1415 -SLP Stop Time (ACUTE ONLY): 1500 SLP Time Calculation (min) (ACUTE ONLY): 45 min Past Medical History: Past Medical History: Diagnosis Date . Anemia  . Diverticulosis  . GI bleed   diverticular/hemorrhoidal . Hemorrhoids  . Hypertension  Past Surgical History: Past Surgical History: Procedure Laterality Date . CATARACT EXTRACTION W/PHACO  05/29/2012  Procedure: CATARACT EXTRACTION PHACO AND INTRAOCULAR LENS PLACEMENT (IOC);  Surgeon: Tonny Branch, MD;  Location: [facility];  Service: Ophthalmology;  Laterality: Right;  CDE:18.72 . CHOLECYSTECTOMY   . COLONOSCOPY N/A 06/24/2013  Significant diverticular disease. Large hemorrhoids. Terminal ileum normal. 2 rectosigmoid polyps (TA) removed. No old or fresh blood noted during exam. Likely had bleeding from diverticula and hemorrhoids. consider flex sig in 3 years. Marland Kitchen LEFT AND RIGHT HEART CATHETERIZATION WITH CORONARY ANGIOGRAM  N/A 11/23/2013  Procedure: LEFT AND RIGHT HEART CATHETERIZATION WITH CORONARY ANGIOGRAM;  Surgeon: Birdie Riddle, MD;  Location: [facility] CATH [facility];  Service: Cardiovascular;  Laterality: N/A; HPI: 84 y.o.femalewith medical history ofcognitive impairment, hypertension, diverticulosis, systolic CHF, coronary artery disease presenting with4-day history of generalized weakness. At baseline, the patient is independent with all her ADLs although the patient's niece states that she does have some memory problems. In the past month, her niece has noted that the patient has had increasing gait instability. However, because of her worsening generalized weakness, the patient was brought to emergency department on 01/22/2018. Patient improved clinically after IV fluids in the emergency department. She was sent home with cephalexin for treatment of UTI. However, the patient continued to remain somnolent with worsening generalized weakness to the point she was not able to get out of bed on the morning of 01/23/2018. As result, the patient was brought back to the emergency department for further evaluation. Because of her somnolence and generalized weakness, the patient has had decreased oral intake. In the emergency department, the patient was afebrile hemodynamically stable saturating 99% on room air. WBC was 12.2 with hemoglobin 11.7. BMP showed serum  creatinine 1.00. Otherwise electro lites were unremarkable. Troponin was 0.06. Urinalysis was negative for pyuria. Chest x-ray was negative for any acute findings. CT of the brain was negative for acute findings. MRI shows: Evidence of acute Large Vessel Occlusion at the Right MCA bifurcation. Decreased/absent flow in the anterior and middle right MCA divisions. Associated scattered acute small infarcts in the Right MCA territory, primarily the middle division. No associated hemorrhage or mass effect. Mild or at most moderate left MCA M1 stenosis, no other intracranial  large vessel abnormality. Small chronic left PCA and right cerebellar territory infarcts. Moderately advanced chronic white matter disease. BSE requested.  Subjective: "She's been holding phlegm in her mouth for a while now." Assessment / Plan / Recommendation CHL IP CLINICAL IMPRESSIONS 01/24/2018 Clinical Impression Pt presents with mild/mod oropharyngeal phase dysphagia and mod/severe pharyngoesophageal dysphagia due to large outpouching (suspect Zenker's but no radiologist present to confirm). Oral phase is characterized by oral transit delays, reduced bolus cohesiveness and premature spillage into pharynx; pharyngeal phase is marked by delay in swallow initiation with swallow trigger after spilling to the pyriforms with liquids and reduced cricopharyngeal relaxation with evidence of large outpouching suspicious for Zenker's diverticulum which collected thin, puree, and regular textures. Please see imaging tab for images of Zenker's. Pt protected airway reasonably well, but did have one episode of trace, silent aspiration of thin via straw sips. The presence of the large outpouching places Pt at significant risk of backflow into pharynx and eventual aspiration. Backflow from outpouching observed across consistencies and textures. Unfortunately, after discussion with MD, Pt is a poor candidate for Zenker's repair at this time given acute infarcts. Mrs. Stark is alert and responsive to questions today and tells her niece that she would not want a feeding tube and would like to eat by mouth. Recommend D3/mech soft with thin liquids via cup sips, avoid straws and encourage Pt to go slowly, take small bites, swallow several times for each swallow, and remain upright after meals for at least 30 minutes. Family understands that risk for aspiration is high at this time, in addition to dehydration/malnutrition. Recommend RD consult to assist with food choices. Suspect that Pt will tolerate liquids more efficiently than  solids and would recommend crushing pills as able in puree. SLP will follow during acute stay. Above to RN and MD.   SLP Visit Diagnosis Dysphagia, pharyngoesophageal phase (R13.14);Dysphagia, oropharyngeal phase (R13.12) Attention and concentration deficit following -- Frontal lobe and executive function deficit following -- Impact on safety and function --   CHL IP TREATMENT RECOMMENDATION 01/24/2018 Treatment Recommendations Therapy as outlined in treatment plan below   Prognosis 01/24/2018 Prognosis for Safe Diet Advancement Guarded Barriers to Reach Goals Severity of deficits Barriers/Prognosis Comment -- CHL IP DIET RECOMMENDATION 01/24/2018 SLP Diet Recommendations Dysphagia 3 (Mech soft) solids;Thin liquid Liquid Administration via Cup Medication Administration Crushed with puree Compensations Slow rate;Small sips/bites;Multiple dry swallows after each bite/sip Postural Changes Remain semi-upright after after feeds/meals (Comment);Seated upright at 90 degrees   CHL IP OTHER RECOMMENDATIONS 01/24/2018 Recommended Consults Consider esophageal assessment;Consider ENT evaluation Oral Care Recommendations Oral care BID;Staff/trained caregiver to provide oral care Other Recommendations Clarify dietary restrictions   [facility] IP FOLLOW UP RECOMMENDATIONS 01/24/2018 Follow up Recommendations Skilled Nursing facility   [facility] IP FREQUENCY AND DURATION 01/24/2018 Speech Therapy Frequency (ACUTE ONLY) min 2x/week Treatment Duration 1 week      CHL IP ORAL PHASE 01/24/2018 Oral Phase Impaired Oral - Pudding Teaspoon -- Oral - Pudding Cup -- Oral -  Honey Teaspoon -- Oral - Honey Cup -- Oral - Nectar Teaspoon -- Oral - Nectar Cup -- Oral - Nectar Straw -- Oral - Thin Teaspoon Premature spillage;Decreased bolus cohesion Oral - Thin Cup Premature spillage;Decreased bolus cohesion Oral - Thin Straw Premature spillage Oral - Puree Lingual/palatal residue;Holding of bolus;Piecemeal swallowing;Delayed oral transit;Decreased bolus cohesion Oral -  Mech Soft -- Oral - Regular Delayed oral transit Oral - Multi-Consistency -- Oral - Pill NT Oral Phase - Comment --  CHL IP PHARYNGEAL PHASE 01/24/2018 Pharyngeal Phase Impaired Pharyngeal- Pudding Teaspoon -- Pharyngeal -- Pharyngeal- Pudding Cup -- Pharyngeal -- Pharyngeal- Honey Teaspoon -- Pharyngeal -- Pharyngeal- Honey Cup -- Pharyngeal -- Pharyngeal- Nectar Teaspoon -- Pharyngeal -- Pharyngeal- Nectar Cup -- Pharyngeal -- Pharyngeal- Nectar Straw -- Pharyngeal -- Pharyngeal- Thin Teaspoon Delayed swallow initiation-pyriform sinuses;Pharyngeal residue - pyriform;Pharyngeal residue - cp segment Pharyngeal -- Pharyngeal- Thin Cup Delayed swallow initiation-pyriform sinuses;Penetration/Aspiration during swallow;Pharyngeal residue - pyriform;Pharyngeal residue - cp segment Pharyngeal Material does not enter airway;Material enters airway, remains ABOVE vocal cords then ejected out Pharyngeal- Thin Straw Delayed swallow initiation-pyriform sinuses;Reduced airway/laryngeal closure;Penetration/Apiration after swallow;Trace aspiration;Pharyngeal residue - valleculae;Pharyngeal residue - pyriform;Pharyngeal residue - cp segment Pharyngeal Material does not enter airway;Material enters airway, remains ABOVE vocal cords then ejected out;Material enters airway, passes BELOW cords then ejected out Pharyngeal- Puree Delayed swallow initiation-vallecula;Pharyngeal residue - valleculae;Pharyngeal residue - posterior pharnyx;Pharyngeal residue - cp segment;Pharyngeal residue - pyriform Pharyngeal -- Pharyngeal- Mechanical Soft -- Pharyngeal -- Pharyngeal- Regular Pharyngeal residue - pyriform Pharyngeal -- Pharyngeal- Multi-consistency -- Pharyngeal -- Pharyngeal- Pill -- Pharyngeal -- Pharyngeal Comment Pharyngeal phase negatively impacted by suspected Zenker's diverticulum and backflow into pharynx  CHL IP CERVICAL ESOPHAGEAL PHASE 01/24/2018 Cervical Esophageal Phase Impaired Pudding Teaspoon -- Pudding Cup -- Honey Teaspoon --  Honey Cup -- Nectar Teaspoon -- Nectar Cup -- Nectar Straw -- Thin Teaspoon Reduced cricopharyngeal relaxation;Prominent cricopharyngeal segment;Esophageal backflow into the pharynx Thin Cup -- Thin Straw -- Puree -- Mechanical Soft -- Regular Reduced cricopharyngeal relaxation;Prominent cricopharyngeal segment;Esophageal backflow into the pharynx Multi-consistency -- Pill -- Cervical Esophageal Comment Please see imaging for substantial outpouching near UES (suspect Zenker's diverticulum) Thank you, Genene Churn, [place] No flowsheet data found. PORTER,DABNEY 01/24/2018, 4:29 PM               Orson Eva, DO  [facility] Pager [phone]  If 7PM-7AM, please contact night-coverage [email or web address] Password [email or web address] 01/26/2018, 1:40 PM   LOS: 3 days

## 2018-01-27 DIAGNOSIS — E876 Hypokalemia: Secondary | ICD-10-CM | POA: Diagnosis not present

## 2018-01-27 DIAGNOSIS — Z7401 Bed confinement status: Secondary | ICD-10-CM | POA: Diagnosis not present

## 2018-01-27 DIAGNOSIS — I10 Essential (primary) hypertension: Secondary | ICD-10-CM | POA: Diagnosis not present

## 2018-01-27 DIAGNOSIS — I5042 Chronic combined systolic (congestive) and diastolic (congestive) heart failure: Secondary | ICD-10-CM | POA: Diagnosis not present

## 2018-01-27 DIAGNOSIS — R278 Other lack of coordination: Secondary | ICD-10-CM | POA: Diagnosis not present

## 2018-01-27 DIAGNOSIS — R531 Weakness: Secondary | ICD-10-CM | POA: Diagnosis not present

## 2018-01-27 DIAGNOSIS — I63411 Cerebral infarction due to embolism of right middle cerebral artery: Secondary | ICD-10-CM | POA: Diagnosis not present

## 2018-01-27 DIAGNOSIS — I63511 Cerebral infarction due to unspecified occlusion or stenosis of right middle cerebral artery: Secondary | ICD-10-CM | POA: Diagnosis not present

## 2018-01-27 DIAGNOSIS — G8194 Hemiplegia, unspecified affecting left nondominant side: Secondary | ICD-10-CM | POA: Diagnosis not present

## 2018-01-27 DIAGNOSIS — G9341 Metabolic encephalopathy: Secondary | ICD-10-CM | POA: Diagnosis not present

## 2018-01-27 DIAGNOSIS — J9691 Respiratory failure, unspecified with hypoxia: Secondary | ICD-10-CM | POA: Diagnosis not present

## 2018-01-27 DIAGNOSIS — I69391 Dysphagia following cerebral infarction: Secondary | ICD-10-CM | POA: Diagnosis not present

## 2018-01-27 DIAGNOSIS — I480 Paroxysmal atrial fibrillation: Secondary | ICD-10-CM | POA: Diagnosis not present

## 2018-01-27 DIAGNOSIS — J690 Pneumonitis due to inhalation of food and vomit: Secondary | ICD-10-CM | POA: Diagnosis not present

## 2018-01-27 DIAGNOSIS — E860 Dehydration: Secondary | ICD-10-CM | POA: Diagnosis not present

## 2018-01-27 LAB — BASIC METABOLIC PANEL
ANION GAP: 11 (ref 5–15)
BUN: 17 mg/dL (ref 8–23)
CHLORIDE: 104 mmol/L (ref 98–111)
CO2: 26 mmol/L (ref 22–32)
Calcium: 8.7 mg/dL — ABNORMAL LOW (ref 8.9–10.3)
Creatinine, Ser: 0.86 mg/dL (ref 0.44–1.00)
GFR calc non Af Amer: 57 mL/min — ABNORMAL LOW (ref >60)
Glucose, Bld: 83 mg/dL (ref 70–99)
Potassium: 3.3 mmol/L — ABNORMAL LOW (ref 3.5–5.1)
Sodium: 141 mmol/L (ref 135–145)

## 2018-01-27 MED ORDER — ATORVASTATIN CALCIUM 20 MG PO TABS: 20.0000 mg | ORAL_TABLET | Freq: Every day | ORAL | 0 refills | Status: AC

## 2018-01-27 MED ORDER — METOPROLOL TARTRATE 25 MG PO TABS
12.5000 mg | ORAL_TABLET | Freq: Two times a day (BID) | ORAL | Status: DC
  Administered 2018-01-27: 12.5 mg via ORAL
  Filled 2018-01-27: qty 1

## 2018-01-27 MED ORDER — POTASSIUM CHLORIDE CRYS ER 10 MEQ PO TBCR: 10.0000 meq | EXTENDED_RELEASE_TABLET | Freq: Two times a day (BID) | ORAL | 0 refills | Status: AC

## 2018-01-27 MED ORDER — AMOXICILLIN-POT CLAVULANATE 875-125 MG PO TABS
1.0000 | ORAL_TABLET | Freq: Two times a day (BID) | ORAL | Status: DC
  Administered 2018-01-27: 1 via ORAL
  Filled 2018-01-27: qty 1

## 2018-01-27 MED ORDER — ATORVASTATIN CALCIUM 20 MG PO TABS: 20.0000 mg | ORAL_TABLET | Freq: Every day | ORAL | Status: DC

## 2018-01-27 MED ORDER — AMOXICILLIN-POT CLAVULANATE 875-125 MG PO TABS: 1.0000 | ORAL_TABLET | Freq: Two times a day (BID) | ORAL | 0 refills | Status: AC

## 2018-01-27 MED ORDER — MAGNESIUM SULFATE 2 GM/50ML IV SOLN
2.0000 g | Freq: Once | INTRAVENOUS | Status: AC
  Administered 2018-01-27: 2 g via INTRAVENOUS
  Filled 2018-01-27: qty 50

## 2018-01-27 NOTE — Clinical Social Work Note (Signed)
Clinical Social Work Assessment  Patient Details  Name: Lynn Weaver MRN: [id number] Date of Birth: Mar 01, 1925  Date of referral:  01/27/18               Reason for consult:  Facility Placement                Permission sought to share information with:    Permission granted to share information::     Name::        Agency::     Relationship::     Contact Information:  niece, Quintin Alto, was at bedside  Housing/Transportation Living arrangements for the past 2 months:    Source of Information:  Other (Comment Required)(Niece Orthoptist ) Patient Interpreter Needed:  None Criminal Activity/Legal Involvement Pertinent to Current Situation/Hospitalization:  No - Comment as needed Significant Relationships:  Other Family Members Lives with:  Self Do you feel safe going back to the place where you live?  Yes Need for family participation in patient care:  Yes (Comment)  Care giving concerns:  None identified at baseline.    Social Worker assessment / plan:  At baseline, patient lives alone.  Patient ambulates with a cane in the community and unassisted in the home. At baseline, patient cooks, cleans and does her own laundry. Family is agreeable to short term rehab at [facility].   Employment status:  Disabled (Comment on whether or not currently receiving Disability) Insurance information:  Managed Medicare PT Recommendations:  [place] / Referral to community resources:  [place]  Patient/Family's Response to care:  Patient's family is agreeable to short term rehab at [facility].   Patient/Family's Understanding of and Emotional Response to Diagnosis, Current Treatment, and Prognosis:  Family and patient understand patient's diagnosis, treatment and prognosis.   Emotional Assessment Appearance:  Appears stated age Attitude/Demeanor/Rapport:    Affect (typically observed):  Unable to Assess Orientation:  Oriented to Self, Oriented to Place Alcohol /  Substance use:  Not Applicable Psych involvement (Current and /or in the community):  No (Comment)  Discharge Needs  Concerns to be addressed:  Discharge Planning Concerns Readmission within the last 30 days:  No Current discharge risk:  None Barriers to Discharge:  No Barriers Identified   Ihor Gully, LCSW 01/27/2018, 3:08 PM

## 2018-01-27 NOTE — Progress Notes (Signed)
Palliative: Lynn Weaver is resting comfortably, no complaints.  Goals are set, rehab when approved by insurance company. No further needs unless decline noted. No charge. Quinn Axe, NP Palliative Medicine Team Team Phone # [phone]

## 2018-01-27 NOTE — Care Management Important Message (Signed)
Important Message  Patient Details  Name: Lynn Weaver MRN: [id number] Date of Birth: 12-14-1924   Medicare Important Message Given:  Yes    Arling Cerone, Chauncey Reading, RN 01/27/2018, 12:46 PM

## 2018-01-27 NOTE — NC FL2 (Signed)
[place] LEVEL OF CARE SCREENING TOOL     IDENTIFICATION  Patient Name: Lynn Weaver Birthdate: 1924/10/10 Sex: female Admission Date (Current Location): 01/23/2018  [facility] and [place] Number:  [employer] and Address:  [place] [address], [place]      Provider Number: [id number]  Attending Physician Name and Address:  Orson Eva, MD  Relative Name and Phone Number:       Current Level of Care: Hospital Recommended Level of Care: [place] Prior Approval Number:    Date Approved/Denied:   PASRR Number: [id number] )  Discharge Plan: SNF    Current Diagnoses: Patient Active Problem List   Diagnosis Date Noted  . AF (paroxysmal atrial fibrillation) ([place]) 01/25/2018  . Acute right arterial ischemic stroke, middle cerebral artery (MCA) ([place]) 01/25/2018  . Generalized weakness   . Goals of care, counseling/discussion   . Palliative care by specialist   . Dehydration 01/23/2018  . Chronic systolic CHF (congestive heart failure) ([place]) 01/23/2018  . Chronic combined systolic and diastolic CHF (congestive heart failure) ([place]) 01/23/2018  . Acute metabolic encephalopathy 62/83/1517  . Left hemiparesis ([place]) 01/23/2018  . Aspiration pneumonitis ([place]) 01/23/2018  . Dysphagia, idiopathic 09/12/2016  . Non-STEMI (non-ST elevated myocardial infarction) ([place]) 11/17/2013  . Acute systolic CHF (congestive heart failure) ([place]) 11/17/2013  . BRBPR (bright red blood per rectum) 06/23/2013  . Hypertension     Orientation RESPIRATION BLADDER Height & Weight     Self, Place  Normal Continent Weight: 104 lb 0.9 oz (47.2 kg) Height:  5\' 5"  (165.1 cm)  BEHAVIORAL SYMPTOMS/MOOD NEUROLOGICAL BOWEL NUTRITION STATUS      Continent Diet(DYS 3. See discharge summary)  AMBULATORY STATUS COMMUNICATION OF NEEDS Skin   Extensive Assist Verbally Normal                       Personal Care Assistance  Level of Assistance  Bathing, Feeding, Dressing Bathing Assistance: Limited assistance Feeding assistance: Independent Dressing Assistance: Limited assistance     Functional Limitations Info  Sight, Hearing, Speech Sight Info: Adequate Hearing Info: Adequate Speech Info: Adequate    SPECIAL CARE FACTORS FREQUENCY  PT (By licensed PT)     PT Frequency: 5xweel              Contractures Contractures Info: Not present    Additional Factors Info  Code Status, Allergies Code Status Info: DNR Allergies Info: Fish allergy           Current Medications (01/27/2018):  This is the current hospital active medication list Current Facility-Administered Medications  Medication Dose Route Frequency Provider Last Rate Last Dose  . 0.9 %  sodium chloride infusion   Intravenous Continuous Tat, Shanon Brow, MD 10 mL/hr at 01/26/18 0950    . acetaminophen (TYLENOL) tablet 650 mg  650 mg Oral Q6H PRN Reubin Milan, MD       Or  . acetaminophen (TYLENOL) suppository 650 mg  650 mg Rectal Q6H PRN Reubin Milan, MD      . amoxicillin-clavulanate (AUGMENTIN) 875-125 MG per tablet 1 tablet  1 tablet Oral Therisa Doyne, MD   1 tablet at 01/27/18 [phone]  . apixaban (ELIQUIS) tablet 2.5 mg  2.5 mg Oral BID Orson Eva, MD   2.5 mg at 01/27/18 0936  . aspirin EC tablet 81 mg  81 mg Oral Daily Tat, Shanon Brow, MD   81 mg at 01/27/18 0936  .  atorvastatin (LIPITOR) tablet 20 mg  20 mg Oral q1800 Tat, David, MD      . diltiazem (CARDIZEM) 100 mg in dextrose 5% 148mL (1 mg/mL) infusion  5-15 mg/hr Intravenous Titrated Reubin Milan, MD   Stopped at 01/25/18 [phone]  . hydrALAZINE (APRESOLINE) injection 5 mg  5 mg Intravenous Q6H PRN Reubin Milan, MD   5 mg at 01/27/18 0026  . ipratropium-albuterol (DUONEB) 0.5-2.5 (3) MG/3ML nebulizer solution 3 mL  3 mL Nebulization Q6H PRN Reubin Milan, MD      . MEDLINE mouth rinse  15 mL Mouth Rinse BID Reubin Milan, MD   15 mL at 01/27/18 3846   . metoprolol tartrate (LOPRESSOR) tablet 12.5 mg  12.5 mg Oral BID Orson Eva, MD   12.5 mg at 01/27/18 0936  . ondansetron (ZOFRAN) tablet 4 mg  4 mg Oral Q6H PRN Reubin Milan, MD       Or  . ondansetron [facility]) injection 4 mg  4 mg Intravenous Q6H PRN Reubin Milan, MD      . polyethylene glycol (MIRALAX / Floria Raveling) packet 17 g  17 g Oral Daily Tat, Shanon Brow, MD   17 g at 01/26/18 1731  . senna (SENOKOT) tablet 17.2 mg  2 tablet Oral Daily Tat, David, MD   17.2 mg at 01/26/18 1730  . sodium chloride flush (NS) 0.9 % injection 3 mL  3 mL Intravenous Therisa Doyne, MD   3 mL at 01/27/18 0937  . sodium chloride flush (NS) 0.9 % injection 3 mL  3 mL Intravenous PRN Tat, Shanon Brow, MD   3 mL at 01/25/18 1050  . vitamin B-12 (CYANOCOBALAMIN) tablet 500 mcg  500 mcg Oral Daily Tat, David, MD   500 mcg at 01/27/18 6599     Discharge Medications: Please see discharge summary for a list of discharge medications.  Relevant Imaging Results:  Relevant Lab Results:   Additional Information    Aften Lipsey, Clydene Pugh, LCSW

## 2018-01-27 NOTE — Clinical Social Work Placement (Signed)
   CLINICAL SOCIAL WORK PLACEMENT  NOTE  Date:  01/27/2018  Patient Details  Name: Lynn Weaver MRN: [id number] Date of Birth: April 07, 1925  Clinical Social Work is seeking post-discharge placement for this patient at the   level of care (*CSW will initial, date and re-position this form in  chart as items are completed):  Yes   Patient/family provided with [place] Work Department's list of facilities offering this level of care within the geographic area requested by the patient (or if unable, by the patient's family).  Yes   Patient/family informed of their freedom to choose among providers that offer the needed level of care, that participate in Medicare, Medicaid or managed care program needed by the patient, have an available bed and are willing to accept the patient.  Yes   Patient/family informed of [facility]'s ownership interest in [facility] and [facility], as well as of the fact that they are under no obligation to receive care at these facilities.  PASRR submitted to EDS on 01/27/18     PASRR number received on 01/27/18     Existing PASRR number confirmed on       FL2 transmitted to all facilities in geographic area requested by pt/family on 01/27/18     FL2 transmitted to all facilities within larger geographic area on       Patient informed that his/her managed care company has contracts with or will negotiate with certain facilities, including the following:        Yes   Patient/family informed of bed offers received.  Patient [facility] bed at [facility]     Physician recommends and patient chooses bed at      Patient to be [facility] on 01/27/18.  Patient to be transferred to facility by [facility]     Patient family notified on 01/27/18 of transfer.  Name of family member notified:  niece, Antionnette Nori Riis, informed and will go to facility to sign paperwork.      PHYSICIAN       Additional Comment:  Discharge clinicals sent. LCSW signing off.   _______________________________________________ Ihor Gully, LCSW 01/27/2018, 4:06 PM

## 2018-01-27 NOTE — Progress Notes (Signed)
Brief initial visit for support.

## 2018-02-01 DIAGNOSIS — I63411 Cerebral infarction due to embolism of right middle cerebral artery: Secondary | ICD-10-CM | POA: Diagnosis not present

## 2018-02-01 DIAGNOSIS — I480 Paroxysmal atrial fibrillation: Secondary | ICD-10-CM | POA: Diagnosis not present

## 2018-02-01 DIAGNOSIS — I10 Essential (primary) hypertension: Secondary | ICD-10-CM | POA: Diagnosis not present

## 2018-02-01 DIAGNOSIS — E876 Hypokalemia: Secondary | ICD-10-CM | POA: Diagnosis not present

## 2018-02-17 DIAGNOSIS — M6281 Muscle weakness (generalized): Secondary | ICD-10-CM | POA: Diagnosis not present

## 2018-02-17 DIAGNOSIS — I69398 Other sequelae of cerebral infarction: Secondary | ICD-10-CM | POA: Diagnosis not present

## 2018-02-17 DIAGNOSIS — I69391 Dysphagia following cerebral infarction: Secondary | ICD-10-CM | POA: Diagnosis not present

## 2018-02-17 DIAGNOSIS — I69311 Memory deficit following cerebral infarction: Secondary | ICD-10-CM | POA: Diagnosis not present

## 2018-02-17 DIAGNOSIS — I2510 Atherosclerotic heart disease of native coronary artery without angina pectoris: Secondary | ICD-10-CM | POA: Diagnosis not present

## 2018-02-17 DIAGNOSIS — I110 Hypertensive heart disease with heart failure: Secondary | ICD-10-CM | POA: Diagnosis not present

## 2018-02-17 DIAGNOSIS — I5042 Chronic combined systolic (congestive) and diastolic (congestive) heart failure: Secondary | ICD-10-CM | POA: Diagnosis not present

## 2018-02-17 DIAGNOSIS — R1310 Dysphagia, unspecified: Secondary | ICD-10-CM | POA: Diagnosis not present

## 2018-02-17 DIAGNOSIS — K225 Diverticulum of esophagus, acquired: Secondary | ICD-10-CM | POA: Diagnosis not present

## 2018-02-18 DIAGNOSIS — I2510 Atherosclerotic heart disease of native coronary artery without angina pectoris: Secondary | ICD-10-CM | POA: Diagnosis not present

## 2018-02-18 DIAGNOSIS — K225 Diverticulum of esophagus, acquired: Secondary | ICD-10-CM | POA: Diagnosis not present

## 2018-02-18 DIAGNOSIS — I69391 Dysphagia following cerebral infarction: Secondary | ICD-10-CM | POA: Diagnosis not present

## 2018-02-18 DIAGNOSIS — I69311 Memory deficit following cerebral infarction: Secondary | ICD-10-CM | POA: Diagnosis not present

## 2018-02-18 DIAGNOSIS — R1310 Dysphagia, unspecified: Secondary | ICD-10-CM | POA: Diagnosis not present

## 2018-02-18 DIAGNOSIS — M6281 Muscle weakness (generalized): Secondary | ICD-10-CM | POA: Diagnosis not present

## 2018-02-18 DIAGNOSIS — I69398 Other sequelae of cerebral infarction: Secondary | ICD-10-CM | POA: Diagnosis not present

## 2018-02-18 DIAGNOSIS — I5042 Chronic combined systolic (congestive) and diastolic (congestive) heart failure: Secondary | ICD-10-CM | POA: Diagnosis not present

## 2018-02-18 DIAGNOSIS — I110 Hypertensive heart disease with heart failure: Secondary | ICD-10-CM | POA: Diagnosis not present

## 2018-02-20 DIAGNOSIS — I69311 Memory deficit following cerebral infarction: Secondary | ICD-10-CM | POA: Diagnosis not present

## 2018-02-20 DIAGNOSIS — K225 Diverticulum of esophagus, acquired: Secondary | ICD-10-CM | POA: Diagnosis not present

## 2018-02-20 DIAGNOSIS — R1310 Dysphagia, unspecified: Secondary | ICD-10-CM | POA: Diagnosis not present

## 2018-02-20 DIAGNOSIS — I5042 Chronic combined systolic (congestive) and diastolic (congestive) heart failure: Secondary | ICD-10-CM | POA: Diagnosis not present

## 2018-02-20 DIAGNOSIS — I2510 Atherosclerotic heart disease of native coronary artery without angina pectoris: Secondary | ICD-10-CM | POA: Diagnosis not present

## 2018-02-20 DIAGNOSIS — I110 Hypertensive heart disease with heart failure: Secondary | ICD-10-CM | POA: Diagnosis not present

## 2018-02-20 DIAGNOSIS — I69398 Other sequelae of cerebral infarction: Secondary | ICD-10-CM | POA: Diagnosis not present

## 2018-02-20 DIAGNOSIS — M6281 Muscle weakness (generalized): Secondary | ICD-10-CM | POA: Diagnosis not present

## 2018-02-20 DIAGNOSIS — I69391 Dysphagia following cerebral infarction: Secondary | ICD-10-CM | POA: Diagnosis not present

## 2018-02-24 DIAGNOSIS — R1310 Dysphagia, unspecified: Secondary | ICD-10-CM | POA: Diagnosis not present

## 2018-02-24 DIAGNOSIS — M6281 Muscle weakness (generalized): Secondary | ICD-10-CM | POA: Diagnosis not present

## 2018-02-24 DIAGNOSIS — I69391 Dysphagia following cerebral infarction: Secondary | ICD-10-CM | POA: Diagnosis not present

## 2018-02-24 DIAGNOSIS — I69311 Memory deficit following cerebral infarction: Secondary | ICD-10-CM | POA: Diagnosis not present

## 2018-02-24 DIAGNOSIS — I2510 Atherosclerotic heart disease of native coronary artery without angina pectoris: Secondary | ICD-10-CM | POA: Diagnosis not present

## 2018-02-24 DIAGNOSIS — I110 Hypertensive heart disease with heart failure: Secondary | ICD-10-CM | POA: Diagnosis not present

## 2018-02-24 DIAGNOSIS — I5042 Chronic combined systolic (congestive) and diastolic (congestive) heart failure: Secondary | ICD-10-CM | POA: Diagnosis not present

## 2018-02-24 DIAGNOSIS — K225 Diverticulum of esophagus, acquired: Secondary | ICD-10-CM | POA: Diagnosis not present

## 2018-02-24 DIAGNOSIS — I69398 Other sequelae of cerebral infarction: Secondary | ICD-10-CM | POA: Diagnosis not present

## 2018-02-25 DIAGNOSIS — I2510 Atherosclerotic heart disease of native coronary artery without angina pectoris: Secondary | ICD-10-CM | POA: Diagnosis not present

## 2018-02-25 DIAGNOSIS — M6281 Muscle weakness (generalized): Secondary | ICD-10-CM | POA: Diagnosis not present

## 2018-02-25 DIAGNOSIS — I110 Hypertensive heart disease with heart failure: Secondary | ICD-10-CM | POA: Diagnosis not present

## 2018-02-25 DIAGNOSIS — I69398 Other sequelae of cerebral infarction: Secondary | ICD-10-CM | POA: Diagnosis not present

## 2018-02-25 DIAGNOSIS — I69391 Dysphagia following cerebral infarction: Secondary | ICD-10-CM | POA: Diagnosis not present

## 2018-02-25 DIAGNOSIS — I69311 Memory deficit following cerebral infarction: Secondary | ICD-10-CM | POA: Diagnosis not present

## 2018-02-25 DIAGNOSIS — K225 Diverticulum of esophagus, acquired: Secondary | ICD-10-CM | POA: Diagnosis not present

## 2018-02-25 DIAGNOSIS — I5042 Chronic combined systolic (congestive) and diastolic (congestive) heart failure: Secondary | ICD-10-CM | POA: Diagnosis not present

## 2018-02-25 DIAGNOSIS — R1310 Dysphagia, unspecified: Secondary | ICD-10-CM | POA: Diagnosis not present

## 2018-02-26 DIAGNOSIS — I69398 Other sequelae of cerebral infarction: Secondary | ICD-10-CM | POA: Diagnosis not present

## 2018-02-26 DIAGNOSIS — I5042 Chronic combined systolic (congestive) and diastolic (congestive) heart failure: Secondary | ICD-10-CM | POA: Diagnosis not present

## 2018-02-26 DIAGNOSIS — I69391 Dysphagia following cerebral infarction: Secondary | ICD-10-CM | POA: Diagnosis not present

## 2018-02-26 DIAGNOSIS — I2510 Atherosclerotic heart disease of native coronary artery without angina pectoris: Secondary | ICD-10-CM | POA: Diagnosis not present

## 2018-02-26 DIAGNOSIS — M6281 Muscle weakness (generalized): Secondary | ICD-10-CM | POA: Diagnosis not present

## 2018-02-26 DIAGNOSIS — K225 Diverticulum of esophagus, acquired: Secondary | ICD-10-CM | POA: Diagnosis not present

## 2018-02-26 DIAGNOSIS — I69311 Memory deficit following cerebral infarction: Secondary | ICD-10-CM | POA: Diagnosis not present

## 2018-02-26 DIAGNOSIS — I110 Hypertensive heart disease with heart failure: Secondary | ICD-10-CM | POA: Diagnosis not present

## 2018-02-26 DIAGNOSIS — R1310 Dysphagia, unspecified: Secondary | ICD-10-CM | POA: Diagnosis not present

## 2018-02-28 DIAGNOSIS — I110 Hypertensive heart disease with heart failure: Secondary | ICD-10-CM | POA: Diagnosis not present

## 2018-02-28 DIAGNOSIS — K225 Diverticulum of esophagus, acquired: Secondary | ICD-10-CM | POA: Diagnosis not present

## 2018-02-28 DIAGNOSIS — I69311 Memory deficit following cerebral infarction: Secondary | ICD-10-CM | POA: Diagnosis not present

## 2018-02-28 DIAGNOSIS — I69398 Other sequelae of cerebral infarction: Secondary | ICD-10-CM | POA: Diagnosis not present

## 2018-02-28 DIAGNOSIS — I69391 Dysphagia following cerebral infarction: Secondary | ICD-10-CM | POA: Diagnosis not present

## 2018-02-28 DIAGNOSIS — I5042 Chronic combined systolic (congestive) and diastolic (congestive) heart failure: Secondary | ICD-10-CM | POA: Diagnosis not present

## 2018-02-28 DIAGNOSIS — R1310 Dysphagia, unspecified: Secondary | ICD-10-CM | POA: Diagnosis not present

## 2018-02-28 DIAGNOSIS — I2510 Atherosclerotic heart disease of native coronary artery without angina pectoris: Secondary | ICD-10-CM | POA: Diagnosis not present

## 2018-02-28 DIAGNOSIS — M6281 Muscle weakness (generalized): Secondary | ICD-10-CM | POA: Diagnosis not present

## 2018-03-03 DIAGNOSIS — M6281 Muscle weakness (generalized): Secondary | ICD-10-CM | POA: Diagnosis not present

## 2018-03-03 DIAGNOSIS — K225 Diverticulum of esophagus, acquired: Secondary | ICD-10-CM | POA: Diagnosis not present

## 2018-03-03 DIAGNOSIS — I69398 Other sequelae of cerebral infarction: Secondary | ICD-10-CM | POA: Diagnosis not present

## 2018-03-03 DIAGNOSIS — I69311 Memory deficit following cerebral infarction: Secondary | ICD-10-CM | POA: Diagnosis not present

## 2018-03-03 DIAGNOSIS — R1310 Dysphagia, unspecified: Secondary | ICD-10-CM | POA: Diagnosis not present

## 2018-03-03 DIAGNOSIS — I69391 Dysphagia following cerebral infarction: Secondary | ICD-10-CM | POA: Diagnosis not present

## 2018-03-03 DIAGNOSIS — I110 Hypertensive heart disease with heart failure: Secondary | ICD-10-CM | POA: Diagnosis not present

## 2018-03-03 DIAGNOSIS — I5042 Chronic combined systolic (congestive) and diastolic (congestive) heart failure: Secondary | ICD-10-CM | POA: Diagnosis not present

## 2018-03-03 DIAGNOSIS — I2510 Atherosclerotic heart disease of native coronary artery without angina pectoris: Secondary | ICD-10-CM | POA: Diagnosis not present

## 2018-03-04 DIAGNOSIS — M6281 Muscle weakness (generalized): Secondary | ICD-10-CM | POA: Diagnosis not present

## 2018-03-04 DIAGNOSIS — I69398 Other sequelae of cerebral infarction: Secondary | ICD-10-CM | POA: Diagnosis not present

## 2018-03-04 DIAGNOSIS — I5042 Chronic combined systolic (congestive) and diastolic (congestive) heart failure: Secondary | ICD-10-CM | POA: Diagnosis not present

## 2018-03-04 DIAGNOSIS — I69391 Dysphagia following cerebral infarction: Secondary | ICD-10-CM | POA: Diagnosis not present

## 2018-03-04 DIAGNOSIS — K225 Diverticulum of esophagus, acquired: Secondary | ICD-10-CM | POA: Diagnosis not present

## 2018-03-04 DIAGNOSIS — I2510 Atherosclerotic heart disease of native coronary artery without angina pectoris: Secondary | ICD-10-CM | POA: Diagnosis not present

## 2018-03-04 DIAGNOSIS — I69311 Memory deficit following cerebral infarction: Secondary | ICD-10-CM | POA: Diagnosis not present

## 2018-03-04 DIAGNOSIS — R1310 Dysphagia, unspecified: Secondary | ICD-10-CM | POA: Diagnosis not present

## 2018-03-04 DIAGNOSIS — I110 Hypertensive heart disease with heart failure: Secondary | ICD-10-CM | POA: Diagnosis not present

## 2018-03-06 DIAGNOSIS — R1310 Dysphagia, unspecified: Secondary | ICD-10-CM | POA: Diagnosis not present

## 2018-03-06 DIAGNOSIS — I2510 Atherosclerotic heart disease of native coronary artery without angina pectoris: Secondary | ICD-10-CM | POA: Diagnosis not present

## 2018-03-06 DIAGNOSIS — I69311 Memory deficit following cerebral infarction: Secondary | ICD-10-CM | POA: Diagnosis not present

## 2018-03-06 DIAGNOSIS — I69391 Dysphagia following cerebral infarction: Secondary | ICD-10-CM | POA: Diagnosis not present

## 2018-03-06 DIAGNOSIS — I69398 Other sequelae of cerebral infarction: Secondary | ICD-10-CM | POA: Diagnosis not present

## 2018-03-06 DIAGNOSIS — K225 Diverticulum of esophagus, acquired: Secondary | ICD-10-CM | POA: Diagnosis not present

## 2018-03-06 DIAGNOSIS — M6281 Muscle weakness (generalized): Secondary | ICD-10-CM | POA: Diagnosis not present

## 2018-03-06 DIAGNOSIS — I5042 Chronic combined systolic (congestive) and diastolic (congestive) heart failure: Secondary | ICD-10-CM | POA: Diagnosis not present

## 2018-03-06 DIAGNOSIS — I110 Hypertensive heart disease with heart failure: Secondary | ICD-10-CM | POA: Diagnosis not present

## 2018-03-07 DIAGNOSIS — I69391 Dysphagia following cerebral infarction: Secondary | ICD-10-CM | POA: Diagnosis not present

## 2018-03-07 DIAGNOSIS — I5042 Chronic combined systolic (congestive) and diastolic (congestive) heart failure: Secondary | ICD-10-CM | POA: Diagnosis not present

## 2018-03-07 DIAGNOSIS — I2510 Atherosclerotic heart disease of native coronary artery without angina pectoris: Secondary | ICD-10-CM | POA: Diagnosis not present

## 2018-03-07 DIAGNOSIS — M6281 Muscle weakness (generalized): Secondary | ICD-10-CM | POA: Diagnosis not present

## 2018-03-07 DIAGNOSIS — I69311 Memory deficit following cerebral infarction: Secondary | ICD-10-CM | POA: Diagnosis not present

## 2018-03-07 DIAGNOSIS — R1310 Dysphagia, unspecified: Secondary | ICD-10-CM | POA: Diagnosis not present

## 2018-03-07 DIAGNOSIS — I69398 Other sequelae of cerebral infarction: Secondary | ICD-10-CM | POA: Diagnosis not present

## 2018-03-07 DIAGNOSIS — I110 Hypertensive heart disease with heart failure: Secondary | ICD-10-CM | POA: Diagnosis not present

## 2018-03-07 DIAGNOSIS — K225 Diverticulum of esophagus, acquired: Secondary | ICD-10-CM | POA: Diagnosis not present

## 2018-03-11 DIAGNOSIS — I69398 Other sequelae of cerebral infarction: Secondary | ICD-10-CM | POA: Diagnosis not present

## 2018-03-11 DIAGNOSIS — I110 Hypertensive heart disease with heart failure: Secondary | ICD-10-CM | POA: Diagnosis not present

## 2018-03-11 DIAGNOSIS — I69311 Memory deficit following cerebral infarction: Secondary | ICD-10-CM | POA: Diagnosis not present

## 2018-03-11 DIAGNOSIS — I2510 Atherosclerotic heart disease of native coronary artery without angina pectoris: Secondary | ICD-10-CM | POA: Diagnosis not present

## 2018-03-11 DIAGNOSIS — K225 Diverticulum of esophagus, acquired: Secondary | ICD-10-CM | POA: Diagnosis not present

## 2018-03-11 DIAGNOSIS — R1310 Dysphagia, unspecified: Secondary | ICD-10-CM | POA: Diagnosis not present

## 2018-03-11 DIAGNOSIS — I69391 Dysphagia following cerebral infarction: Secondary | ICD-10-CM | POA: Diagnosis not present

## 2018-03-11 DIAGNOSIS — I5042 Chronic combined systolic (congestive) and diastolic (congestive) heart failure: Secondary | ICD-10-CM | POA: Diagnosis not present

## 2018-03-11 DIAGNOSIS — M6281 Muscle weakness (generalized): Secondary | ICD-10-CM | POA: Diagnosis not present

## 2018-03-14 DIAGNOSIS — I69391 Dysphagia following cerebral infarction: Secondary | ICD-10-CM | POA: Diagnosis not present

## 2018-03-14 DIAGNOSIS — R1310 Dysphagia, unspecified: Secondary | ICD-10-CM | POA: Diagnosis not present

## 2018-03-14 DIAGNOSIS — I5042 Chronic combined systolic (congestive) and diastolic (congestive) heart failure: Secondary | ICD-10-CM | POA: Diagnosis not present

## 2018-03-14 DIAGNOSIS — I69311 Memory deficit following cerebral infarction: Secondary | ICD-10-CM | POA: Diagnosis not present

## 2018-03-14 DIAGNOSIS — I69398 Other sequelae of cerebral infarction: Secondary | ICD-10-CM | POA: Diagnosis not present

## 2018-03-14 DIAGNOSIS — I2510 Atherosclerotic heart disease of native coronary artery without angina pectoris: Secondary | ICD-10-CM | POA: Diagnosis not present

## 2018-03-14 DIAGNOSIS — M6281 Muscle weakness (generalized): Secondary | ICD-10-CM | POA: Diagnosis not present

## 2018-03-14 DIAGNOSIS — K225 Diverticulum of esophagus, acquired: Secondary | ICD-10-CM | POA: Diagnosis not present

## 2018-03-14 DIAGNOSIS — I110 Hypertensive heart disease with heart failure: Secondary | ICD-10-CM | POA: Diagnosis not present

## 2018-03-26 DIAGNOSIS — I4891 Unspecified atrial fibrillation: Secondary | ICD-10-CM | POA: Diagnosis not present

## 2018-03-26 DIAGNOSIS — Z681 Body mass index (BMI) 19 or less, adult: Secondary | ICD-10-CM | POA: Diagnosis not present

## 2018-03-26 DIAGNOSIS — Z1389 Encounter for screening for other disorder: Secondary | ICD-10-CM | POA: Diagnosis not present

## 2018-03-26 DIAGNOSIS — Z0001 Encounter for general adult medical examination with abnormal findings: Secondary | ICD-10-CM | POA: Diagnosis not present

## 2018-03-26 DIAGNOSIS — E782 Mixed hyperlipidemia: Secondary | ICD-10-CM | POA: Diagnosis not present

## 2018-03-26 DIAGNOSIS — I10 Essential (primary) hypertension: Secondary | ICD-10-CM | POA: Diagnosis not present

## 2018-08-04 ENCOUNTER — Emergency Department (HOSPITAL_COMMUNITY): Payer: Medicare PPO

## 2018-08-04 ENCOUNTER — Emergency Department (HOSPITAL_COMMUNITY)
Admission: EM | Admit: 2018-08-04 | Discharge: 2018-08-23 | Disposition: E | Payer: Medicare PPO | Attending: Emergency Medicine | Admitting: Emergency Medicine

## 2018-08-04 DIAGNOSIS — I110 Hypertensive heart disease with heart failure: Secondary | ICD-10-CM | POA: Insufficient documentation

## 2018-08-04 DIAGNOSIS — I469 Cardiac arrest, cause unspecified: Secondary | ICD-10-CM | POA: Diagnosis not present

## 2018-08-04 DIAGNOSIS — I5022 Chronic systolic (congestive) heart failure: Secondary | ICD-10-CM | POA: Diagnosis not present

## 2018-08-04 DIAGNOSIS — I10 Essential (primary) hypertension: Secondary | ICD-10-CM | POA: Diagnosis not present

## 2018-08-04 DIAGNOSIS — R404 Transient alteration of awareness: Secondary | ICD-10-CM | POA: Diagnosis not present

## 2018-08-04 DIAGNOSIS — G4700 Insomnia, unspecified: Secondary | ICD-10-CM | POA: Diagnosis not present

## 2018-08-04 DIAGNOSIS — R600 Localized edema: Secondary | ICD-10-CM | POA: Diagnosis not present

## 2018-08-04 DIAGNOSIS — I499 Cardiac arrhythmia, unspecified: Secondary | ICD-10-CM | POA: Diagnosis not present

## 2018-08-04 DIAGNOSIS — R0689 Other abnormalities of breathing: Secondary | ICD-10-CM | POA: Diagnosis not present

## 2018-08-04 DIAGNOSIS — Z681 Body mass index (BMI) 19 or less, adult: Secondary | ICD-10-CM | POA: Diagnosis not present

## 2018-08-04 LAB — CBC WITH DIFFERENTIAL/PLATELET
Abs Immature Granulocytes: 0.06 10*3/uL (ref 0.00–0.07)
Basophils Absolute: 0 10*3/uL (ref 0.0–0.1)
Basophils Relative: 0 %
Eosinophils Absolute: 0.3 10*3/uL (ref 0.0–0.5)
Eosinophils Relative: 3 %
HCT: 28.2 % — ABNORMAL LOW (ref 36.0–46.0)
Hemoglobin: 8 g/dL — ABNORMAL LOW (ref 12.0–15.0)
Immature Granulocytes: 1 %
Lymphocytes Relative: 73 %
Lymphs Abs: 6.8 10*3/uL — ABNORMAL HIGH (ref 0.7–4.0)
MCH: 28.5 pg (ref 26.0–34.0)
MCHC: 28.4 g/dL — ABNORMAL LOW (ref 30.0–36.0)
MCV: 100.4 fL — ABNORMAL HIGH (ref 80.0–100.0)
Monocytes Absolute: 0.6 10*3/uL (ref 0.1–1.0)
Monocytes Relative: 6 %
Neutro Abs: 1.6 10*3/uL — ABNORMAL LOW (ref 1.7–7.7)
Neutrophils Relative %: 17 %
PLATELETS: 215 10*3/uL (ref 150–400)
RBC: 2.81 MIL/uL — ABNORMAL LOW (ref 3.87–5.11)
RDW: 14.3 % (ref 11.5–15.5)
WBC: 9.4 10*3/uL (ref 4.0–10.5)
nRBC: 0.3 % — ABNORMAL HIGH (ref 0.0–0.2)

## 2018-08-04 LAB — COMPREHENSIVE METABOLIC PANEL
ALT: 9 U/L (ref 0–44)
AST: 20 U/L (ref 15–41)
Albumin: 2.1 g/dL — ABNORMAL LOW (ref 3.5–5.0)
Alkaline Phosphatase: 42 U/L (ref 38–126)
Anion gap: 12 (ref 5–15)
BUN: 12 mg/dL (ref 8–23)
CO2: 20 mmol/L — ABNORMAL LOW (ref 22–32)
Calcium: 7.5 mg/dL — ABNORMAL LOW (ref 8.9–10.3)
Chloride: 110 mmol/L (ref 98–111)
Creatinine, Ser: 1.07 mg/dL — ABNORMAL HIGH (ref 0.44–1.00)
GFR calc Af Amer: 53 mL/min — ABNORMAL LOW (ref >60)
GFR calc non Af Amer: 45 mL/min — ABNORMAL LOW (ref >60)
Glucose, Bld: 247 mg/dL — ABNORMAL HIGH (ref 70–99)
Potassium: 4 mmol/L (ref 3.5–5.1)
Sodium: 142 mmol/L (ref 135–145)
Total Bilirubin: 0.2 mg/dL — ABNORMAL LOW (ref 0.3–1.2)
Total Protein: 4.7 g/dL — ABNORMAL LOW (ref 6.5–8.1)

## 2018-08-04 LAB — I-STAT CHEM 8, ED
BUN: 13 mg/dL (ref 8–23)
CHLORIDE: 108 mmol/L (ref 98–111)
Calcium, Ion: 1.09 mmol/L — ABNORMAL LOW (ref 1.15–1.40)
Creatinine, Ser: 1 mg/dL (ref 0.44–1.00)
Glucose, Bld: 206 mg/dL — ABNORMAL HIGH (ref 70–99)
HCT: 26 % — ABNORMAL LOW (ref 36.0–46.0)
Hemoglobin: 8.8 g/dL — ABNORMAL LOW (ref 12.0–15.0)
Potassium: 4.5 mmol/L (ref 3.5–5.1)
Sodium: 143 mmol/L (ref 135–145)
TCO2: 24 mmol/L (ref 22–32)

## 2018-08-04 LAB — BRAIN NATRIURETIC PEPTIDE: B Natriuretic Peptide: 399 pg/mL — ABNORMAL HIGH (ref 0.0–100.0)

## 2018-08-04 LAB — I-STAT TROPONIN, ED: TROPONIN I, POC: 0.02 ng/mL (ref 0.00–0.08)

## 2018-08-04 LAB — TROPONIN I: Troponin I: 0.03 ng/mL (ref <0.03)

## 2018-08-04 MED ORDER — NALOXONE HCL 2 MG/2ML IJ SOSY
PREFILLED_SYRINGE | INTRAMUSCULAR | Status: AC | PRN
  Administered 2018-08-04: 1 mg via INTRAVENOUS

## 2018-08-04 MED ORDER — SODIUM BICARBONATE 8.4 % IV SOLN
INTRAVENOUS | Status: AC | PRN
  Administered 2018-08-04 (×2): 50 meq via INTRAVENOUS

## 2018-08-04 MED ORDER — SODIUM CHLORIDE 0.9 % IV BOLUS: 500.0000 mL | Freq: Once | INTRAVENOUS | Status: DC

## 2018-08-04 MED ORDER — EPINEPHRINE PF 1 MG/10ML IJ SOSY
PREFILLED_SYRINGE | INTRAMUSCULAR | Status: AC | PRN
  Administered 2018-08-04 (×3): 1 via INTRAVENOUS

## 2018-08-04 MED ORDER — NOREPINEPHRINE BITARTRATE 1 MG/ML IV SOLN
5.0000 ug/kg/min | Freq: Once | INTRAVENOUS | Status: DC
  Administered 2018-08-04: 5 ug/kg/min via INTRAVENOUS

## 2018-08-04 NOTE — ED Provider Notes (Signed)
Emergency Department Provider Note   I have reviewed the triage vital signs and the nursing notes.   HISTORY  Chief Complaint Cardiac Arrest   HPI Lynn Weaver is a 85 y.o. female with PMH of HTN, anemia, GI bleed, CHF, and PAF on Eliquis presents to the emergency department post-cardiac arrest.  EMS provides the history.  They state that they were called to the house for a "sick person."  They arrived on scene to find the fire department performing CPR.  They gave 3 epinephrine and perform CPR for approximately 20 minutes with ROSC.  The patient remained bradycardic in route with them with a weak pulse and hypotensive.   Past Medical History:  Diagnosis Date  . Anemia   . Diverticulosis   . GI bleed    diverticular/hemorrhoidal  . Hemorrhoids   . Hypertension     Patient Active Problem List   Diagnosis Date Noted  . AF (paroxysmal atrial fibrillation) ([place]) 01/25/2018  . Acute right arterial ischemic stroke, middle cerebral artery (MCA) ([place]) 01/25/2018  . Generalized weakness   . Goals of care, counseling/discussion   . Palliative care by specialist   . Dehydration 01/23/2018  . Chronic systolic CHF (congestive heart failure) ([place]) 01/23/2018  . Chronic combined systolic and diastolic CHF (congestive heart failure) ([place]) 01/23/2018  . Acute metabolic encephalopathy 14/78/2956  . Left hemiparesis ([place]) 01/23/2018  . Aspiration pneumonitis ([place]) 01/23/2018  . Dysphagia, idiopathic 09/12/2016  . Non-STEMI (non-ST elevated myocardial infarction) ([place]) 11/17/2013  . Acute systolic CHF (congestive heart failure) ([place]) 11/17/2013  . BRBPR (bright red blood per rectum) 06/23/2013  . Hypertension     Past Surgical History:  Procedure Laterality Date  . CATARACT EXTRACTION W/PHACO  05/29/2012   Procedure: CATARACT EXTRACTION PHACO AND INTRAOCULAR LENS PLACEMENT (IOC);  Surgeon: Tonny Branch, MD;  Location: [facility];  Service: Ophthalmology;  Laterality: Right;  CDE:18.72  .  CHOLECYSTECTOMY    . COLONOSCOPY N/A 06/24/2013   Significant diverticular disease. Large hemorrhoids. Terminal ileum normal. 2 rectosigmoid polyps (TA) removed. No old or fresh blood noted during exam. Likely had bleeding from diverticula and hemorrhoids. consider flex sig in 3 years.  Marland Kitchen LEFT AND RIGHT HEART CATHETERIZATION WITH CORONARY ANGIOGRAM N/A 11/23/2013   Procedure: LEFT AND RIGHT HEART CATHETERIZATION WITH CORONARY ANGIOGRAM;  Surgeon: Birdie Riddle, MD;  Location: [facility];  Service: Cardiovascular;  Laterality: N/A;   Allergies Fish allergy  Family History  Problem Relation Age of Onset  . Heart disease Father   . Colon cancer Sister        age 41    Social History Social History   Tobacco Use  . Smoking status: Never Smoker  . Smokeless tobacco: Never Used  Substance Use Topics  . Alcohol use: No  . Drug use: No    Review of Systems  Level 5 caveat: CPR  ____________________________________________   PHYSICAL EXAM:  VITAL SIGNS: Vitals:   08/03/2018 2041 08/09/2018 2042  BP: 94/80   Pulse: (!) 131   Resp: 14   SpO2: 100% 100%    Constitutional: Unresponsive.  Eyes: Conjunctivae are normal. Head: Atraumatic. Nose: No congestion/rhinnorhea. Mouth/Throat: Mucous membranes are moist.  Neck: No stridor.   Cardiovascular: Bradycardia with weak pulse. Cool extremities bilaterally.   Respiratory: King airway in place with good compliance. Diminished bilaterally.  Gastrointestinal: No distention.  Musculoskeletal:  No gross deformities of extremities. Neurologic: Unresponsive. Pupils are fixed and dilated.  Skin:  Skin is cool  and dry.   ____________________________________________   LABS (all labs ordered are listed, but only abnormal results are displayed)  Labs Reviewed  COMPREHENSIVE METABOLIC PANEL - Abnormal; Notable for the following components:      Result Value   CO2 20 (*)    Glucose, Bld 247 (*)    Creatinine, Ser 1.07 (*)    Calcium  7.5 (*)    Total Protein 4.7 (*)    Albumin 2.1 (*)    Total Bilirubin 0.2 (*)    GFR calc non Af Amer 45 (*)    GFR calc Af Amer 53 (*)    All other components within normal limits  BRAIN NATRIURETIC PEPTIDE - Abnormal; Notable for the following components:   B Natriuretic Peptide 399.0 (*)    All other components within normal limits  TROPONIN I - Abnormal; Notable for the following components:   Troponin I 0.03 (*)    All other components within normal limits  CBC WITH DIFFERENTIAL/PLATELET - Abnormal; Notable for the following components:   RBC 2.81 (*)    Hemoglobin 8.0 (*)    HCT 28.2 (*)    MCV 100.4 (*)    MCHC 28.4 (*)    nRBC 0.3 (*)    Neutro Abs 1.6 (*)    Lymphs Abs 6.8 (*)    All other components within normal limits  I-STAT CHEM 8, ED - Abnormal; Notable for the following components:   Glucose, Bld 206 (*)    Calcium, Ion 1.09 (*)    Hemoglobin 8.8 (*)    HCT 26.0 (*)    All other components within normal limits  URINALYSIS, ROUTINE W REFLEX MICROSCOPIC  I-STAT TROPONIN, ED  I-STAT CG4 LACTIC ACID, ED  I-STAT CG4 LACTIC ACID, ED   ____________________________________________  EKG   EKG Interpretation  Date/Time:  Monday August 04 2018 20:38:45 EST Ventricular Rate:  140 PR Interval:    QRS Duration: 143 QT Interval:  372 QTC Calculation: 568 R Axis:   -76 Text Interpretation:  Atrial fibrillation RBBB and LAFB LVH with secondary repolarization abnormality No STEMI. Similar to July 2019 tracing  Confirmed by Nanda Quinton [phone]) on 08/12/2018 8:43:43 PM       ____________________________________________  RADIOLOGY  None ____________________________________________   PROCEDURES  Procedure(s) performed:   Procedure Name: Intubation Date/Time: 08-09-2018 12:35 AM Performed by: Margette Fast, MD Pre-anesthesia Checklist: Patient identified, Emergency Drugs available, Suction available and Patient being monitored Preoxygenation:  Pre-oxygenation with 100% oxygen Ventilation: Oral airway inserted - appropriate to patient size Laryngoscope Size: Glidescope and 3 Grade View: Grade II Tube size: 7.5 mm Number of attempts: 1 Airway Equipment and Method: Video-laryngoscopy Placement Confirmation: ETT inserted through vocal cords under direct vision,  CO2 detector and Breath sounds checked- equal and bilateral Secured at: 23 cm Tube secured with: ETT holder Dental Injury: Teeth and Oropharynx as per pre-operative assessment        Cardiopulmonary Resuscitation (CPR) Procedure Note Directed/Performed by: Margette Fast I personally directed ancillary staff and/or performed CPR in an effort to regain return of spontaneous circulation and to maintain cardiac, neuro and systemic perfusion.    CRITICAL CARE Performed by: Margette Fast Total critical care time: 35 minutes Critical care time was exclusive of separately billable procedures and treating other patients. Critical care was necessary to treat or prevent imminent or life-threatening deterioration. Critical care was time spent personally by me on the following activities: development of treatment plan with patient and/or surrogate as  well as nursing, discussions with consultants, evaluation of patient's response to treatment, examination of patient, obtaining history from patient or surrogate, ordering and performing treatments and interventions, ordering and review of laboratory studies, ordering and review of radiographic studies, pulse oximetry and re-evaluation of patient's condition.  Nanda Quinton, MD Emergency Medicine  ____________________________________________   INITIAL IMPRESSION / ASSESSMENT AND PLAN / ED COURSE  Pertinent labs & imaging results that were available during my care of the patient were reviewed by me and considered in my medical decision making (see chart for details).  Patient arrives post-CPR. Bradycardia on arrival with weak  pulse. Poor air entry with bagging through [facility] airway. Airway replaced without sedation being used. No complication. Patient BP and Bradycardia worsened prior to EKG. CPR initiated with ROSC after several minutes. Starting NE infusion and will speak with family.   09:00 PM Patient has now had 2 episodes of cardiac arrest in the emergency department.  Family, the patient's nieces, are in the conference room.  I discussed the care to this point and that given her age and repeated episodes of cardiac arrest I do not feel she has a good prognosis.  She is not having any spontaneous movement.  I discussed that the patient did have a DNR status listed in July during her hospitalization.  They acknowledge this and state that the patient would not want any additional CPR.  They are not ready to completely withdraw the care already given but that other family members are in route.  09:15 PM Patient again lost pulses. No further intervention. Time of death 21:08. This does not seem appropriate for an ME case.  ____________________________________________  FINAL CLINICAL IMPRESSION(S) / ED DIAGNOSES  Final diagnoses:  Cardiac arrest ([place])     MEDICATIONS GIVEN DURING THIS VISIT:  Medications  sodium chloride 0.9 % bolus 500 mL (has no administration in time range)  EPINEPHrine (ADRENALIN) 1 MG/10ML injection (1 Syringe Intravenous Given 08/16/2018 2326)  sodium bicarbonate injection (50 mEq Intravenous Given 08/09/2018 2328)  naloxone [facility]) injection (1 mg Intravenous Given 08/10/2018 2027)    Note:  This document was prepared using Dragon voice recognition software and may include unintentional dictation errors.  Nanda Quinton, MD Emergency Medicine    Long, W[facility], MD 23-Aug-2018 [phone]

## 2018-08-04 NOTE — ED Triage Notes (Signed)
CPR in progress by first responders when [facility] arrived.  Narcan, 3 ept given en route.  Patient reprorted to be pulseless.

## 2018-08-04 NOTE — ED Notes (Signed)
Family found patient unresponsive and pulseless at home, brought in by EMS with CPR in progress with intubation.  Per EMS, patient was given 3 of Epinephrine, one of Narcan.  CPR began by [facility] staff 2026.  Family of patient made aware of code status, giving verbal permission to MD and staff to discontinue CPR if patient remains unresponsive.

## 2018-08-07 MED FILL — Medication: Qty: 1 | Status: AC

## 2018-08-23 DEATH — deceased

## 2019-04-10 IMAGING — CT CT ABD-PELV W/O CM
2 of 4 series · 15 of 46 positions shown, 17 images · non-contrast
Comparison: CT Abdomen and Pelvis 11/17/2013

CLINICAL DATA: [AGE] female with generalized weakness.
Abdominal pain. Initial encounter.

EXAM:
CT ABDOMEN AND PELVIS WITHOUT CONTRAST
TECHNIQUE: Multidetector CT imaging of the abdomen and pelvis was performed
following the standard protocol without IV contrast.

[Series 2: axial st · axial · 0.68mm/px · z∈[+1014,+1334]mm · 12 of 76 slices shown, 14 images]
[im 6/76  soft-tissue]
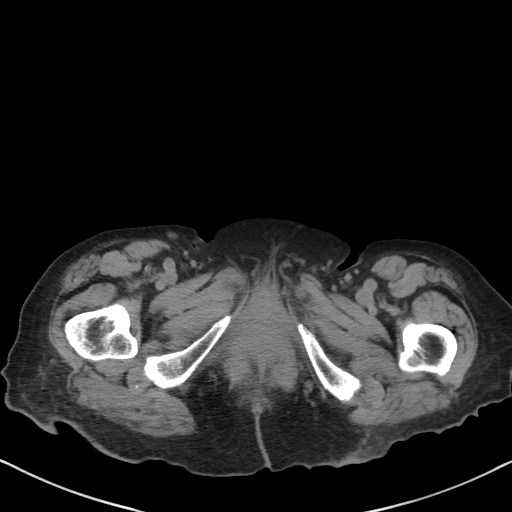
[im 6/76  bone]
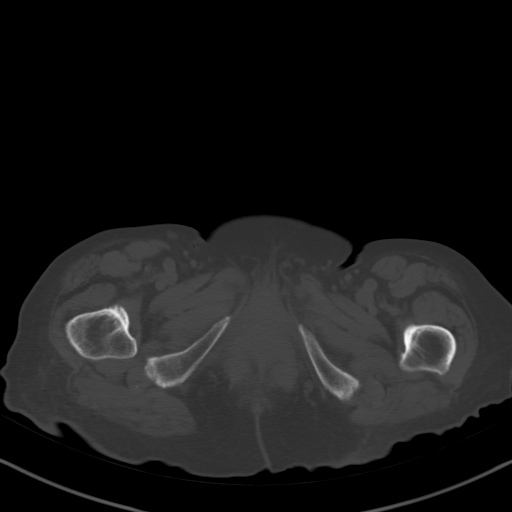
[im 12/76  soft-tissue]
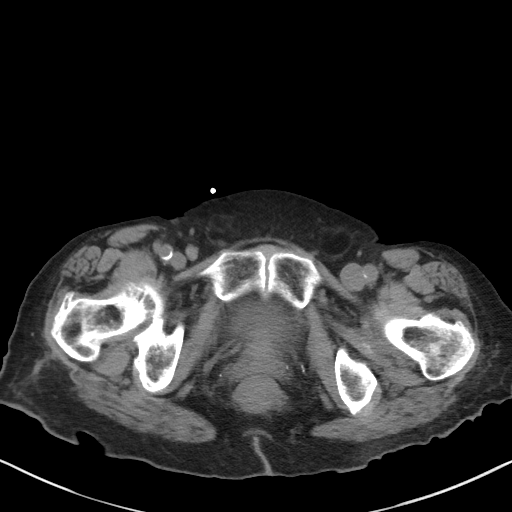
[im 18/76  soft-tissue]
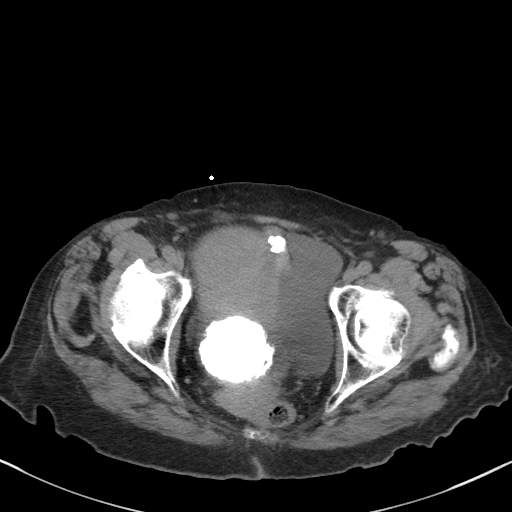
[im 24/76  soft-tissue]
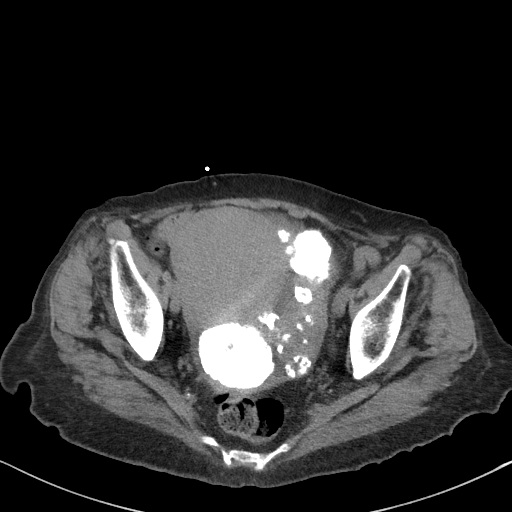
[im 29/76  soft-tissue]
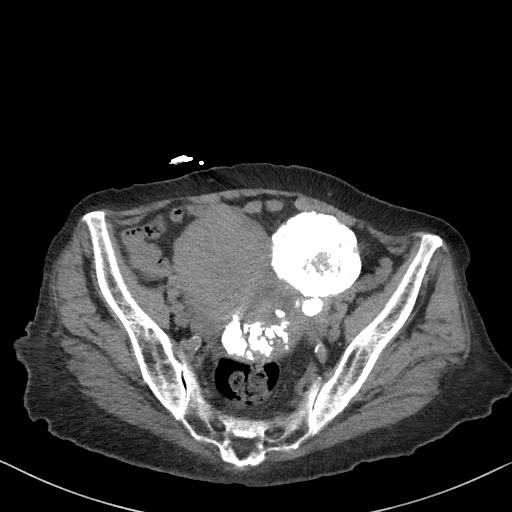
[im 35/76  soft-tissue]
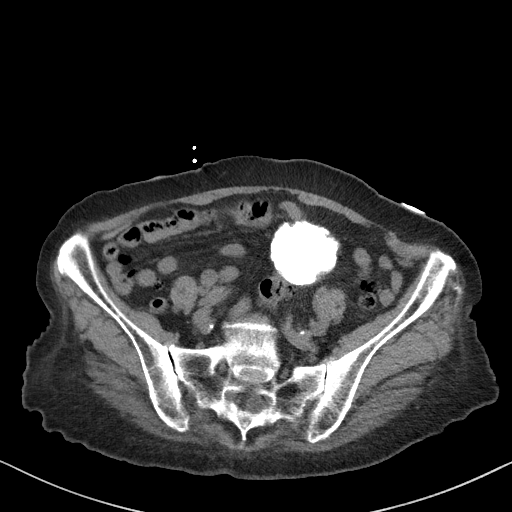
[im 41/76  soft-tissue]
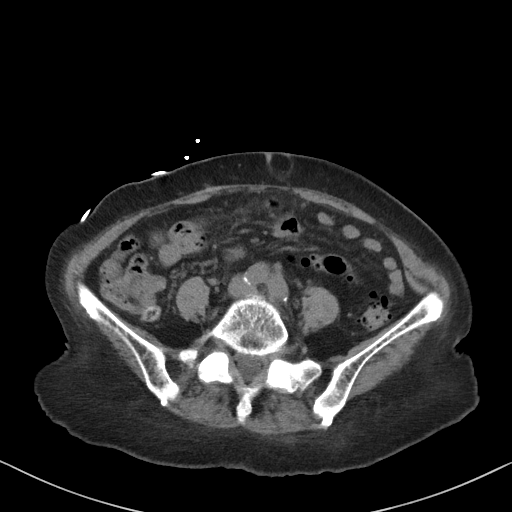
[im 47/76  soft-tissue]
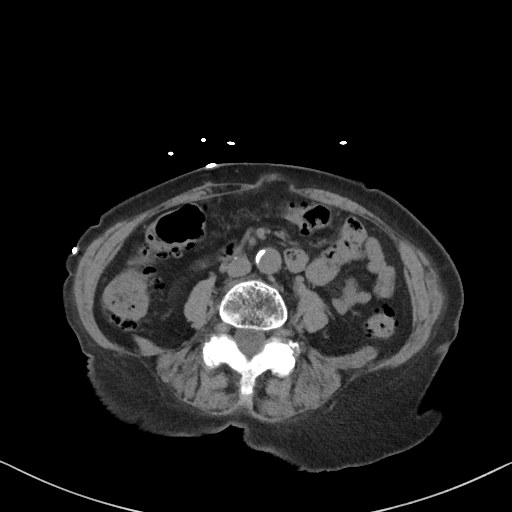
[im 52/76  soft-tissue]
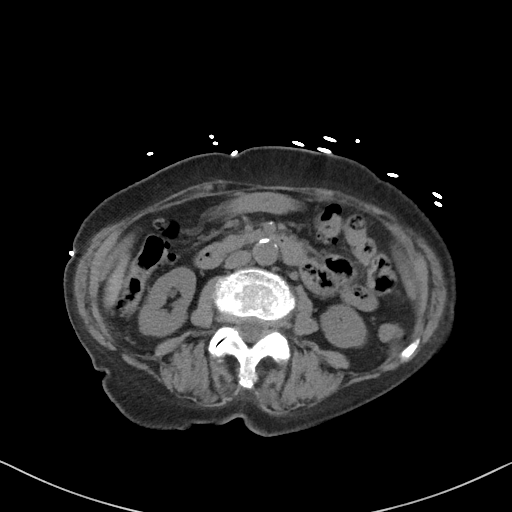
[im 52/76  bone]
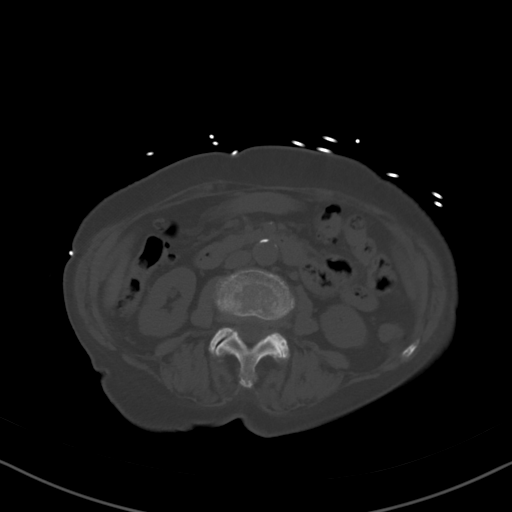
[im 58/76  soft-tissue]
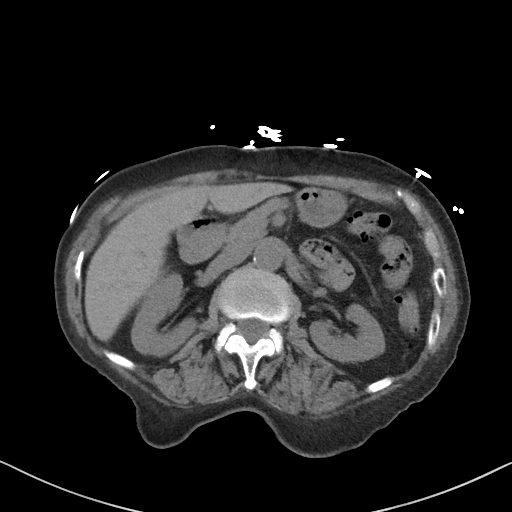
[im 64/76  soft-tissue]
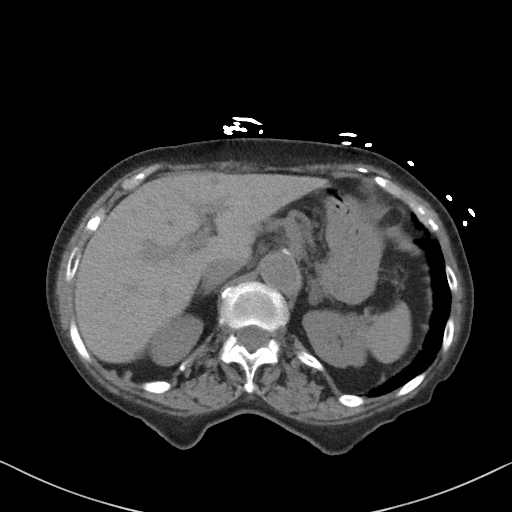
[im 70/76  soft-tissue]
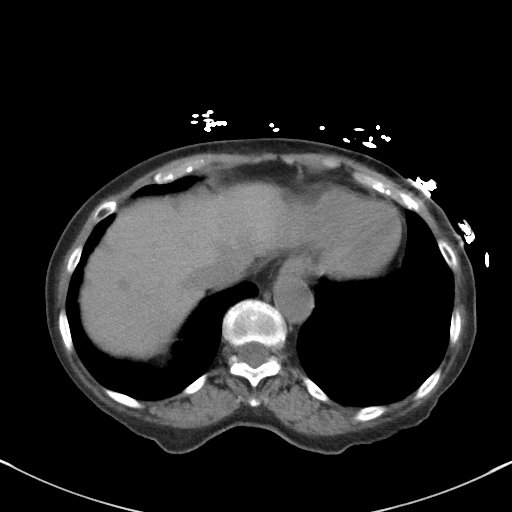

[Series 5: coronal st · coronal · 0.61mm/px · 3 of 80 slices shown]
[im 27/80  soft-tissue]
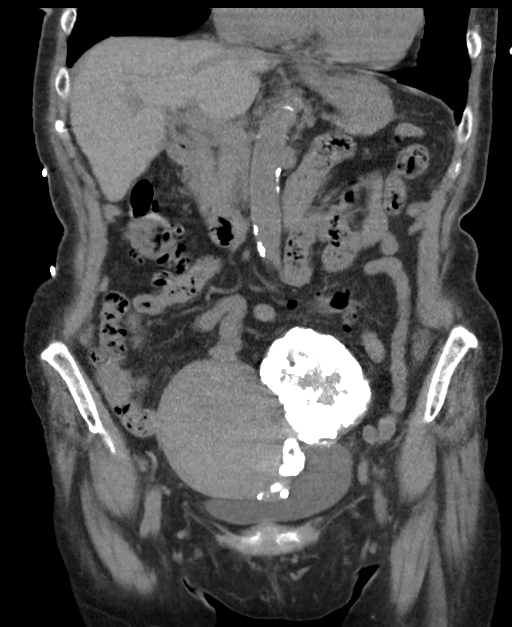
[im 36/80  soft-tissue]
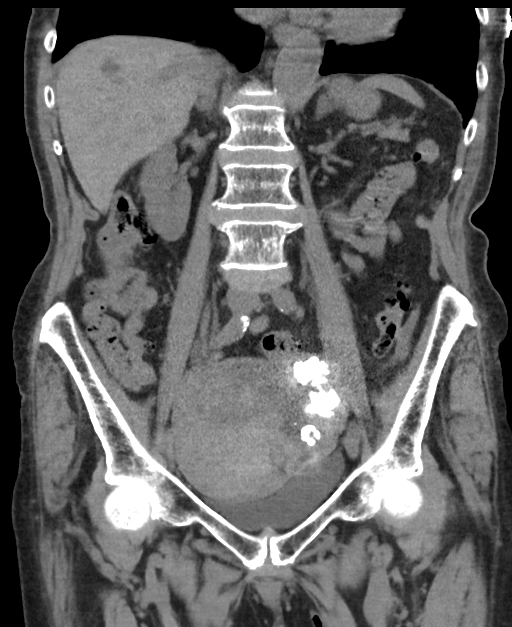
[im 44/80  soft-tissue]
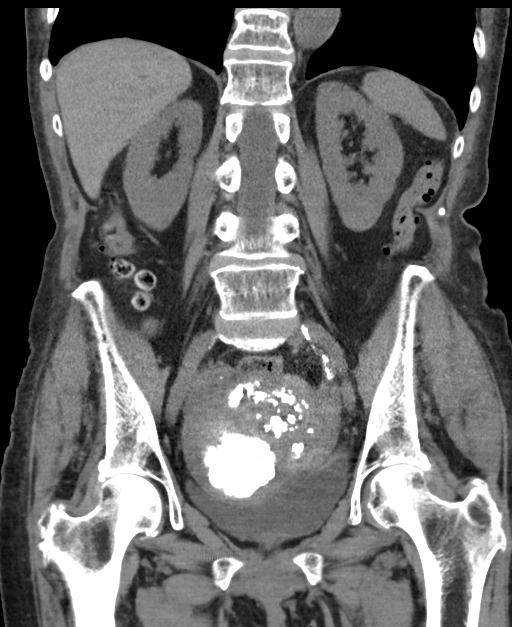

[15 of 46 positions shown; findings below may reference images not displayed]

FINDINGS: Lower chest: Chronic cardiomegaly. No pericardial effusion. Clear
lung bases today.

Hepatobiliary: Stable small hypodense areas scattered in the liver,
compatible with small benign cysts. Diminutive or absent
gallbladder.

Pancreas: Negative.

Spleen: Negative.

Adrenals/Urinary Tract: Stable mild adrenal gland thickening.

Negative noncontrast left kidney and left ureter.

Negative noncontrast right kidney and right ureter.

Distended but otherwise unremarkable urinary bladder.

Stomach/Bowel: Decompressed rectum. Mild to moderate diverticulosis
in the proximal sigmoid and left colon but no active inflammation
identified. Moderate to severe diverticulosis throughout the
transverse colon and at the flexures, more so on the right. Moderate
diverticulosis throughout the right colon. No active inflammation.
Appendix appears stable and within normal limits.

Negative terminal ileum. Decompressed small bowel throughout the
abdomen. Decompressed stomach and duodenum.

No abdominal free fluid.

Vascular/Lymphatic: Vascular patency is not evaluated in the absence
of IV contrast. Aortoiliac calcified atherosclerosis.

No lymphadenopathy.

Reproductive: Chronic bulky fibroid enlargement with numerous
confluent calcified uterine fibroids. As in 3509 the largest
individual fibroid is a noncalcified 8 cm diameter mass along the
right fundus. Individual calcified fibroids measure up to 6.6 cm
diameter. The appearance has not significantly changed since 3509.
Ovaries are not delineated.

Other: No pelvic free fluid. Small chronic fat containing umbilical
hernia is unchanged. Partial fatty atrophy of the right rectus
muscle is chronic.

Musculoskeletal: Osteopenia. No acute osseous abnormality
identified.
IMPRESSION: 1. No acute or inflammatory process identified in the noncontrast
abdomen or pelvis. No urologic calculus or obstructive uropathy.
2. Widespread diverticulosis of the large bowel with no active
inflammation to suggest diverticulitis.
3. Chronic bulky fibroid uterus. Numerous fibroids up to 8 cm
diameter, many densely calcified.
4.  Calcified aortic atherosclerosis.

## 2019-04-10 IMAGING — DX DG CHEST 2V
2 series · 2 of 2 positions shown · non-contrast
Comparison: Chest x-ray 01/18/2016 .

CLINICAL DATA: Generalized weakness.

EXAM:
CHEST  2 VIEW

[chest lat]
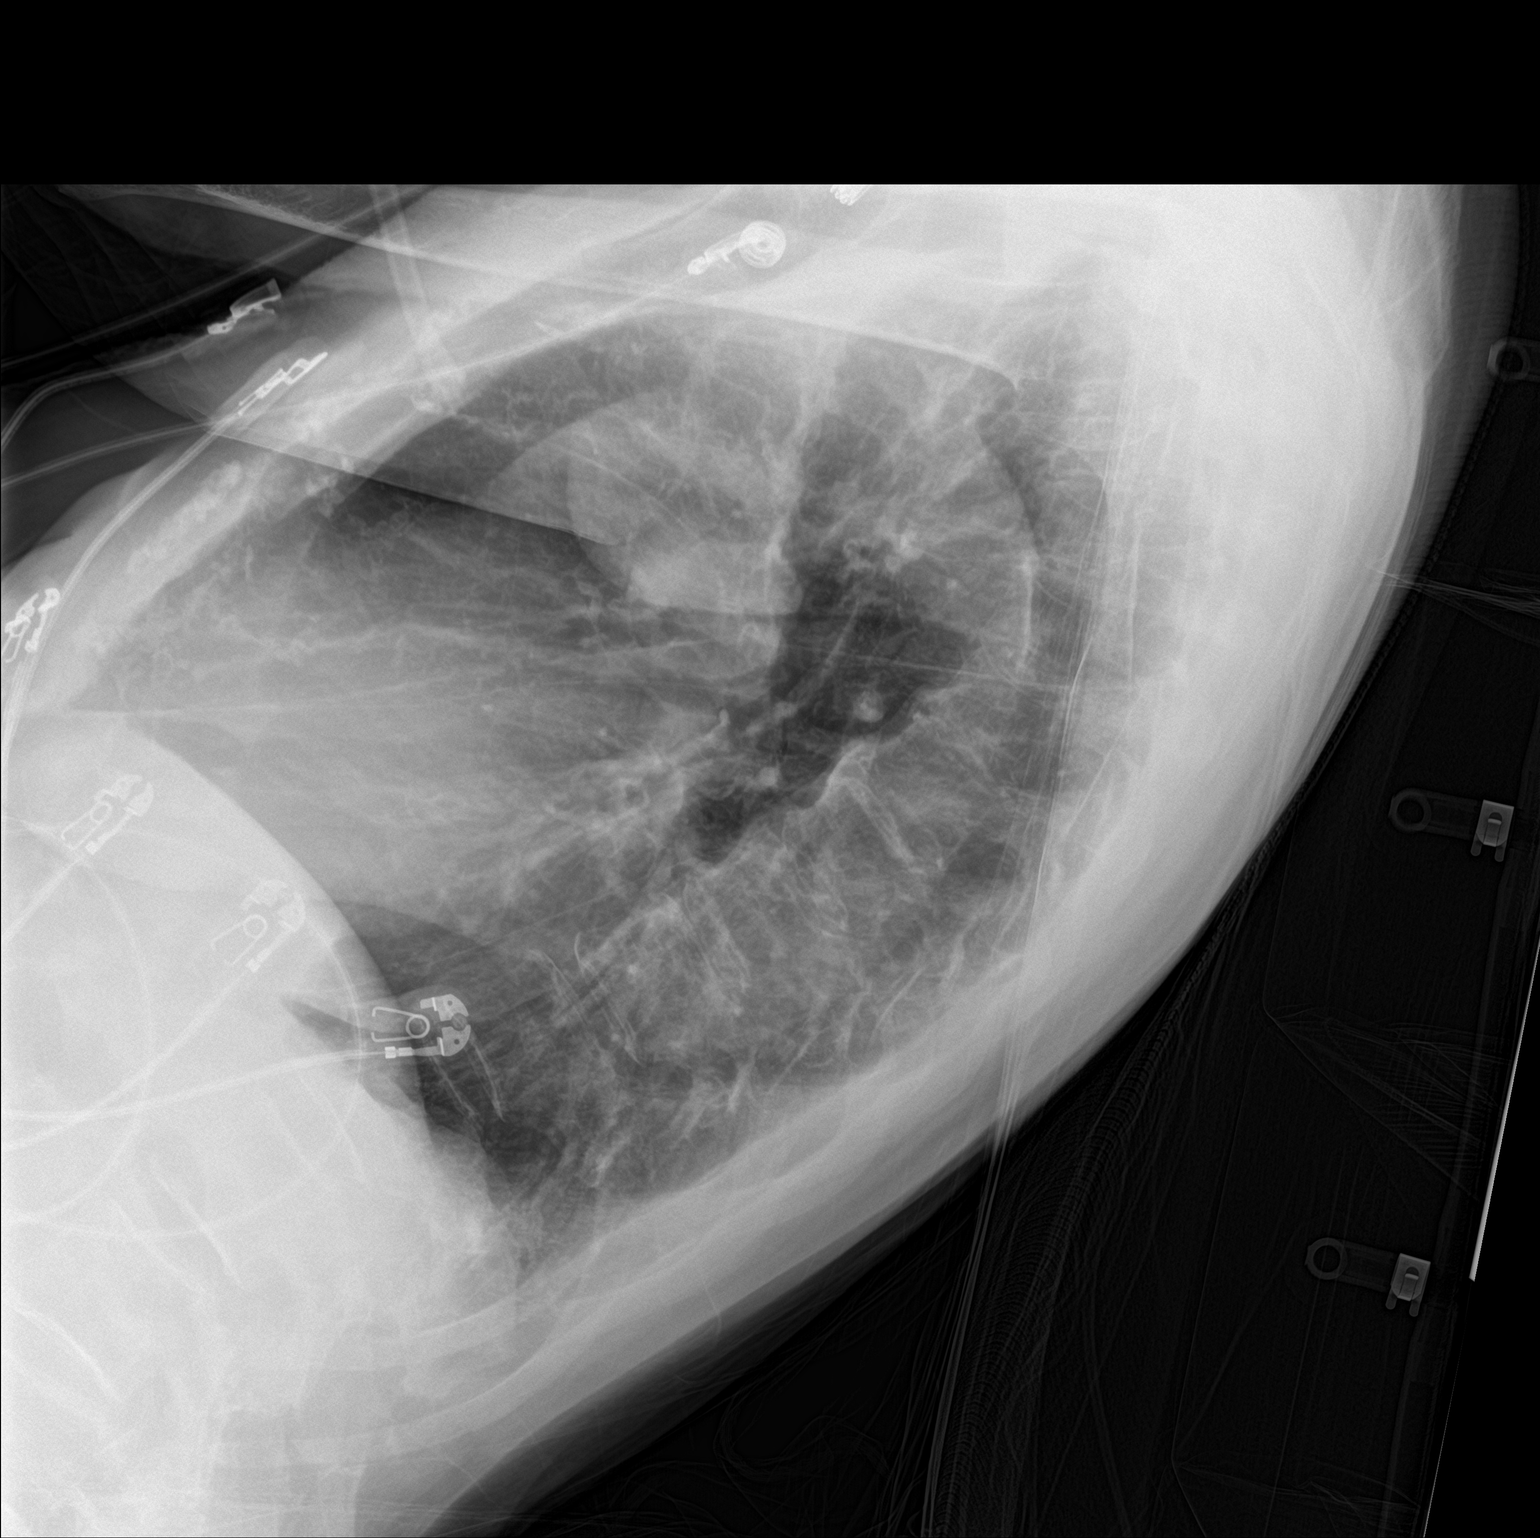

[chest ap]
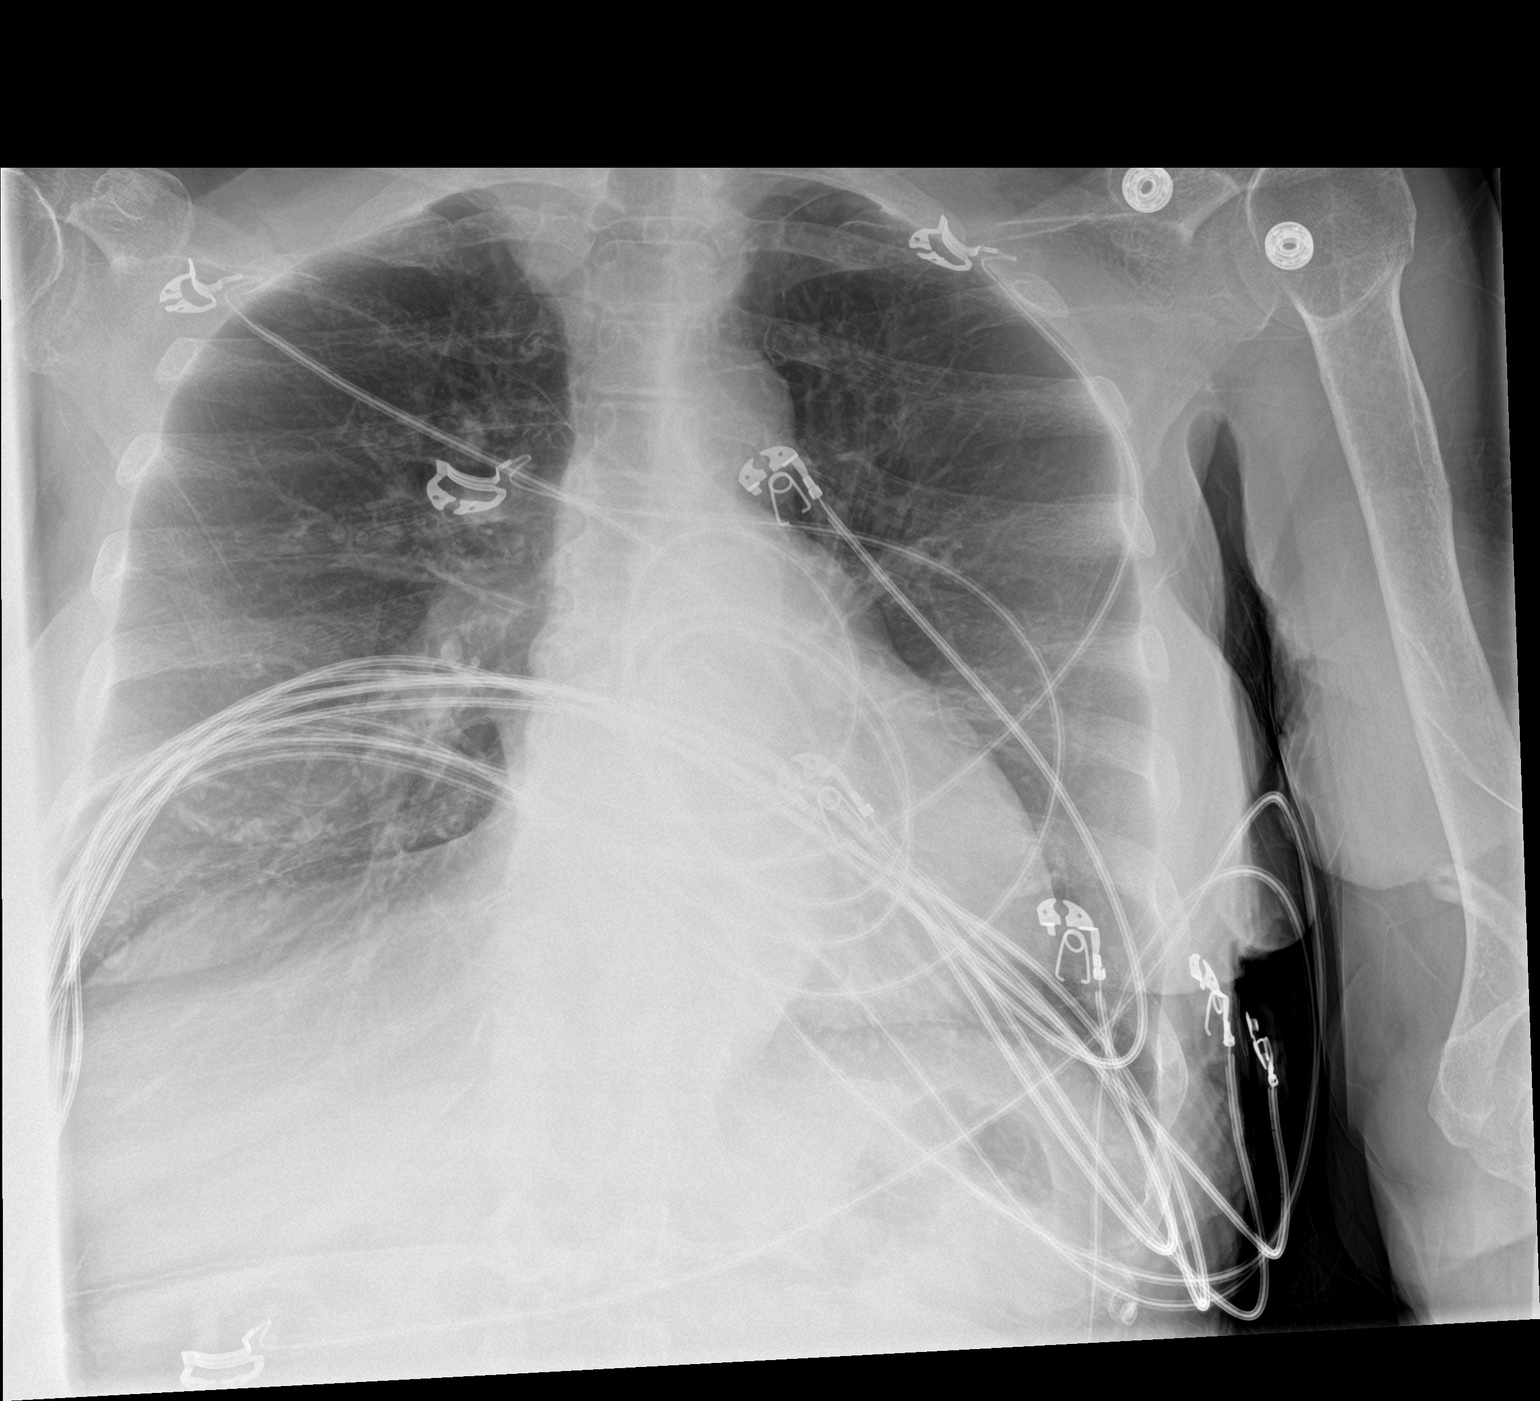

[2 of 2 positions shown; findings below may reference images not displayed]

FINDINGS: Right costophrenic angle incompletely imaged. Mediastinum and hilar
structures are normal. Lungs are clear. No pleural effusion or
pneumothorax. Heart size normal. Diffuse osteopenia and degenerative
changes thoracic spine.
IMPRESSION: No acute cardiopulmonary disease.
